# Patient Record
Sex: Female | Born: 1945 | Race: Black or African American | Hispanic: No | Marital: Single | Smoking: Current every day smoker
Health system: Southern US, Community
[De-identification: ages and names within clinical notes are randomized; demographics above are authoritative.]

## PROBLEM LIST (undated history)

## (undated) DIAGNOSIS — F17200 Nicotine dependence, unspecified, uncomplicated: Secondary | ICD-10-CM

## (undated) DIAGNOSIS — I10 Essential (primary) hypertension: Secondary | ICD-10-CM

## (undated) DIAGNOSIS — B159 Hepatitis A without hepatic coma: Secondary | ICD-10-CM

## (undated) DIAGNOSIS — R569 Unspecified convulsions: Secondary | ICD-10-CM

## (undated) DIAGNOSIS — F209 Schizophrenia, unspecified: Secondary | ICD-10-CM

## (undated) DIAGNOSIS — J45909 Unspecified asthma, uncomplicated: Secondary | ICD-10-CM

## (undated) DIAGNOSIS — E119 Type 2 diabetes mellitus without complications: Secondary | ICD-10-CM

## (undated) DIAGNOSIS — B1920 Unspecified viral hepatitis C without hepatic coma: Secondary | ICD-10-CM

## (undated) DIAGNOSIS — I509 Heart failure, unspecified: Secondary | ICD-10-CM

## (undated) DIAGNOSIS — J9690 Respiratory failure, unspecified, unspecified whether with hypoxia or hypercapnia: Secondary | ICD-10-CM

## (undated) DIAGNOSIS — B1910 Unspecified viral hepatitis B without hepatic coma: Secondary | ICD-10-CM

---

## 2001-10-05 ENCOUNTER — Emergency Department (HOSPITAL_COMMUNITY): Admission: EM | Admit: 2001-10-05 | Discharge: 2001-10-05 | Payer: Self-pay | Admitting: *Deleted

## 2003-01-24 ENCOUNTER — Encounter: Payer: Self-pay | Admitting: Emergency Medicine

## 2003-01-24 ENCOUNTER — Inpatient Hospital Stay (HOSPITAL_COMMUNITY): Admission: EM | Admit: 2003-01-24 | Discharge: 2003-02-01 | Payer: Self-pay | Admitting: Psychiatry

## 2003-03-10 ENCOUNTER — Inpatient Hospital Stay (HOSPITAL_COMMUNITY): Admission: AD | Admit: 2003-03-10 | Discharge: 2003-03-18 | Payer: Self-pay | Admitting: Psychiatry

## 2003-07-21 ENCOUNTER — Inpatient Hospital Stay (HOSPITAL_COMMUNITY): Admission: EM | Admit: 2003-07-21 | Discharge: 2003-08-01 | Payer: Self-pay | Admitting: Psychiatry

## 2003-08-08 ENCOUNTER — Emergency Department (HOSPITAL_COMMUNITY): Admission: EM | Admit: 2003-08-08 | Discharge: 2003-08-08 | Payer: Self-pay | Admitting: Emergency Medicine

## 2003-08-09 ENCOUNTER — Emergency Department (HOSPITAL_COMMUNITY): Admission: EM | Admit: 2003-08-09 | Discharge: 2003-08-09 | Payer: Self-pay | Admitting: *Deleted

## 2003-08-12 ENCOUNTER — Emergency Department (HOSPITAL_COMMUNITY): Admission: EM | Admit: 2003-08-12 | Discharge: 2003-08-13 | Payer: Self-pay | Admitting: *Deleted

## 2003-08-28 ENCOUNTER — Emergency Department (HOSPITAL_COMMUNITY): Admission: EM | Admit: 2003-08-28 | Discharge: 2003-08-28 | Payer: Self-pay | Admitting: Emergency Medicine

## 2003-08-31 ENCOUNTER — Emergency Department (HOSPITAL_COMMUNITY): Admission: EM | Admit: 2003-08-31 | Discharge: 2003-09-01 | Payer: Self-pay | Admitting: Emergency Medicine

## 2003-10-07 ENCOUNTER — Emergency Department (HOSPITAL_COMMUNITY): Admission: EM | Admit: 2003-10-07 | Discharge: 2003-10-07 | Payer: Self-pay | Admitting: Emergency Medicine

## 2003-11-10 ENCOUNTER — Emergency Department (HOSPITAL_COMMUNITY): Admission: EM | Admit: 2003-11-10 | Discharge: 2003-11-10 | Payer: Self-pay | Admitting: Emergency Medicine

## 2003-11-11 ENCOUNTER — Emergency Department (HOSPITAL_COMMUNITY): Admission: AD | Admit: 2003-11-11 | Discharge: 2003-11-11 | Payer: Self-pay | Admitting: Emergency Medicine

## 2004-01-11 ENCOUNTER — Emergency Department (HOSPITAL_COMMUNITY): Admission: EM | Admit: 2004-01-11 | Discharge: 2004-01-12 | Payer: Self-pay | Admitting: Emergency Medicine

## 2004-01-13 ENCOUNTER — Emergency Department (HOSPITAL_COMMUNITY): Admission: EM | Admit: 2004-01-13 | Discharge: 2004-01-13 | Payer: Self-pay | Admitting: Emergency Medicine

## 2004-01-14 ENCOUNTER — Emergency Department (HOSPITAL_COMMUNITY): Admission: EM | Admit: 2004-01-14 | Discharge: 2004-01-14 | Payer: Self-pay | Admitting: Emergency Medicine

## 2004-01-25 ENCOUNTER — Emergency Department (HOSPITAL_COMMUNITY): Admission: EM | Admit: 2004-01-25 | Discharge: 2004-01-25 | Payer: Self-pay | Admitting: Emergency Medicine

## 2004-03-24 ENCOUNTER — Emergency Department (HOSPITAL_COMMUNITY): Admission: EM | Admit: 2004-03-24 | Discharge: 2004-03-24 | Payer: Self-pay | Admitting: Emergency Medicine

## 2004-07-05 ENCOUNTER — Emergency Department: Payer: Self-pay | Admitting: Emergency Medicine

## 2004-07-19 ENCOUNTER — Emergency Department: Payer: Self-pay | Admitting: Emergency Medicine

## 2004-08-05 ENCOUNTER — Emergency Department: Payer: Self-pay | Admitting: Emergency Medicine

## 2006-01-16 ENCOUNTER — Emergency Department: Payer: Self-pay | Admitting: Emergency Medicine

## 2006-01-17 ENCOUNTER — Emergency Department: Payer: Self-pay | Admitting: Emergency Medicine

## 2006-01-18 ENCOUNTER — Emergency Department: Payer: Self-pay | Admitting: Emergency Medicine

## 2006-05-03 ENCOUNTER — Inpatient Hospital Stay: Payer: Self-pay | Admitting: Psychiatry

## 2006-05-19 ENCOUNTER — Emergency Department: Payer: Self-pay | Admitting: Internal Medicine

## 2006-07-29 ENCOUNTER — Emergency Department: Payer: Self-pay | Admitting: Emergency Medicine

## 2006-07-30 ENCOUNTER — Emergency Department: Payer: Self-pay | Admitting: Emergency Medicine

## 2006-08-01 ENCOUNTER — Emergency Department: Payer: Self-pay | Admitting: Emergency Medicine

## 2006-08-01 ENCOUNTER — Other Ambulatory Visit: Payer: Self-pay

## 2006-08-04 ENCOUNTER — Other Ambulatory Visit: Payer: Self-pay

## 2006-08-04 ENCOUNTER — Emergency Department: Payer: Self-pay | Admitting: Emergency Medicine

## 2013-01-28 ENCOUNTER — Ambulatory Visit: Payer: Self-pay | Admitting: Internal Medicine

## 2013-02-13 ENCOUNTER — Inpatient Hospital Stay: Payer: Self-pay | Admitting: Family Medicine

## 2013-02-13 LAB — CBC WITH DIFFERENTIAL/PLATELET
Basophil %: 1.4 %
Eosinophil #: 0.1 10*3/uL (ref 0.0–0.7)
HCT: 40.9 % (ref 35.0–47.0)
Lymphocyte #: 1.7 10*3/uL (ref 1.0–3.6)
Lymphocyte %: 41.4 %
MCH: 34.5 pg — ABNORMAL HIGH (ref 26.0–34.0)
MCHC: 33.4 g/dL (ref 32.0–36.0)
MCV: 103 fL — ABNORMAL HIGH (ref 80–100)
Monocyte %: 5.6 %
Neutrophil #: 2 10*3/uL (ref 1.4–6.5)
Neutrophil %: 49.6 %
RBC: 3.97 10*6/uL (ref 3.80–5.20)
RDW: 13.9 % (ref 11.5–14.5)
WBC: 4 10*3/uL (ref 3.6–11.0)

## 2013-02-13 LAB — DRUG SCREEN, URINE
Amphetamines, Ur Screen: NEGATIVE (ref ?–1000)
Barbiturates, Ur Screen: NEGATIVE (ref ?–200)
Cannabinoid 50 Ng, Ur ~~LOC~~: NEGATIVE (ref ?–50)
MDMA (Ecstasy)Ur Screen: NEGATIVE (ref ?–500)
Phencyclidine (PCP) Ur S: NEGATIVE (ref ?–25)
Tricyclic, Ur Screen: NEGATIVE (ref ?–1000)

## 2013-02-13 LAB — COMPREHENSIVE METABOLIC PANEL
Albumin: 2.8 g/dL — ABNORMAL LOW (ref 3.4–5.0)
Alkaline Phosphatase: 73 U/L (ref 50–136)
Anion Gap: 14 (ref 7–16)
Bilirubin,Total: 0.8 mg/dL (ref 0.2–1.0)
Calcium, Total: 8.6 mg/dL (ref 8.5–10.1)
Co2: 22 mmol/L (ref 21–32)
Creatinine: 2.14 mg/dL — ABNORMAL HIGH (ref 0.60–1.30)
EGFR (African American): 27 — ABNORMAL LOW
EGFR (Non-African Amer.): 24 — ABNORMAL LOW
Osmolality: 297 (ref 275–301)
Potassium: 5.3 mmol/L — ABNORMAL HIGH (ref 3.5–5.1)
SGOT(AST): 416 U/L — ABNORMAL HIGH (ref 15–37)
Sodium: 137 mmol/L (ref 136–145)

## 2013-02-13 LAB — BASIC METABOLIC PANEL
Anion Gap: 21 — ABNORMAL HIGH (ref 7–16)
BUN: 56 mg/dL — ABNORMAL HIGH (ref 7–18)
Chloride: 94 mmol/L — ABNORMAL LOW (ref 98–107)
Co2: 12 mmol/L — ABNORMAL LOW (ref 21–32)
Creatinine: 2.06 mg/dL — ABNORMAL HIGH (ref 0.60–1.30)
EGFR (Non-African Amer.): 25 — ABNORMAL LOW
Glucose: 698 mg/dL (ref 65–99)
Potassium: 4.2 mmol/L (ref 3.5–5.1)
Sodium: 127 mmol/L — ABNORMAL LOW (ref 136–145)

## 2013-02-13 LAB — URINALYSIS, COMPLETE
Bacteria: NONE SEEN
Bilirubin,UR: NEGATIVE
Blood: NEGATIVE
Glucose,UR: NEGATIVE mg/dL (ref 0–75)
Hyaline Cast: 1
Ph: 5 (ref 4.5–8.0)
Protein: NEGATIVE
WBC UR: <1 /HPF (ref 0–5)

## 2013-02-13 LAB — TROPONIN I: Troponin-I: <0.02 ng/mL

## 2013-02-13 LAB — TSH: Thyroid Stimulating Horm: 2.15 u[IU]/mL

## 2013-02-14 DIAGNOSIS — I369 Nonrheumatic tricuspid valve disorder, unspecified: Secondary | ICD-10-CM

## 2013-02-14 LAB — CBC WITH DIFFERENTIAL/PLATELET
Basophil #: 0 10*3/uL (ref 0.0–0.1)
Basophil %: 0.1 %
Eosinophil #: 0 10*3/uL (ref 0.0–0.7)
HGB: 13.2 g/dL (ref 12.0–16.0)
Lymphocyte #: 0.5 10*3/uL — ABNORMAL LOW (ref 1.0–3.6)
Lymphocyte %: 10.4 %
MCH: 34 pg (ref 26.0–34.0)
MCHC: 32 g/dL (ref 32.0–36.0)
Monocyte #: 0.3 x10 3/mm (ref 0.2–0.9)
Neutrophil %: 83 %
RBC: 3.9 10*6/uL (ref 3.80–5.20)
RDW: 14 % (ref 11.5–14.5)
WBC: 4.8 10*3/uL (ref 3.6–11.0)

## 2013-02-14 LAB — COMPREHENSIVE METABOLIC PANEL
Anion Gap: 18 — ABNORMAL HIGH (ref 7–16)
BUN: 60 mg/dL — ABNORMAL HIGH (ref 7–18)
Bilirubin,Total: 0.7 mg/dL (ref 0.2–1.0)
Calcium, Total: 6.3 mg/dL — CL (ref 8.5–10.1)
Chloride: 89 mmol/L — ABNORMAL LOW (ref 98–107)
Creatinine: 2 mg/dL — ABNORMAL HIGH (ref 0.60–1.30)
EGFR (African American): 30 — ABNORMAL LOW
Osmolality: 310 (ref 275–301)
Potassium: 3.5 mmol/L (ref 3.5–5.1)
SGPT (ALT): 139 U/L — ABNORMAL HIGH (ref 12–78)
Sodium: 124 mmol/L — ABNORMAL LOW (ref 136–145)

## 2013-02-14 LAB — LIPID PANEL: Cholesterol: 120 mg/dL (ref 0–200)

## 2013-02-14 LAB — BASIC METABOLIC PANEL
BUN: 45 mg/dL — ABNORMAL HIGH (ref 7–18)
Calcium, Total: 6.5 mg/dL — CL (ref 8.5–10.1)
Chloride: 87 mmol/L — ABNORMAL LOW (ref 98–107)
Co2: 27 mmol/L (ref 21–32)
Creatinine: 1.42 mg/dL — ABNORMAL HIGH (ref 0.60–1.30)
EGFR (African American): 45 — ABNORMAL LOW
Glucose: 194 mg/dL — ABNORMAL HIGH (ref 65–99)
Potassium: 3.1 mmol/L — ABNORMAL LOW (ref 3.5–5.1)

## 2013-02-14 LAB — HEMOGLOBIN A1C: Hemoglobin A1C: 6.7 % — ABNORMAL HIGH (ref 4.2–6.3)

## 2013-02-14 LAB — GLUCOSE, RANDOM
Glucose: 862 mg/dL (ref 65–99)
Glucose: 730 mg/dL (ref 65–99)

## 2013-02-14 LAB — PHOSPHORUS: Phosphorus: 2.3 mg/dL — ABNORMAL LOW (ref 2.5–4.9)

## 2013-02-14 LAB — MAGNESIUM: Magnesium: 0.7 mg/dL — ABNORMAL LOW

## 2013-02-14 LAB — VALPROIC ACID LEVEL: Valproic Acid: 9 ug/mL — ABNORMAL LOW

## 2013-02-15 LAB — COMPREHENSIVE METABOLIC PANEL
Alkaline Phosphatase: 54 U/L (ref 50–136)
Anion Gap: 10 (ref 7–16)
BUN: 37 mg/dL — ABNORMAL HIGH (ref 7–18)
Calcium, Total: 7 mg/dL — CL (ref 8.5–10.1)
Co2: 28 mmol/L (ref 21–32)
Creatinine: 0.95 mg/dL (ref 0.60–1.30)
EGFR (Non-African Amer.): >60
Potassium: 2.6 mmol/L — ABNORMAL LOW (ref 3.5–5.1)
SGOT(AST): 184 U/L — ABNORMAL HIGH (ref 15–37)
SGPT (ALT): 95 U/L — ABNORMAL HIGH (ref 12–78)
Sodium: 124 mmol/L — ABNORMAL LOW (ref 136–145)
Total Protein: 4.7 g/dL — ABNORMAL LOW (ref 6.4–8.2)

## 2013-02-15 LAB — MAGNESIUM: Magnesium: 1.2 mg/dL — ABNORMAL LOW

## 2013-02-15 LAB — CBC WITH DIFFERENTIAL/PLATELET
Basophil #: 0 10*3/uL (ref 0.0–0.1)
Basophil %: 0.2 %
Eosinophil %: 0.1 %
HGB: 10.9 g/dL — ABNORMAL LOW (ref 12.0–16.0)
Lymphocyte #: 1.3 10*3/uL (ref 1.0–3.6)
Lymphocyte %: 17.4 %
MCH: 34.4 pg — ABNORMAL HIGH (ref 26.0–34.0)
MCV: 99 fL (ref 80–100)
Monocyte #: 0.7 x10 3/mm (ref 0.2–0.9)
Neutrophil #: 5.2 10*3/uL (ref 1.4–6.5)
Neutrophil %: 72.1 %
Platelet: 121 10*3/uL — ABNORMAL LOW (ref 150–440)
RBC: 3.16 10*6/uL — ABNORMAL LOW (ref 3.80–5.20)
RDW: 13.6 % (ref 11.5–14.5)

## 2013-02-15 LAB — PHOSPHORUS: Phosphorus: 1.9 mg/dL — ABNORMAL LOW (ref 2.5–4.9)

## 2013-02-15 LAB — URINE CULTURE

## 2013-02-16 LAB — CBC WITH DIFFERENTIAL/PLATELET
Eosinophil #: 0 10*3/uL (ref 0.0–0.7)
Eosinophil %: 0.1 %
HCT: 29.7 % — ABNORMAL LOW (ref 35.0–47.0)
Lymphocyte #: 1.1 10*3/uL (ref 1.0–3.6)
Lymphocyte %: 16.3 %
MCV: 99 fL (ref 80–100)
Monocyte #: 0.5 x10 3/mm (ref 0.2–0.9)
Monocyte %: 6.8 %
Neutrophil %: 76.6 %
Platelet: 121 10*3/uL — ABNORMAL LOW (ref 150–440)
RDW: 13.4 % (ref 11.5–14.5)

## 2013-02-16 LAB — BASIC METABOLIC PANEL
Anion Gap: 9 (ref 7–16)
BUN: 30 mg/dL — ABNORMAL HIGH (ref 7–18)
Calcium, Total: 7.3 mg/dL — ABNORMAL LOW (ref 8.5–10.1)
Chloride: 88 mmol/L — ABNORMAL LOW (ref 98–107)
Co2: 27 mmol/L (ref 21–32)
Creatinine: 0.78 mg/dL (ref 0.60–1.30)
EGFR (African American): >60
EGFR (Non-African Amer.): >60
Osmolality: 262 (ref 275–301)
Sodium: 124 mmol/L — ABNORMAL LOW (ref 136–145)

## 2013-02-16 LAB — MAGNESIUM: Magnesium: 1.2 mg/dL — ABNORMAL LOW

## 2013-02-16 LAB — PHOSPHORUS: Phosphorus: 1.7 mg/dL — ABNORMAL LOW (ref 2.5–4.9)

## 2013-02-17 LAB — BASIC METABOLIC PANEL
Anion Gap: 9 (ref 7–16)
BUN: 21 mg/dL — ABNORMAL HIGH (ref 7–18)
Chloride: 105 mmol/L (ref 98–107)
Co2: 27 mmol/L (ref 21–32)
Creatinine: 0.78 mg/dL (ref 0.60–1.30)
EGFR (Non-African Amer.): >60
Glucose: 156 mg/dL — ABNORMAL HIGH (ref 65–99)
Potassium: 2.8 mmol/L — ABNORMAL LOW (ref 3.5–5.1)
Sodium: 141 mmol/L (ref 136–145)

## 2013-02-17 LAB — CBC WITH DIFFERENTIAL/PLATELET
Basophil %: 0.2 %
Eosinophil #: 0 10*3/uL (ref 0.0–0.7)
Eosinophil %: 0.1 %
HGB: 11 g/dL — ABNORMAL LOW (ref 12.0–16.0)
Lymphocyte #: 1 10*3/uL (ref 1.0–3.6)
Lymphocyte %: 11.7 %
MCHC: 35.2 g/dL (ref 32.0–36.0)
Monocyte %: 6 %
Neutrophil #: 7.3 10*3/uL — ABNORMAL HIGH (ref 1.4–6.5)
RDW: 13.7 % (ref 11.5–14.5)

## 2013-02-17 LAB — MAGNESIUM: Magnesium: 1.9 mg/dL

## 2013-02-17 LAB — EXPECTORATED SPUTUM ASSESSMENT W GRAM STAIN, RFLX TO RESP C

## 2013-02-17 LAB — PHOSPHORUS: Phosphorus: 0.9 mg/dL — CL (ref 2.5–4.9)

## 2013-02-18 LAB — CBC WITH DIFFERENTIAL/PLATELET
Comment - H1-Com3: NORMAL
HGB: 10.4 g/dL — ABNORMAL LOW (ref 12.0–16.0)
Lymphocytes: 34 %
MCHC: 35.1 g/dL (ref 32.0–36.0)
MCV: 100 fL (ref 80–100)
Platelet: 159 10*3/uL (ref 150–440)
RBC: 2.96 10*6/uL — ABNORMAL LOW (ref 3.80–5.20)
RDW: 14.3 % (ref 11.5–14.5)
WBC: 7.5 10*3/uL (ref 3.6–11.0)

## 2013-02-18 LAB — COMPREHENSIVE METABOLIC PANEL
Alkaline Phosphatase: 83 U/L (ref 50–136)
Anion Gap: 7 (ref 7–16)
BUN: 22 mg/dL — ABNORMAL HIGH (ref 7–18)
Co2: 28 mmol/L (ref 21–32)
Creatinine: 0.7 mg/dL (ref 0.60–1.30)
EGFR (Non-African Amer.): >60
Osmolality: 299 (ref 275–301)
Potassium: 3.4 mmol/L — ABNORMAL LOW (ref 3.5–5.1)
Sodium: 147 mmol/L — ABNORMAL HIGH (ref 136–145)
Total Protein: 4.9 g/dL — ABNORMAL LOW (ref 6.4–8.2)

## 2013-02-18 LAB — MAGNESIUM
Magnesium: 1.5 mg/dL — ABNORMAL LOW
Magnesium: 1.5 mg/dL — ABNORMAL LOW

## 2013-02-18 LAB — PHOSPHORUS
Phosphorus: 1.5 mg/dL — ABNORMAL LOW (ref 2.5–4.9)
Phosphorus: 1.7 mg/dL — ABNORMAL LOW (ref 2.5–4.9)
Phosphorus: 3.4 mg/dL (ref 2.5–4.9)

## 2013-02-18 LAB — POTASSIUM
Potassium: 3.4 mmol/L — ABNORMAL LOW (ref 3.5–5.1)
Potassium: 4.4 mmol/L (ref 3.5–5.1)

## 2013-02-19 LAB — BASIC METABOLIC PANEL
Anion Gap: 5 — ABNORMAL LOW (ref 7–16)
BUN: 23 mg/dL — ABNORMAL HIGH (ref 7–18)
Calcium, Total: 7.6 mg/dL — ABNORMAL LOW (ref 8.5–10.1)
Chloride: 113 mmol/L — ABNORMAL HIGH (ref 98–107)
Co2: 29 mmol/L (ref 21–32)
Creatinine: 0.66 mg/dL (ref 0.60–1.30)
Glucose: 152 mg/dL — ABNORMAL HIGH (ref 65–99)
Potassium: 4.2 mmol/L (ref 3.5–5.1)

## 2013-02-19 LAB — CULTURE, BLOOD (SINGLE)

## 2013-02-19 LAB — CBC WITH DIFFERENTIAL/PLATELET
Basophil #: 0 10*3/uL (ref 0.0–0.1)
Basophil %: 0.3 %
Eosinophil %: 1.1 %
HCT: 27 % — ABNORMAL LOW (ref 35.0–47.0)
HGB: 9.3 g/dL — ABNORMAL LOW (ref 12.0–16.0)
Lymphocyte %: 41 %
MCH: 35 pg — ABNORMAL HIGH (ref 26.0–34.0)
Monocyte #: 0.9 x10 3/mm (ref 0.2–0.9)
Monocyte %: 13.8 %
RBC: 2.65 10*6/uL — ABNORMAL LOW (ref 3.80–5.20)
WBC: 6.6 10*3/uL (ref 3.6–11.0)

## 2013-02-19 LAB — ALBUMIN: Albumin: 1.9 g/dL — ABNORMAL LOW (ref 3.4–5.0)

## 2013-02-19 LAB — PHOSPHORUS: Phosphorus: 2.7 mg/dL (ref 2.5–4.9)

## 2013-02-20 LAB — CBC WITH DIFFERENTIAL/PLATELET
HGB: 9.3 g/dL — ABNORMAL LOW (ref 12.0–16.0)
Lymphocytes: 42 %
MCHC: 34.1 g/dL (ref 32.0–36.0)
MCV: 101 fL — ABNORMAL HIGH (ref 80–100)
Monocytes: 7 %
Myelocyte: 1 %
RBC: 2.68 10*6/uL — ABNORMAL LOW (ref 3.80–5.20)
WBC: 9.1 10*3/uL (ref 3.6–11.0)

## 2013-02-20 LAB — BASIC METABOLIC PANEL
Anion Gap: 6 — ABNORMAL LOW (ref 7–16)
BUN: 20 mg/dL — ABNORMAL HIGH (ref 7–18)
Calcium, Total: 8 mg/dL — ABNORMAL LOW (ref 8.5–10.1)
Co2: 28 mmol/L (ref 21–32)
Creatinine: 0.66 mg/dL (ref 0.60–1.30)
EGFR (African American): >60

## 2013-02-20 LAB — PHENYTOIN LEVEL, TOTAL: Dilantin: 13.5 ug/mL (ref 10.0–20.0)

## 2013-02-21 DIAGNOSIS — I498 Other specified cardiac arrhythmias: Secondary | ICD-10-CM

## 2013-02-21 LAB — BASIC METABOLIC PANEL
Anion Gap: 4 — ABNORMAL LOW (ref 7–16)
Calcium, Total: 7.9 mg/dL — ABNORMAL LOW (ref 8.5–10.1)
Chloride: 109 mmol/L — ABNORMAL HIGH (ref 98–107)
Creatinine: 0.6 mg/dL (ref 0.60–1.30)
EGFR (African American): >60
EGFR (Non-African Amer.): >60
Glucose: 135 mg/dL — ABNORMAL HIGH (ref 65–99)
Sodium: 143 mmol/L (ref 136–145)

## 2013-02-21 LAB — URINALYSIS, COMPLETE
Bacteria: NONE SEEN
Glucose,UR: 50 mg/dL (ref 0–75)
Leukocyte Esterase: NEGATIVE
Protein: NEGATIVE
RBC,UR: 1 /HPF (ref 0–5)
Specific Gravity: 1.011 (ref 1.003–1.030)
Squamous Epithelial: <1
WBC UR: 1 /HPF (ref 0–5)

## 2013-02-21 LAB — PHOSPHORUS: Phosphorus: 2.7 mg/dL (ref 2.5–4.9)

## 2013-02-21 LAB — MAGNESIUM: Magnesium: 1 mg/dL — ABNORMAL LOW

## 2013-02-22 LAB — CBC WITH DIFFERENTIAL/PLATELET
Basophil #: 0.1 10*3/uL (ref 0.0–0.1)
Basophil %: 1.2 %
Eosinophil #: 0.1 10*3/uL (ref 0.0–0.7)
Eosinophil %: 1 %
HCT: 26.5 % — ABNORMAL LOW (ref 35.0–47.0)
Lymphocyte #: 3.1 10*3/uL (ref 1.0–3.6)
Lymphocyte %: 31 %
MCHC: 34.8 g/dL (ref 32.0–36.0)
MCV: 102 fL — ABNORMAL HIGH (ref 80–100)
Monocyte #: 1 x10 3/mm — ABNORMAL HIGH (ref 0.2–0.9)
Monocyte %: 10.2 %
Neutrophil #: 5.7 10*3/uL (ref 1.4–6.5)
Neutrophil %: 56.6 %
Platelet: 264 10*3/uL (ref 150–440)

## 2013-02-22 LAB — IRON AND TIBC
Iron Bind.Cap.(Total): 181 ug/dL — ABNORMAL LOW (ref 250–450)
Iron: 60 ug/dL (ref 50–170)
Unbound Iron-Bind.Cap.: 121 ug/dL

## 2013-02-22 LAB — FOLATE: Folic Acid: 14 ng/mL (ref 3.1–100.0)

## 2013-02-22 LAB — BASIC METABOLIC PANEL
Anion Gap: 5 — ABNORMAL LOW (ref 7–16)
Chloride: 109 mmol/L — ABNORMAL HIGH (ref 98–107)
EGFR (Non-African Amer.): >60
Osmolality: 281 (ref 275–301)
Potassium: 4.3 mmol/L (ref 3.5–5.1)
Sodium: 141 mmol/L (ref 136–145)

## 2013-02-22 LAB — MAGNESIUM: Magnesium: 1.2 mg/dL — ABNORMAL LOW

## 2013-02-22 LAB — URINE CULTURE

## 2013-02-23 LAB — BASIC METABOLIC PANEL
Anion Gap: 6 — ABNORMAL LOW (ref 7–16)
Calcium, Total: 8.5 mg/dL (ref 8.5–10.1)
Chloride: 108 mmol/L — ABNORMAL HIGH (ref 98–107)
Co2: 25 mmol/L (ref 21–32)
Creatinine: 0.62 mg/dL (ref 0.60–1.30)
EGFR (Non-African Amer.): >60
Glucose: 132 mg/dL — ABNORMAL HIGH (ref 65–99)
Osmolality: 278 (ref 275–301)
Potassium: 4.4 mmol/L (ref 3.5–5.1)

## 2013-02-23 LAB — CBC WITH DIFFERENTIAL/PLATELET
Basophil #: 0.1 10*3/uL (ref 0.0–0.1)
Eosinophil #: 0.1 10*3/uL (ref 0.0–0.7)
Eosinophil %: 1.1 %
HCT: 26.6 % — ABNORMAL LOW (ref 35.0–47.0)
HGB: 9.1 g/dL — ABNORMAL LOW (ref 12.0–16.0)
Lymphocyte #: 3.1 10*3/uL (ref 1.0–3.6)
Lymphocyte %: 32 %
MCH: 35.1 pg — ABNORMAL HIGH (ref 26.0–34.0)
Monocyte #: 1 x10 3/mm — ABNORMAL HIGH (ref 0.2–0.9)
Monocyte %: 10.2 %
Neutrophil #: 5.5 10*3/uL (ref 1.4–6.5)
Platelet: 294 10*3/uL (ref 150–440)
RBC: 2.6 10*6/uL — ABNORMAL LOW (ref 3.80–5.20)

## 2013-02-23 LAB — MAGNESIUM: Magnesium: 1.3 mg/dL — ABNORMAL LOW

## 2013-02-24 LAB — CBC WITH DIFFERENTIAL/PLATELET
Basophil #: 0 10*3/uL (ref 0.0–0.1)
Eosinophil #: 0.1 10*3/uL (ref 0.0–0.7)
Eosinophil %: 1.4 %
HCT: 27.1 % — ABNORMAL LOW (ref 35.0–47.0)
HGB: 9.2 g/dL — ABNORMAL LOW (ref 12.0–16.0)
Lymphocyte #: 2.5 10*3/uL (ref 1.0–3.6)
Lymphocyte %: 34.1 %
MCHC: 34 g/dL (ref 32.0–36.0)
MCV: 102 fL — ABNORMAL HIGH (ref 80–100)
Monocyte %: 9.5 %
Platelet: 331 10*3/uL (ref 150–440)

## 2013-02-24 LAB — BASIC METABOLIC PANEL
Anion Gap: 4 — ABNORMAL LOW (ref 7–16)
Chloride: 110 mmol/L — ABNORMAL HIGH (ref 98–107)
Creatinine: 0.51 mg/dL — ABNORMAL LOW (ref 0.60–1.30)
EGFR (African American): >60
Glucose: 121 mg/dL — ABNORMAL HIGH (ref 65–99)
Potassium: 4.6 mmol/L (ref 3.5–5.1)
Sodium: 140 mmol/L (ref 136–145)

## 2013-02-24 LAB — PHENYTOIN LEVEL, TOTAL: Dilantin: 8.6 ug/mL — ABNORMAL LOW (ref 10.0–20.0)

## 2013-02-24 LAB — ALBUMIN: Albumin: 2.1 g/dL — ABNORMAL LOW (ref 3.4–5.0)

## 2013-02-25 LAB — CULTURE, BLOOD (SINGLE)

## 2013-02-27 ENCOUNTER — Ambulatory Visit: Payer: Self-pay | Admitting: Internal Medicine

## 2013-03-28 ENCOUNTER — Inpatient Hospital Stay (HOSPITAL_COMMUNITY)
Admission: EM | Admit: 2013-03-28 | Discharge: 2013-04-02 | DRG: 100 | Disposition: A | Discharge status: Skilled Nursing Facility | Payer: Medicare Other | Attending: Pulmonary Disease | Admitting: Pulmonary Disease

## 2013-03-28 ENCOUNTER — Emergency Department (HOSPITAL_COMMUNITY): Payer: Medicare Other

## 2013-03-28 ENCOUNTER — Encounter (HOSPITAL_COMMUNITY): Payer: Self-pay

## 2013-03-28 ENCOUNTER — Inpatient Hospital Stay (HOSPITAL_COMMUNITY): Payer: Medicare Other

## 2013-03-28 ENCOUNTER — Other Ambulatory Visit: Payer: Self-pay

## 2013-03-28 DIAGNOSIS — G40901 Epilepsy, unspecified, not intractable, with status epilepticus: Secondary | ICD-10-CM | POA: Diagnosis present

## 2013-03-28 DIAGNOSIS — J449 Chronic obstructive pulmonary disease, unspecified: Secondary | ICD-10-CM | POA: Diagnosis present

## 2013-03-28 DIAGNOSIS — B159 Hepatitis A without hepatic coma: Secondary | ICD-10-CM | POA: Diagnosis present

## 2013-03-28 DIAGNOSIS — B1910 Unspecified viral hepatitis B without hepatic coma: Secondary | ICD-10-CM | POA: Diagnosis present

## 2013-03-28 DIAGNOSIS — E119 Type 2 diabetes mellitus without complications: Secondary | ICD-10-CM | POA: Diagnosis present

## 2013-03-28 DIAGNOSIS — Z794 Long term (current) use of insulin: Secondary | ICD-10-CM

## 2013-03-28 DIAGNOSIS — F209 Schizophrenia, unspecified: Secondary | ICD-10-CM | POA: Diagnosis present

## 2013-03-28 DIAGNOSIS — D649 Anemia, unspecified: Secondary | ICD-10-CM | POA: Diagnosis present

## 2013-03-28 DIAGNOSIS — J9600 Acute respiratory failure, unspecified whether with hypoxia or hypercapnia: Secondary | ICD-10-CM | POA: Diagnosis present

## 2013-03-28 DIAGNOSIS — G40401 Other generalized epilepsy and epileptic syndromes, not intractable, with status epilepticus: Principal | ICD-10-CM

## 2013-03-28 DIAGNOSIS — J4489 Other specified chronic obstructive pulmonary disease: Secondary | ICD-10-CM | POA: Diagnosis present

## 2013-03-28 DIAGNOSIS — G9340 Encephalopathy, unspecified: Secondary | ICD-10-CM | POA: Diagnosis present

## 2013-03-28 DIAGNOSIS — R4182 Altered mental status, unspecified: Secondary | ICD-10-CM

## 2013-03-28 DIAGNOSIS — F17200 Nicotine dependence, unspecified, uncomplicated: Secondary | ICD-10-CM | POA: Diagnosis present

## 2013-03-28 DIAGNOSIS — E876 Hypokalemia: Secondary | ICD-10-CM | POA: Diagnosis not present

## 2013-03-28 DIAGNOSIS — E871 Hypo-osmolality and hyponatremia: Secondary | ICD-10-CM | POA: Diagnosis present

## 2013-03-28 DIAGNOSIS — B1920 Unspecified viral hepatitis C without hepatic coma: Secondary | ICD-10-CM | POA: Diagnosis present

## 2013-03-28 DIAGNOSIS — I10 Essential (primary) hypertension: Secondary | ICD-10-CM | POA: Diagnosis present

## 2013-03-28 HISTORY — DX: Unspecified asthma, uncomplicated: J45.909

## 2013-03-28 HISTORY — DX: Respiratory failure, unspecified, unspecified whether with hypoxia or hypercapnia: J96.90

## 2013-03-28 HISTORY — DX: Unspecified viral hepatitis C without hepatic coma: B19.20

## 2013-03-28 HISTORY — DX: Hepatitis a without hepatic coma: B15.9

## 2013-03-28 HISTORY — DX: Unspecified viral hepatitis B without hepatic coma: B19.10

## 2013-03-28 HISTORY — DX: Unspecified convulsions: R56.9

## 2013-03-28 HISTORY — DX: Heart failure, unspecified: I50.9

## 2013-03-28 HISTORY — DX: Type 2 diabetes mellitus without complications: E11.9

## 2013-03-28 HISTORY — DX: Essential (primary) hypertension: I10

## 2013-03-28 HISTORY — DX: Nicotine dependence, unspecified, uncomplicated: F17.200

## 2013-03-28 HISTORY — DX: Schizophrenia, unspecified: F20.9

## 2013-03-28 LAB — CBC WITH DIFFERENTIAL/PLATELET
Lymphocytes Relative: 38 % (ref 12–46)
MCH: 34.5 pg — ABNORMAL HIGH (ref 26.0–34.0)
Monocytes Absolute: 0.5 10*3/uL (ref 0.1–1.0)
Monocytes Relative: 7 % (ref 3–12)
Neutro Abs: 4 10*3/uL (ref 1.7–7.7)
Neutrophils Relative %: 55 % (ref 43–77)
Platelets: 239 10*3/uL (ref 150–400)
RBC: 3.16 MIL/uL — ABNORMAL LOW (ref 3.87–5.11)
RDW: 15 % (ref 11.5–15.5)

## 2013-03-28 LAB — POCT I-STAT 3, ART BLOOD GAS (G3+)
Acid-base deficit: 1 mmol/L (ref 0.0–2.0)
Patient temperature: 97.7
pO2, Arterial: 424 mmHg — ABNORMAL HIGH (ref 80.0–100.0)

## 2013-03-28 LAB — URINALYSIS, ROUTINE W REFLEX MICROSCOPIC
Bilirubin Urine: NEGATIVE
Glucose, UA: 250 mg/dL — AB
Hgb urine dipstick: NEGATIVE
Nitrite: NEGATIVE
Specific Gravity, Urine: 1.017 (ref 1.005–1.030)
pH: 7 (ref 5.0–8.0)

## 2013-03-28 LAB — COMPREHENSIVE METABOLIC PANEL
Albumin: 3.1 g/dL — ABNORMAL LOW (ref 3.5–5.2)
BUN: 11 mg/dL (ref 6–23)
Chloride: 99 mEq/L (ref 96–112)
Creatinine, Ser: 0.48 mg/dL — ABNORMAL LOW (ref 0.50–1.10)
Total Bilirubin: 0.1 mg/dL — ABNORMAL LOW (ref 0.3–1.2)

## 2013-03-28 LAB — GLUCOSE, CAPILLARY
Glucose-Capillary: 59 mg/dL — ABNORMAL LOW (ref 70–99)
Glucose-Capillary: 118 mg/dL — ABNORMAL HIGH (ref 70–99)
Glucose-Capillary: 71 mg/dL (ref 70–99)
Glucose-Capillary: 137 mg/dL — ABNORMAL HIGH (ref 70–99)

## 2013-03-28 LAB — BASIC METABOLIC PANEL
Calcium: 9.1 mg/dL (ref 8.4–10.5)
GFR calc Af Amer: >90 mL/min (ref >90)
GFR calc non Af Amer: >90 mL/min (ref >90)
Glucose, Bld: 101 mg/dL — ABNORMAL HIGH (ref 70–99)
Sodium: 136 mEq/L (ref 135–145)

## 2013-03-28 LAB — PHENYTOIN LEVEL, TOTAL
Phenytoin Lvl: 4.9 ug/mL — ABNORMAL LOW (ref 10.0–20.0)
Phenytoin Lvl: 11.1 ug/mL (ref 10.0–20.0)

## 2013-03-28 LAB — VALPROIC ACID LEVEL: Valproic Acid Lvl: <10 ug/mL — ABNORMAL LOW (ref 50.0–100.0)

## 2013-03-28 LAB — CBC
MCH: 34 pg (ref 26.0–34.0)
Platelets: 200 10*3/uL (ref 150–400)
RBC: 3.09 MIL/uL — ABNORMAL LOW (ref 3.87–5.11)
WBC: 9.7 10*3/uL (ref 4.0–10.5)

## 2013-03-28 MED ORDER — LAMOTRIGINE 150 MG PO TABS
150.0000 mg | ORAL_TABLET | Freq: Every day | ORAL | Status: DC
  Filled 2013-03-28: qty 1

## 2013-03-28 MED ORDER — SUCCINYLCHOLINE CHLORIDE 20 MG/ML IJ SOLN
INTRAMUSCULAR | Status: AC
  Filled 2013-03-28: qty 1

## 2013-03-28 MED ORDER — DEXTROSE 50 % IV SOLN
50.0000 mL | Freq: Once | INTRAVENOUS | Status: AC | PRN
  Administered 2013-03-28: 50 mL via INTRAVENOUS

## 2013-03-28 MED ORDER — SODIUM CHLORIDE 0.9 % IV SOLN
500.0000 mg | INTRAVENOUS | Status: AC
  Administered 2013-03-28: 500 mg via INTRAVENOUS
  Filled 2013-03-28: qty 10

## 2013-03-28 MED ORDER — SODIUM CHLORIDE 0.9 % IV BOLUS (SEPSIS)
1000.0000 mL | Freq: Once | INTRAVENOUS | Status: AC
  Administered 2013-03-28: 1000 mL via INTRAVENOUS

## 2013-03-28 MED ORDER — ASPIRIN EC 81 MG PO TBEC
81.0000 mg | DELAYED_RELEASE_TABLET | Freq: Every day | ORAL | Status: DC
  Filled 2013-03-28: qty 1

## 2013-03-28 MED ORDER — DEXTROSE 50 % IV SOLN
INTRAVENOUS | Status: AC
  Filled 2013-03-28: qty 50

## 2013-03-28 MED ORDER — LORAZEPAM 2 MG/ML IJ SOLN: 1.0000 mg | INTRAMUSCULAR | Status: DC | PRN

## 2013-03-28 MED ORDER — VITAL AF 1.2 CAL PO LIQD
1000.0000 mL | ORAL | Status: DC
  Administered 2013-03-28: 1000 mL
  Filled 2013-03-28 (×2): qty 1000

## 2013-03-28 MED ORDER — LAMOTRIGINE 150 MG PO TABS
150.0000 mg | ORAL_TABLET | Freq: Every day | ORAL | Status: DC
  Administered 2013-03-28: 150 mg
  Filled 2013-03-28 (×2): qty 1

## 2013-03-28 MED ORDER — ETOMIDATE 2 MG/ML IV SOLN
INTRAVENOUS | Status: AC
  Filled 2013-03-28: qty 20

## 2013-03-28 MED ORDER — SODIUM CHLORIDE 0.9 % IV SOLN
1000.0000 mg | Freq: Once | INTRAVENOUS | Status: AC
  Administered 2013-03-28: 1000 mg via INTRAVENOUS
  Filled 2013-03-28: qty 10

## 2013-03-28 MED ORDER — PROPOFOL 10 MG/ML IV EMUL
5.0000 ug/kg/min | INTRAVENOUS | Status: DC
  Administered 2013-03-28: 45 ug/kg/min via INTRAVENOUS
  Administered 2013-03-28: 55 ug/kg/min via INTRAVENOUS
  Administered 2013-03-28 (×2): 50 ug/kg/min via INTRAVENOUS
  Administered 2013-03-29: 48 ug/kg/min via INTRAVENOUS
  Administered 2013-03-29 (×2): 55 ug/kg/min via INTRAVENOUS
  Filled 2013-03-28 (×6): qty 100

## 2013-03-28 MED ORDER — PANTOPRAZOLE SODIUM 40 MG IV SOLR
40.0000 mg | INTRAVENOUS | Status: DC
  Administered 2013-03-28 – 2013-03-29 (×2): 40 mg via INTRAVENOUS
  Filled 2013-03-28 (×3): qty 40

## 2013-03-28 MED ORDER — ACETAMINOPHEN 325 MG PO TABS: 650.0000 mg | ORAL_TABLET | Freq: Four times a day (QID) | ORAL | Status: DC | PRN

## 2013-03-28 MED ORDER — ASPIRIN 81 MG PO CHEW
81.0000 mg | CHEWABLE_TABLET | Freq: Every day | ORAL | Status: DC
  Administered 2013-03-28 – 2013-03-29 (×2): 81 mg
  Filled 2013-03-28 (×2): qty 1

## 2013-03-28 MED ORDER — PROPOFOL 10 MG/ML IV EMUL
INTRAVENOUS | Status: AC
  Filled 2013-03-28: qty 100

## 2013-03-28 MED ORDER — SODIUM CHLORIDE 0.9 % IV SOLN
INTRAVENOUS | Status: DC
  Administered 2013-03-28: 05:00:00 via INTRAVENOUS

## 2013-03-28 MED ORDER — SODIUM CHLORIDE 0.9 % IV SOLN
200.0000 mg | Freq: Three times a day (TID) | INTRAVENOUS | Status: DC
  Administered 2013-03-28 – 2013-03-29 (×4): 200 mg via INTRAVENOUS
  Filled 2013-03-28 (×10): qty 4

## 2013-03-28 MED ORDER — BIOTENE DRY MOUTH MT LIQD
1.0000 "application " | Freq: Four times a day (QID) | OROMUCOSAL | Status: DC
  Administered 2013-03-28 – 2013-03-29 (×6): 15 mL via OROMUCOSAL

## 2013-03-28 MED ORDER — INSULIN ASPART 100 UNIT/ML ~~LOC~~ SOLN
0.0000 [IU] | SUBCUTANEOUS | Status: DC
  Administered 2013-03-28: 2 [IU] via SUBCUTANEOUS
  Administered 2013-03-28: 3 [IU] via SUBCUTANEOUS
  Administered 2013-03-28 – 2013-03-29 (×4): 2 [IU] via SUBCUTANEOUS
  Filled 2013-03-28: qty 1

## 2013-03-28 MED ORDER — PROPOFOL 10 MG/ML IV EMUL
5.0000 ug/kg/min | INTRAVENOUS | Status: DC
  Administered 2013-03-28: 35 ug/kg/min via INTRAVENOUS

## 2013-03-28 MED ORDER — CHLORHEXIDINE GLUCONATE 0.12 % MT SOLN
15.0000 mL | Freq: Two times a day (BID) | OROMUCOSAL | Status: DC
  Administered 2013-03-28 – 2013-03-29 (×3): 15 mL via OROMUCOSAL
  Filled 2013-03-28 (×4): qty 15

## 2013-03-28 MED ORDER — HEPARIN SODIUM (PORCINE) 5000 UNIT/ML IJ SOLN
5000.0000 [IU] | Freq: Three times a day (TID) | INTRAMUSCULAR | Status: DC
  Administered 2013-03-28 – 2013-03-29 (×4): 5000 [IU] via SUBCUTANEOUS
  Filled 2013-03-28 (×7): qty 1

## 2013-03-28 MED ORDER — VITAL AF 1.2 CAL PO LIQD
1000.0000 mL | ORAL | Status: DC
  Administered 2013-03-28: 1000 mL
  Filled 2013-03-28 (×3): qty 1000

## 2013-03-28 MED ORDER — ROCURONIUM BROMIDE 50 MG/5ML IV SOLN
INTRAVENOUS | Status: AC
  Filled 2013-03-28: qty 2

## 2013-03-28 MED ORDER — SODIUM CHLORIDE 0.45 % IV SOLN
INTRAVENOUS | Status: DC
  Administered 2013-03-28 – 2013-03-29 (×2): via INTRAVENOUS

## 2013-03-28 MED ORDER — PROPOFOL 10 MG/ML IV EMUL: 5.0000 ug/kg/min | INTRAVENOUS | Status: DC

## 2013-03-28 MED ORDER — PROPOFOL 10 MG/ML IV EMUL
5.0000 ug/kg/min | Freq: Once | INTRAVENOUS | Status: DC
  Administered 2013-03-28: 25 ug/kg/min via INTRAVENOUS

## 2013-03-28 MED ORDER — LIDOCAINE HCL (CARDIAC) 20 MG/ML IV SOLN
INTRAVENOUS | Status: AC
  Filled 2013-03-28: qty 5

## 2013-03-28 MED ORDER — LORAZEPAM 2 MG/ML IJ SOLN
INTRAMUSCULAR | Status: AC
  Filled 2013-03-28: qty 1

## 2013-03-28 MED ORDER — LORAZEPAM 2 MG/ML IJ SOLN
1.0000 mg | Freq: Once | INTRAMUSCULAR | Status: AC
  Administered 2013-03-28: 1 mg via INTRAVENOUS

## 2013-03-28 MED ORDER — SODIUM CHLORIDE 0.9 % IV SOLN
1000.0000 mg | Freq: Two times a day (BID) | INTRAVENOUS | Status: DC
  Administered 2013-03-28 – 2013-03-29 (×2): 1000 mg via INTRAVENOUS
  Filled 2013-03-28 (×4): qty 10

## 2013-03-28 MED ORDER — QUETIAPINE FUMARATE 200 MG PO TABS
200.0000 mg | ORAL_TABLET | Freq: Every day | ORAL | Status: DC
  Filled 2013-03-28: qty 1

## 2013-03-28 NOTE — ED Notes (Signed)
Patient back from CT.

## 2013-03-28 NOTE — ED Notes (Signed)
0320: Etomidate 25 mg IVP and Succinylcholine 100 mg administered by Chrisandra Netters., RN

## 2013-03-28 NOTE — ED Notes (Signed)
Patient transported to CT 3 by on cardiac monitor with RN and resp therapy

## 2013-03-28 NOTE — ED Notes (Signed)
Family at bedside. 

## 2013-03-28 NOTE — ED Notes (Signed)
EDP, intensivist and patient's son at bedside

## 2013-03-28 NOTE — Progress Notes (Signed)
PULMONARY  / CRITICAL CARE MEDICINE  Name: [name] MRN: [id number] DOB: [birth year]    ADMISSION DATE:  03/28/2013 CONSULTATION DATE:  03/28/2013  REFERRING MD :  EDP PRIMARY SERVICE:  PCCM  CHIEF COMPLAINT:  Status epilepticus  BRIEF PATIENT DESCRIPTION: 67 yo with past medical history of schizophrenia and seizure disorder (on Dilantin and Keppra) brought to ED with status epilepticus. Per EMS, patient had been seizing for 25-30 mins prior to arrival.  EMS gave 5 mg of Versed with resolution of seizure.  Patient was then brought to the ED.  In the ED patient was intubated for airway protection.  Patient was also given IV Keppra 1000 mg x 1.  PCCM was then consulted.  One month ago treated in outside hospital for similar presentation.  SIGNIFICANT EVENTS / STUDIES:  7/30  Head CT >>> NAD  LINES / TUBES: OETT 7/30 >>> OGT 7/30 >>> Foley 7/30 >>>  CULTURES: 7/30 Blood >>> 7/30 Urine >>>  ANTIBIOTICS:  SUBJECTIVE/INTERVAL HISTORY: Seized again this am   VITAL SIGNS: Temp:  [97.7 F (36.5 C)-99.3 F (37.4 C)] 99.3 F (37.4 C) (07/30 0700) Pulse Rate:  [119-145] 127 (07/30 0645) Resp:  [12-31] 18 (07/30 0700) BP: (89-139)/(67-80) 117/77 mmHg (07/30 0700) SpO2:  [100 %] 100 % (07/30 0645) FiO2 (%):  [50 %-100 %] 50 % (07/30 0524) Weight:  [156 lb 15.5 oz (71.2 kg)-166 lb 8 oz (75.524 kg)] 156 lb 15.5 oz (71.2 kg) (07/30 0655)  HEMODYNAMICS:   VENTILATOR SETTINGS: Vent Mode:  [-] PRVC FiO2 (%):  [50 %-100 %] 50 % Set Rate:  [14 bmp-18 bmp] 18 bmp Vt Set:  [500 mL] 500 mL PEEP:  [5 cmH20] 5 cmH20  INTAKE / OUTPUT: Intake/Output     07/29 0701 - 07/30 0700 07/30 0701 - 07/31 0700   I.V. (mL/kg) 119.2 (1.7)    Total Intake(mL/kg) 119.2 (1.7)    Urine (mL/kg/hr) 300    Total Output 300     Net -180.8           PHYSICAL EXAMINATION: General:  Mechanically ventilated, synchronous Neuro:  Encephalopathic, nonfocal, cough / gag diminished HEENT:  PERRL, OETT   Cardiovascular:  RRR, no m/r/g Lungs:  Bilateral diminished air entry, no w/r/r Abdomen:  Soft, nontender, bowel sounds diminished Musculoskeletal:  Moves all extremities, no edema Skin:  Intact  LABS:  Recent Labs Lab 03/28/13 0327 03/28/13 0400 03/28/13 0445  HGB 10.9*  --   --   WBC 7.3  --   --   PLT 239  --   --   NA 134*  --   --   K 4.2  --   --   CL 99  --   --   CO2 22  --   --   GLUCOSE 265*  --   --   BUN 11  --   --   CREATININE 0.48*  --   --   CALCIUM 9.4  --   --   MG  --   --  1.7  AST 27  --   --   ALT 24  --   --   ALKPHOS 121*  --   --   BILITOT 0.1*  --   --   PROT 7.2  --   --   ALBUMIN 3.1*  --   --   LATICACIDVEN 2.5*  --   --   TROPONINI <0.30  --   --   PHART  --  7.270*  --   PCO2ART  --  57.1*  --   PO2ART  --  424.0*  --     Recent Labs Lab 03/28/13 0511 03/28/13 0638  GLUCAP 165* 110*    CXR:  7/30 >>> hardware in good position, no overt airspace disease  ASSESSMENT / PLAN:  PULMONARY A:  Acute respiratory failure in setting of status epilepticus. P:   Full mechanical support currently; patient had additional seizure this am. Daily CXR Will not wean today in the setting of continued seizure activity.  CARDIOVASCULAR A: hemodynamically stable.  No arrhythmia / ischemia.  H/O HTN P:  HTN currently well controlled Holding Antihypertensives currently  RENAL A:  Mild hyponatremia  P:   Daily BMP IVF - 1/2 NS @ 50  GASTROINTESTINAL A:  H/o Hep A,B,C. P:   Starting Tube feeds today (20 mL/hr) Protonix for GI Px AST/ALT WNL  HEMATOLOGIC A:  Anemia. P:  Trend CBC Heparin for DVT Px  INFECTIOUS A:  No overt source of infection. P:   Cultures as above No abx currently  ENDOCRINE  A:  DM.  P:   CBG's Q4 SSI  NEUROLOGIC A:  Status epilepticus.  H/o seizure disorder, schizophrenia. P:   Keppra increased to 1000 mg BID Dilantin level 4.9 (corrected to 6.8 for low albumin); Dilantin continued (200 Q8H).   Neurology consulted this am. Home Lamictal and Seroquel continued for Schizophrenia Sedation: Propofol gtt Goal RASS 0 to -1  TODAY'S SUMMARY: Additional seizure this am which resolved with ativan.  Neurology consulted.  Neurology giving 500 mg Dilantin IV x 1 now and also obtaining EEG. Will await further recommendations.   Everlene Other DO Family Medicine PGY-2   I have interviewed and examined the patient and reviewed the database. I have formulated the assessment and plan as reflected in the note above with amendments made by me.   Billy Fischer, MD;  PCCM service; Mobile [phone]

## 2013-03-28 NOTE — ED Notes (Signed)
Patient presents via [facility] EMS from [facility] for seizures, unresponsive and respiratory distress.   Per Meriam Sprague, RN at [facility]: Patient has hx seizures, on Keppra and Dilantin, had seizure earlier today around 2 pm. Responded to Ativan. Returned to baseline (AAOx4) without any issue. Around 2:10 am, patient was noted to be having seizure-like activity involving only the upper body. By the time of EMS arrival, patient in full body, grand mal seizure activity.   Per EMS: at their arrival, patient in full body seizure activity, agonal breathing and SPO2 60%. Versed 5 mg given. Seizure activity stopped within 1 minute of medication administration. SPO2 100% with BVM. Pt remained unresponsive. Diminished lower breath sounds and rales in upper lobes. BP 150/90.

## 2013-03-28 NOTE — Progress Notes (Signed)
Utilization review completed.  P.J. Nathan Stallworth,RN,BSN Case Manager [phone]  

## 2013-03-28 NOTE — ED Notes (Signed)
1610 - Propofol 40 mg bolus given per verbal order, Dr. Patria Mane

## 2013-03-28 NOTE — Progress Notes (Addendum)
INITIAL NUTRITION ASSESSMENT  DOCUMENTATION CODES Per approved criteria  -Not Applicable   INTERVENTION: 1.  Enteral nutrition; initiate Vital 1.2 @ 20 mL/hr continuous.  Advance by 10 mL after 4 hrs of tolerance to 30 mL/hr goal with Prostat (30 mL) 3 times daily to provide 1164 kcal, 99g protein, 552 mL free water.  Nutrition provision with current rate of propofol:  1784 kcal (101% kcal needs), 99g protein (100% estimated need) and 552 mL free water.  NUTRITION DIAGNOSIS: Inadequate oral intake related to inability to eat as evidenced by intubated, NPO.  Monitor:  1.  Enteral nutrition; initiation with tolerance.  Pt to meet >/=90% estimated needs with nutrition support.  2.  Wt/wt change; monitor trends  Reason for Assessment: consult; TF initiation and management  67 y.o. female  Admitting Dx: seizures  ASSESSMENT: Pt admitted with seizures despite Keppra load. Pt was intubated for airway protection.  Patient is currently intubated on ventilator support.  MV: 7.6 L/min Temp:Temp (24hrs), Avg:99.5 F (37.5 C), Min:97.7 F (36.5 C), Max:102.3 F (39.1 C)  Propofol: 23.5 ml/hr provides 620 kcal/day.  No family at bedside.  Pt is from NH.  Nutrition Focused Physical Exam:  Subcutaneous Fat:  Orbital Region: wnl Upper Arm Region: wnl Thoracic and Lumbar Region: wnl  Muscle:  Temple Region: wnl Clavicle Bone Region: wnl Clavicle and Acromion Bone Region: wnl Scapular Bone Region: wnl Dorsal Hand: n/a Patellar Region: wnl Anterior Thigh Region: n/a Posterior Calf Region: wnl  Edema: none present  Height: Ht Readings from Last 1 Encounters:  03/28/13 5\' 6"  (1.676 m)    Weight: Wt Readings from Last 1 Encounters:  03/28/13 156 lb 15.5 oz (71.2 kg)    Ideal Body Weight: 130 lbs  % Ideal Body Weight: 120%  Wt Readings from Last 10 Encounters:  03/28/13 156 lb 15.5 oz (71.2 kg)    Usual Body Weight: unknown  BMI:  Body mass index is 25.35  kg/(m^2).  Estimated Nutritional Needs: Kcal: 1775 Protein: 90-105g Fluid: ~2.0 L/day  Skin: intact  Diet Order: NPO  EDUCATION NEEDS: -Education not appropriate at this time   Intake/Output Summary (Last 24 hours) at 03/28/13 1343 Last data filed at 03/28/13 1130  Gross per 24 hour  Intake 874.04 ml  Output    650 ml  Net 224.04 ml    Last BM: PTA  Labs:   Recent Labs Lab 03/28/13 0327 03/28/13 0445  NA 134*  --   K 4.2  --   CL 99  --   CO2 22  --   BUN 11  --   CREATININE 0.48*  --   CALCIUM 9.4  --   MG  --  1.7  GLUCOSE 265*  --     CBG (last 3)   Recent Labs  03/28/13 0753 03/28/13 0831 03/28/13 1217  GLUCAP 59* 118* 71    Scheduled Meds: . antiseptic oral rinse  1 application Mouth Rinse QID  . aspirin  81 mg Per Tube Daily  . chlorhexidine  15 mL Mouth/Throat BID  . heparin subcutaneous  5,000 Units Subcutaneous Q8H  . insulin aspart  0-15 Units Subcutaneous Q4H  . lamoTRIgine  150 mg Per Tube QHS  . levETIRAcetam  1,000 mg Intravenous Q12H  . lidocaine (cardiac) 100 mg/22ml      . pantoprazole (PROTONIX) IV  40 mg Intravenous Q24H  . phenytoin (DILANTIN) IV  200 mg Intravenous Q8H  . rocuronium        Continuous  Infusions: . sodium chloride 50 mL/hr at 03/28/13 1130  . feeding supplement (VITAL AF 1.2 CAL) 1,000 mL (03/28/13 1336)  . propofol 55 mcg/kg/min (03/28/13 1130)    Past Medical History  Diagnosis Date  . CHF (congestive heart failure)   . Seizures   . Schizophrenia   . Diabetes mellitus without complication   . Hypertension   . Asthma   . Hepatitis A   . Hepatitis B   . Hepatitis C   . Respiratory failure     History reviewed. No pertinent past surgical history.  Loyce Dys, MS RD LDN Clinical Inpatient Dietitian Pager: [phone] Weekend/After hours pager: [phone]

## 2013-03-28 NOTE — ED Notes (Signed)
Spoke with Tammy Sours in pharmacy re: Dilantin infusion. Pharmacy will now make the infusion and send to the unit

## 2013-03-28 NOTE — ED Notes (Signed)
1610 - patient intubated by Dr. Patria Mane. Used 7.5 ETT, secured at 22 cm. Positive color change. Good breath sounds. No air over stomach. Portable CXR notified.

## 2013-03-28 NOTE — ED Provider Notes (Addendum)
CSN: [id number]     Arrival date & time 03/28/13  0319 History     First MD Initiated Contact with Patient 03/28/13 [phone]     Chief Complaint  Patient presents with  . Seizures   Level V caveat: Unresponsive  HPI His reported that the patient lives at a nursing home and has a history of schizophrenia, hepatitis, seizure disorder.  She is on Keppra and Dilantin.  As reported that she had a seizure earlier today that responded quickly to Ativan.  The patient was noted earlier this morning had seizure activity.  Initially Ativan was attempted to be given however they no longer had any in there supply and thus EMS was called.  By the time EMS arrived the patient had still been seizing and had been seizing for approximately 25-30 minutes per EMS.  The patient was given 5 mg of Versed with resolution of her seizure.  She presents the emergency department with assisted ventilations via bag mask.  The patient is a full code.  It sounds that she was otherwise in a rather normal state health today besides her seizure.  No other history is available.   Past Medical History  Diagnosis Date  . CHF (congestive heart failure)   . Seizures   . Schizophrenia   . Diabetes mellitus without complication   . Hypertension   . Asthma   . Hepatitis A   . Hepatitis B   . Hepatitis C   . Respiratory failure    History reviewed. No pertinent past surgical history. No family history on file. History  Substance Use Topics  . Smoking status: Unknown If Ever Smoked  . Smokeless tobacco: Not on file  . Alcohol Use: Not on file   OB History   Grav Para Term Preterm Abortions TAB SAB Ect Mult Living                 Review of Systems  Unable to perform ROS: Mental status change    Allergies  Depakote; Haldol; Penicillins; Shellfish allergy; and Trileptal  Home Medications   Current Outpatient Rx  Name  Route  Sig  Dispense  Refill  . acetaminophen (TYLENOL) 500 MG tablet   Oral   Take 500 mg by  mouth every 6 (six) hours as needed for pain.         [name] amantadine (SYMMETREL) 100 MG capsule   Oral   Take 100 mg by mouth daily.         [name] aspirin EC 81 MG tablet   Oral   Take 81 mg by mouth daily.         . B Complex-C (B-COMPLEX WITH VITAMIN C) tablet   Oral   Take 1 tablet by mouth daily.         . benazepril (LOTENSIN) 10 MG tablet   Oral   Take 10 mg by mouth daily.         . calcium carbonate (TUMS - DOSED IN MG ELEMENTAL CALCIUM) 500 MG chewable tablet   Oral   Chew 1 tablet by mouth 2 (two) times daily.         [name] docusate sodium (COLACE) 100 MG capsule   Oral   Take 100 mg by mouth 2 (two) times daily.         . insulin aspart (NOVOLOG FLEXPEN) 100 UNIT/ML SOPN FlexPen   Subcutaneous   Inject 0-8 Units into the skin 3 (three) times daily with meals. Per blood  sugar level sliding scale         . lamoTRIgine (LAMICTAL) 25 MG tablet   Oral   Take 150 mg by mouth at bedtime.         . levETIRAcetam (KEPPRA) 500 MG tablet   Oral   Take 500 mg by mouth every 12 (twelve) hours.         . magnesium oxide (MAG-OX) 400 (241.3 MG) MG tablet   Oral   Take 800 mg by mouth 2 (two) times daily.         . metoprolol succinate (TOPROL-XL) 25 MG 24 hr tablet   Oral   Take 25 mg by mouth 2 (two) times daily.         . Paliperidone Palmitate 234 MG/1.5ML SUSP   Intramuscular   Inject 1 Syringe into the muscle every 30 (thirty) days.         . phenytoin (DILANTIN) 100 MG ER capsule   Oral   Take 300 mg by mouth every 12 (twelve) hours.         . potassium chloride SA (K-DUR,KLOR-CON) 20 MEQ tablet   Oral   Take 20 mEq by mouth daily.         . QUEtiapine (SEROQUEL) 200 MG tablet   Oral   Take 200 mg by mouth at bedtime.         . thiamine (VITAMIN B-1) 100 MG tablet   Oral   Take 100 mg by mouth daily.         [name] zolpidem (AMBIEN) 5 MG tablet   Oral   Take 5 mg by mouth at bedtime as needed for sleep.          Temp(Src)  97.7 F (36.5 C)  SpO2 100% Physical Exam  Nursing note and vitals reviewed. Constitutional: She appears well-developed. No distress.  HENT:  Head: Normocephalic and atraumatic.  Eyes: Pupils are equal, round, and reactive to light.  Neck: Neck supple.  Cardiovascular: Regular rhythm and normal heart sounds.   tachycardia  Pulmonary/Chest:  Assisted by bag*mask ventilations  Abdominal: Soft. She exhibits no distension. There is no tenderness.  Musculoskeletal: Normal range of motion.  Neurological:  GCS 3. Gag reflex intact  Skin: Skin is warm and dry.  Psychiatric: She has a normal mood and affect. Judgment normal.    ED Course   Procedures (including critical care time)  INTUBATION Performed by: Lyanne Co  Required items: required blood products, implants, devices, and special equipment available Patient identity confirmed: provided demographic data and hospital-assigned identification number Time out: Immediately prior to procedure a "time out" was called to verify the correct patient, procedure, equipment, support staff and site/side marked as required.  Indications: respiratory failure, unresponsive Intubation method: Glidescope Laryngoscopy  Preoxygenation: BVM Sedatives: Etomidate Paralytic: Succinylcholine Tube Size: 7.5 cuffed Post-procedure assessment: chest rise and ETCO2 monitor Breath sounds: equal and absent over the epigastrium Tube secured with: ETT holder Chest x-ray interpreted by radiologist and me. Chest x-ray findings: endotracheal tube in appropriate position Patient tolerated the procedure well with no immediate complications.   CRITICAL CARE Performed by: Lyanne Co Total critical care time: 35 Critical care time was exclusive of separately billable procedures and treating other patients. Critical care was necessary to treat or prevent imminent or life-threatening deterioration. Critical care was time spent personally by me on the  following activities: development of treatment plan with patient and/or surrogate as well as nursing, discussions with consultants, evaluation of patient's response  to treatment, examination of patient, obtaining history from patient or surrogate, ordering and performing treatments and interventions, ordering and review of laboratory studies, ordering and review of radiographic studies, pulse oximetry and re-evaluation of patient's condition.   Labs Reviewed  CBC WITH DIFFERENTIAL - Abnormal; Notable for the following:    RBC 3.16 (*)    Hemoglobin 10.9 (*)    HCT 32.8 (*)    MCV 103.8 (*)    MCH 34.5 (*)    All other components within normal limits  LACTIC ACID, PLASMA - Abnormal; Notable for the following:    Lactic Acid, Venous 2.5 (*)    All other components within normal limits  URINALYSIS, ROUTINE W REFLEX MICROSCOPIC - Abnormal; Notable for the following:    Glucose, UA 250 (*)    All other components within normal limits  COMPREHENSIVE METABOLIC PANEL - Abnormal; Notable for the following:    Sodium 134 (*)    Glucose, Bld 265 (*)    Creatinine, Ser 0.48 (*)    Albumin 3.1 (*)    Alkaline Phosphatase 121 (*)    Total Bilirubin 0.1 (*)    All other components within normal limits  VALPROIC ACID LEVEL - Abnormal; Notable for the following:    Valproic Acid Lvl <10.0 (*)    All other components within normal limits  POCT I-STAT 3, BLOOD GAS (G3+) - Abnormal; Notable for the following:    pH, Arterial 7.270 (*)    pCO2 arterial 57.1 (*)    pO2, Arterial 424.0 (*)    Bicarbonate 26.4 (*)    All other components within normal limits  CULTURE, BLOOD (ROUTINE X 2)  CULTURE, BLOOD (ROUTINE X 2)  URINE CULTURE  TROPONIN I  PHENYTOIN LEVEL, TOTAL  MAGNESIUM   Ct Head Wo Contrast  03/28/2013   *RADIOLOGY REPORT*  Clinical Data:  Intubated patient.  Altered mental status. Seizure.  CT HEAD WITHOUT CONTRAST  Technique: Contiguous axial images were obtained from the base of the  skull through the vertex without intravenous contrast.  Comparison:   None.  Findings:  No mass lesion, mass effect, midline shift, hydrocephalus, hemorrhage.  No territorial ischemia or acute infarction.  Paranasal sinuses and mastoid air cells appear within normal limits.  Hyperostosis of the calvarium is incidentally noted.  Mild atrophy.  IMPRESSION: No acute intracranial abnormality.   Original Report Authenticated By: Andreas Newport, M.D.   Dg Chest Portable 1 View  03/28/2013   *RADIOLOGY REPORT*  Clinical Data: endotracheal intubation.  PORTABLE CHEST - 1 VIEW  Comparison: None.  Findings: Endotracheal tube tip is 31 mm from the carina, in good position.  Low volume chest with right greater than left basilar atelectasis.  No airspace disease.  No effusion.  Monitoring leads are projected over the chest.  IMPRESSION:  1. Endotracheal tube tip 31 mm from the carina. 2.  Low volume chest.   Original Report Authenticated By: Andreas Newport, M.D.   I personally reviewed the imaging tests through PACS system I reviewed available ER/hospitalization records through the EMR   1. Status epilepticus   2. Respiratory failure, acute     MDM  Patient was intubated on arrival to the emergency department for unresponsiveness.  Much of this is likely secondary to status epilepticus that occurred prior to arrival the emergency department.  Seems that she responded to Versed.  She was intubated without significant difficulty.  There was a significant amount of secretions around her posterior pharynx as well as her  cords that required suctioning prior to intubation.  Stat CT demonstrates no acute abnormalities.  Chest x-ray without clear infiltrate.  She does remain tachycardic and therefore she could have subclinical seizures.  The patient be loaded with Keppra at this time.  Dilantin level pending.  Initially I was told the patient was on Depakote and thus a Depakote level is pending however I found out later  that the patient is allergic to Depakote  The patient be admitted to the intensive care unit.  Lyanne Co, MD 03/28/13 0865  Lyanne Co, MD 03/28/13 [phone]

## 2013-03-28 NOTE — ED Notes (Signed)
Phoned pharmacy to send Keppra gtt

## 2013-03-28 NOTE — ED Notes (Signed)
0330 - Portable CXR at bedside

## 2013-03-28 NOTE — Progress Notes (Signed)
Hypoglycemic Event  CBG: 59 at 0815  Treatment: D50 IV 50 mL  Symptoms: Pale and Sweaty  Follow-up CBG: Time:0830 CBG Result:118  Possible Reasons for Event: Unknown  Comments/MD notified:MD notified     Manus, Austyn Seier E  Remember to initiate Hypoglycemia Order Set & complete

## 2013-03-28 NOTE — ED Notes (Signed)
0330 - Propofol 40 mg bolus given per Dr. Patria Mane

## 2013-03-28 NOTE — H&P (Signed)
PULMONARY  / CRITICAL CARE MEDICINE  Name: [name] MRN: [id number] DOB: August 29, 1946    ADMISSION DATE:  03/28/2013 CONSULTATION DATE:  03/28/2013  REFERRING MD :  EDP PRIMARY SERVICE:  PCCM  CHIEF COMPLAINT:  Status epilepticus  BRIEF PATIENT DESCRIPTION: 67 yo with past medical history of schizophrenia and seizure disorder brought to ED with status epilepticus.  In ED intubated for airway protection.  PCCM was consulted.  One month ago treated in outside hospital for similar presentation.  SIGNIFICANT EVENTS / STUDIES:  7/30  Head CT >>> nad  LINES / TUBES: OETT 7/30 >>> OGT 7/30 >>> Foley 7/30 >>>  CULTURES: 7/30 Blood >>> 7/30 Urine >>>  ANTIBIOTICS:  The patient is encephalopathic and unable to provide history, which was obtained for available medical records.  HISTORY OF PRESENT ILLNESS:  67 yo with past medical history of schizophrenia and seizure disorder brought to ED with status epilepticus.  In ED intubated for airway protection.  PCCM was consulted.  One month ago treated in outside hospital for similar presentation.  PAST MEDICAL HISTORY :  Past Medical History  Diagnosis Date  . CHF (congestive heart failure)   . Seizures   . Schizophrenia   . Diabetes mellitus without complication   . Hypertension   . Asthma   . Hepatitis A   . Hepatitis B   . Hepatitis C   . Respiratory failure    History reviewed. No pertinent past surgical history. Prior to Admission medications   Medication Sig Start Date End Date Taking? Authorizing Provider  acetaminophen (TYLENOL) 500 MG tablet Take 500 mg by mouth every 6 (six) hours as needed for pain.   Yes Historical Provider, MD  amantadine (SYMMETREL) 100 MG capsule Take 100 mg by mouth daily.   Yes Historical Provider, MD  aspirin EC 81 MG tablet Take 81 mg by mouth daily.   Yes Historical Provider, MD  B Complex-C (B-COMPLEX WITH VITAMIN C) tablet Take 1 tablet by mouth daily.   Yes Historical Provider, MD   benazepril (LOTENSIN) 10 MG tablet Take 10 mg by mouth daily.   Yes Historical Provider, MD  calcium carbonate (TUMS - DOSED IN MG ELEMENTAL CALCIUM) 500 MG chewable tablet Chew 1 tablet by mouth 2 (two) times daily.   Yes Historical Provider, MD  docusate sodium (COLACE) 100 MG capsule Take 100 mg by mouth 2 (two) times daily.   Yes Historical Provider, MD  insulin aspart (NOVOLOG FLEXPEN) 100 UNIT/ML SOPN FlexPen Inject 0-8 Units into the skin 3 (three) times daily with meals. Per blood sugar level sliding scale   Yes Historical Provider, MD  lamoTRIgine (LAMICTAL) 25 MG tablet Take 150 mg by mouth at bedtime.   Yes Historical Provider, MD  levETIRAcetam (KEPPRA) 500 MG tablet Take 500 mg by mouth every 12 (twelve) hours.   Yes Historical Provider, MD  magnesium oxide (MAG-OX) 400 (241.3 MG) MG tablet Take 800 mg by mouth 2 (two) times daily.   Yes Historical Provider, MD  metoprolol succinate (TOPROL-XL) 25 MG 24 hr tablet Take 25 mg by mouth 2 (two) times daily.   Yes Historical Provider, MD  Paliperidone Palmitate 234 MG/1.5ML SUSP Inject 1 Syringe into the muscle every 30 (thirty) days.   Yes Historical Provider, MD  phenytoin (DILANTIN) 100 MG ER capsule Take 300 mg by mouth every 12 (twelve) hours.   Yes Historical Provider, MD  potassium chloride SA (K-DUR,KLOR-CON) 20 MEQ tablet Take 20 mEq by mouth daily.  Yes Historical Provider, MD  QUEtiapine (SEROQUEL) 200 MG tablet Take 200 mg by mouth at bedtime.   Yes Historical Provider, MD  thiamine (VITAMIN B-1) 100 MG tablet Take 100 mg by mouth daily.   Yes Historical Provider, MD  zolpidem (AMBIEN) 5 MG tablet Take 5 mg by mouth at bedtime as needed for sleep.   Yes Historical Provider, MD   Allergies  Allergen Reactions  . Depakote (Valproic Acid)   . Haldol (Haloperidol Lactate)   . Penicillins   . Shellfish Allergy   . Trileptal (Oxcarbazepine)    FAMILY HISTORY:  No family history on file.  SOCIAL HISTORY:  has no tobacco,  alcohol, and drug history on file.  REVIEW OF SYSTEMS:  Unable to provide.  INTERVAL HISTORY:  VITAL SIGNS: Temp:  [97.7 F (36.5 C)-99.1 F (37.3 C)] 99.1 F (37.3 C) (07/30 0430) Pulse Rate:  [132-145] 132 (07/30 0430) Resp:  [12-31] 23 (07/30 0430) BP: (89-114)/(67-74) 103/70 mmHg (07/30 0430) SpO2:  [100 %] 100 % (07/30 0430) FiO2 (%):  [50 %-100 %] 50 % (07/30 0405)  HEMODYNAMICS:   VENTILATOR SETTINGS: Vent Mode:  [-] PRVC FiO2 (%):  [50 %-100 %] 50 % Set Rate:  [14 bmp-18 bmp] 18 bmp Vt Set:  [500 mL] 500 mL PEEP:  [5 cmH20] 5 cmH20  INTAKE / OUTPUT: Intake/Output   None    PHYSICAL EXAMINATION: General:  Mechanically ventilated, synchronous Neuro:  Encephalopathic, nonfocal, cough / gag diminished HEENT:  PERRL, OETT  Cardiovascular:  RRR, no m/r/g Lungs:  Bilateral diminished air entry, no w/r/r Abdomen:  Soft, nontender, bowel sounds diminished Musculoskeletal:  Moves all extremities, no edema Skin:  Intact  LABS:  Recent Labs Lab 03/28/13 0327 03/28/13 0400  HGB 10.9*  --   WBC 7.3  --   PLT 239  --   NA 134*  --   K 4.2  --   CL 99  --   CO2 22  --   GLUCOSE 265*  --   BUN 11  --   CREATININE 0.48*  --   CALCIUM 9.4  --   AST 27  --   ALT 24  --   ALKPHOS 121*  --   BILITOT 0.1*  --   PROT 7.2  --   ALBUMIN 3.1*  --   LATICACIDVEN 2.5*  --   TROPONINI <0.30  --   PHART  --  7.270*  PCO2ART  --  57.1*  PO2ART  --  424.0*   No results found for this basename: GLUCAP,  in the last 168 hours  CXR:  7/30 >>> hardware in good position, no overt airspace disease  ASSESSMENT / PLAN:  PULMONARY A:  Acute respiratory failure in setting of status epilepticus. P:   Gaol SpO2>92, pH>7.30 Full mechanical support Daily SBT Trend ABG / CXR  CARDIOVASCULAR A: hemodynamically stable.  No arrhythmia / ischemia.  H/o NTH. P:  Goal MAP>60 Continue ASA Hold Metoprolol / Lotensin as risk for hypotension with positive pressure ventilation /  sedation  RENAL A:  No active issues. P:   Trend BMP Mg level NS@100   GASTROINTESTINAL A:  H/o Hep A,B,C. P:   NPO as intubated TF if remains intubated > 24 hours Protonix for GI Px  HEMATOLOGIC A:  Anemia. P:  Trend CBC Heparin for DVT Px  INFECTIOUS A:  No overt source of infection. P:   Cultures as above UA Defer abx  ENDOCRINE  A:  DM.  P:   SSI  NEUROLOGIC A:  Status epilepticus.  H/o seizure disorder, schizophrenia. P:   Keppra, increased to 1000 q12h Dilantin, schedule changed to 200 q8h Lamictal continued Seroquel continued Amantadine held as potential for seizures  Goal RASS 0 to -1 Propofol gtt  TODAY'S SUMMARY: Acute respiratory failure in setting of status epilepticus. Anticonvulsives.  Mechanical support.  SBT/WUA in AM.  I have personally obtained a history, examined the patient, evaluated laboratory and imaging results, formulated the assessment and plan and placed orders.  CRITICAL CARE:  The patient is critically ill with multiple organ systems failure and requires high complexity decision making for assessment and support, frequent evaluation and titration of therapies, application of advanced monitoring technologies and extensive interpretation of multiple databases. Critical Care Time devoted to patient care services described in this note is 45 minutes.   Lonia Farber, MD Pulmonary and Critical Care Medicine [facility] Pager: [phone]  03/28/2013, 4:40 AM

## 2013-03-28 NOTE — ED Notes (Signed)
1610 - Propofol 20 mg bolus given

## 2013-03-28 NOTE — Progress Notes (Signed)
eeg completed at bedside

## 2013-03-28 NOTE — ED Notes (Signed)
1610 - Propofol 40 mg bolus given

## 2013-03-28 NOTE — Progress Notes (Signed)
Additional seizure activity this am at ~830.  1 mg of Ativan given with resolution of seizure activity.

## 2013-03-28 NOTE — Consult Note (Addendum)
Reason for Consult:Status Epilepticus Referring Physician: Zubelevitskiy  CC: Seizures despite Keppra load   HPI: [name] is an 67 y.o. female with a history of seizures.  Was noted at [facility] facility to have seizures.  Ativan was not available and patient had clinical seizure activity for approximately 30 minutes prior to EMS arrival when Versed was given with resolution of seizure activity.  Patient required intubation.  Was loaded with Keppra but despite the Keppra had continued seizure activity.  Consult called for further recommendations.    Past Medical History  Diagnosis Date  . CHF (congestive heart failure)   . Seizures   . Schizophrenia   . Diabetes mellitus without complication   . Hypertension   . Asthma   . Hepatitis A   . Hepatitis B   . Hepatitis C   . Respiratory failure     History reviewed. No pertinent past surgical history.  Family history: Unable to obtain. Patient intubated.    Social History:  has no tobacco, alcohol, and drug history on file.  Allergies  Allergen Reactions  . Depakote (Valproic Acid)   . Haldol (Haloperidol Lactate)   . Penicillins   . Shellfish Allergy   . Trileptal (Oxcarbazepine)     Medications:  I have reviewed the patient's current medications. Scheduled: . antiseptic oral rinse  1 application Mouth Rinse QID  . aspirin  81 mg Per Tube Daily  . chlorhexidine  15 mL Mouth/Throat BID  . heparin subcutaneous  5,000 Units Subcutaneous Q8H  . insulin aspart  0-15 Units Subcutaneous Q4H  . lamoTRIgine  150 mg Per Tube QHS  . levETIRAcetam  1,000 mg Intravenous Q12H  . lidocaine (cardiac) 100 mg/43ml      . pantoprazole (PROTONIX) IV  40 mg Intravenous Q24H  . phenytoin (DILANTIN) IV  200 mg Intravenous Q8H  . rocuronium        ROS: Unable to obtain  Physical Examination: Blood pressure 111/66, pulse 118, temperature 101.7 F (38.7 C), temperature source Core (Comment), resp. rate 18, height 5\' 6"  (1.676 m), weight  71.2 kg (156 lb 15.5 oz), SpO2 100.00%.  Neurologic Examination Mental Status: Patient does not respond to verbal stimuli.  Localizes to pain with deep sternal rub.  Does not follow commands.  No verbalizations noted.  Cranial Nerves: II: patient does not respond confrontation bilaterally, pupils right 3 mm, left 3 mm,and reactive bilaterally III,IV,VI: doll's response absent bilaterally.  V,VII: corneal reflex reduced bilaterally  VIII: patient does not respond to verbal stimuli IX,X: gag reflex reduced, XI: trapezius strength unable to test bilaterally XII: tongue strength unable to test Motor: Moves all extremities weakly in response to painful stimuli Sensory: Responds to noxious stimuli in a extremities. Deep Tendon Reflexes:  2+ throughout with absent AJ's bilaterally. Plantars: mute bilaterally Cerebellar: Unable to perform    Laboratory Studies:   Basic Metabolic Panel:  Recent Labs Lab 03/28/13 0327 03/28/13 0445  NA 134*  --   K 4.2  --   CL 99  --   CO2 22  --   GLUCOSE 265*  --   BUN 11  --   CREATININE 0.48*  --   CALCIUM 9.4  --   MG  --  1.7    Liver Function Tests:  Recent Labs Lab 03/28/13 0327  AST 27  ALT 24  ALKPHOS 121*  BILITOT 0.1*  PROT 7.2  ALBUMIN 3.1*   No results found for this basename: LIPASE, AMYLASE,  in  the last 168 hours No results found for this basename: AMMONIA,  in the last 168 hours  CBC:  Recent Labs Lab 03/28/13 0327  WBC 7.3  NEUTROABS 4.0  HGB 10.9*  HCT 32.8*  MCV 103.8*  PLT 239    Cardiac Enzymes:  Recent Labs Lab 03/28/13 0327  TROPONINI <0.30    BNP: No components found with this basename: POCBNP,   CBG:  Recent Labs Lab 03/28/13 0511 03/28/13 0638 03/28/13 0753 03/28/13 0831  GLUCAP 165* 110* 59* 118*    Microbiology: Results for orders placed during the hospital encounter of 03/28/13  MRSA PCR SCREENING     Status: None   Collection Time    03/28/13  6:48 AM      Result  Value Range Status   MRSA by PCR NEGATIVE  NEGATIVE Final   Comment:            The GeneXpert MRSA Assay (FDA     approved for NASAL specimens     only), is one component of a     comprehensive MRSA colonization     surveillance program. It is not     intended to diagnose MRSA     infection nor to guide or     monitor treatment for     MRSA infections.    Coagulation Studies: No results found for this basename: LABPROT, INR,  in the last 72 hours  Urinalysis:  Recent Labs Lab 03/28/13 0430  COLORURINE YELLOW  LABSPEC 1.017  PHURINE 7.0  GLUCOSEU 250*  HGBUR NEGATIVE  BILIRUBINUR NEGATIVE  KETONESUR NEGATIVE  PROTEINUR NEGATIVE  UROBILINOGEN 0.2  NITRITE NEGATIVE  LEUKOCYTESUR NEGATIVE    Lipid Panel:  No results found for this basename: chol, trig, hdl, cholhdl, vldl, ldlcalc    HgbA1C:  No results found for this basename: HGBA1C    Urine Drug Screen:   No results found for this basename: labopia, cocainscrnur, labbenz, amphetmu, thcu, labbarb    Alcohol Level: No results found for this basename: ETH,  in the last 168 hours  Imaging: Ct Head Wo Contrast  03/28/2013   *RADIOLOGY REPORT*  Clinical Data:  Intubated patient.  Altered mental status. Seizure.  CT HEAD WITHOUT CONTRAST  Technique: Contiguous axial images were obtained from the base of the skull through the vertex without intravenous contrast.  Comparison:   None.  Findings:  No mass lesion, mass effect, midline shift, hydrocephalus, hemorrhage.  No territorial ischemia or acute infarction.  Paranasal sinuses and mastoid air cells appear within normal limits.  Hyperostosis of the calvarium is incidentally noted.  Mild atrophy.  IMPRESSION: No acute intracranial abnormality.   Original Report Authenticated By: Andreas Newport, M.D.   Dg Chest Portable 1 View  03/28/2013   *RADIOLOGY REPORT*  Clinical Data: endotracheal intubation.  PORTABLE CHEST - 1 VIEW  Comparison: None.  Findings: Endotracheal tube tip  is 31 mm from the carina, in good position.  Low volume chest with right greater than left basilar atelectasis.  No airspace disease.  No effusion.  Monitoring leads are projected over the chest.  IMPRESSION:  1. Endotracheal tube tip 31 mm from the carina. 2.  Low volume chest.   Original Report Authenticated By: Andreas Newport, M.D.     Assessment/Plan: 67 year old female with a history of seizures, presenting in status epilepticus.  She is now intubated.  Has had recurrent seizure activity despite Keppra being loaded and maintenance dose increased.  Dilantin level checked and subtherapeutic at  4.9.  Despite correction remains subtherapeutic.  Patient restarted on her home maintenance of 600mg  a day. CT reviewed and shows no acute changes.  Recommendations: 1.  Continue Keppra at current dose of 1000mg  q12 hours 2.  Give a load of Dilanitn-500mg  IV now 3.  Recheck Dilantin level 2-3 hours after load 4.  EEG to rule out nonconvulsive status epilepticus 5.  Lamictal to be maintained at outpatient dose of 150mg  daily.   6.  May use Ativan prn    Thana Farr, MD [facility] [phone] 03/28/2013, 11:33 AM   Addendum: No further clinical seizure activity noted after load of Dilantin.  EEG pending.  Thana Farr, MD [facility] [phone]

## 2013-03-29 LAB — CBC
Hemoglobin: 9.4 g/dL — ABNORMAL LOW (ref 12.0–15.0)
MCH: 33.9 pg (ref 26.0–34.0)
RBC: 2.77 MIL/uL — ABNORMAL LOW (ref 3.87–5.11)

## 2013-03-29 LAB — URINE CULTURE: Culture: NO GROWTH

## 2013-03-29 LAB — BASIC METABOLIC PANEL
CO2: 23 mEq/L (ref 19–32)
Chloride: 109 mEq/L (ref 96–112)
Glucose, Bld: 130 mg/dL — ABNORMAL HIGH (ref 70–99)
Potassium: 3.4 mEq/L — ABNORMAL LOW (ref 3.5–5.1)
Sodium: 141 mEq/L (ref 135–145)

## 2013-03-29 LAB — GLUCOSE, CAPILLARY
Glucose-Capillary: 131 mg/dL — ABNORMAL HIGH (ref 70–99)
Glucose-Capillary: 145 mg/dL — ABNORMAL HIGH (ref 70–99)

## 2013-03-29 MED ORDER — PHENYTOIN SODIUM EXTENDED 100 MG PO CAPS
400.0000 mg | ORAL_CAPSULE | Freq: Two times a day (BID) | ORAL | Status: DC
  Administered 2013-03-29 – 2013-03-30 (×3): 400 mg via ORAL
  Filled 2013-03-29 (×4): qty 4

## 2013-03-29 MED ORDER — FENTANYL CITRATE 0.05 MG/ML IJ SOLN: 12.5000 ug | INTRAMUSCULAR | Status: DC | PRN

## 2013-03-29 MED ORDER — ENOXAPARIN SODIUM 40 MG/0.4ML ~~LOC~~ SOLN
40.0000 mg | SUBCUTANEOUS | Status: DC
  Administered 2013-03-29 – 2013-03-31 (×3): 40 mg via SUBCUTANEOUS
  Filled 2013-03-29 (×3): qty 0.4

## 2013-03-29 MED ORDER — ASPIRIN 81 MG PO CHEW
81.0000 mg | CHEWABLE_TABLET | Freq: Every day | ORAL | Status: DC
  Administered 2013-03-30 – 2013-04-02 (×4): 81 mg via ORAL
  Filled 2013-03-29 (×4): qty 1

## 2013-03-29 MED ORDER — FENTANYL CITRATE 0.05 MG/ML IJ SOLN: 50.0000 ug | INTRAMUSCULAR | Status: DC | PRN

## 2013-03-29 MED ORDER — LORAZEPAM 2 MG/ML IJ SOLN: 1.0000 mg | INTRAMUSCULAR | Status: DC | PRN

## 2013-03-29 MED ORDER — INSULIN ASPART 100 UNIT/ML ~~LOC~~ SOLN
3.0000 [IU] | Freq: Three times a day (TID) | SUBCUTANEOUS | Status: DC
  Administered 2013-03-29 – 2013-04-02 (×11): 3 [IU] via SUBCUTANEOUS

## 2013-03-29 MED ORDER — QUETIAPINE FUMARATE 200 MG PO TABS
200.0000 mg | ORAL_TABLET | Freq: Every day | ORAL | Status: DC
  Administered 2013-03-29 – 2013-04-01 (×4): 200 mg via ORAL
  Filled 2013-03-29 (×5): qty 1

## 2013-03-29 MED ORDER — LEVETIRACETAM 500 MG PO TABS
1000.0000 mg | ORAL_TABLET | Freq: Two times a day (BID) | ORAL | Status: DC
  Administered 2013-03-29 – 2013-04-02 (×8): 1000 mg via ORAL
  Filled 2013-03-29 (×9): qty 2

## 2013-03-29 MED ORDER — POTASSIUM CHLORIDE 20 MEQ/15ML (10%) PO LIQD
40.0000 meq | Freq: Once | ORAL | Status: AC
  Administered 2013-03-29: 40 meq
  Filled 2013-03-29: qty 30

## 2013-03-29 MED ORDER — BIOTENE DRY MOUTH MT LIQD
15.0000 mL | Freq: Two times a day (BID) | OROMUCOSAL | Status: DC
  Administered 2013-03-29 – 2013-04-02 (×8): 15 mL via OROMUCOSAL

## 2013-03-29 MED ORDER — LAMOTRIGINE 150 MG PO TABS
150.0000 mg | ORAL_TABLET | Freq: Every day | ORAL | Status: DC
  Administered 2013-03-29 – 2013-04-01 (×4): 150 mg via ORAL
  Filled 2013-03-29 (×5): qty 1

## 2013-03-29 MED ORDER — INSULIN ASPART 100 UNIT/ML ~~LOC~~ SOLN
0.0000 [IU] | Freq: Three times a day (TID) | SUBCUTANEOUS | Status: DC
  Administered 2013-03-30 – 2013-04-02 (×5): 1 [IU] via SUBCUTANEOUS

## 2013-03-29 MED ORDER — POTASSIUM CHLORIDE 20 MEQ/15ML (10%) PO LIQD
ORAL | Status: AC
  Filled 2013-03-29: qty 30

## 2013-03-29 MED ORDER — ACETAMINOPHEN 325 MG PO TABS: 650.0000 mg | ORAL_TABLET | Freq: Four times a day (QID) | ORAL | Status: DC | PRN

## 2013-03-29 NOTE — Progress Notes (Signed)
Subjective: Patient off Propofol.  Awake and alert.  Following commands.  Remains intubated.    Objective: Current vital signs: BP 103/77  Pulse 102  Temp(Src) 98.2 F (36.8 C) (Core (Comment))  Resp 13  Ht 5\' 6"  (1.676 m)  Wt 70.7 kg (155 lb 13.8 oz)  BMI 25.17 kg/m2  SpO2 100% Vital signs in last 24 hours: Temp:  [98.1 F (36.7 C)-102.3 F (39.1 C)] 98.2 F (36.8 C) (07/31 0700) Pulse Rate:  [100-130] 102 (07/31 0700) Resp:  [10-19] 13 (07/31 0700) BP: (75-129)/(41-81) 103/77 mmHg (07/31 0700) SpO2:  [99 %-100 %] 100 % (07/31 0700) FiO2 (%):  [40 %] 40 % (07/31 0400) Weight:  [70.7 kg (155 lb 13.8 oz)] 70.7 kg (155 lb 13.8 oz) (07/31 0500)  Intake/Output from previous day: 07/30 0701 - 07/31 0700 In: 3168 [I.V.:1958.7; NG/GT:567.3; IV Piggyback:642] Out: 3020 [Urine:3020] Intake/Output this shift:   Nutritional status: NPO  Neurologic Exam: Mental Status: Alert.  Able to follow simple commands without difficulty. Cranial Nerves: II: Pupils equal, round, reactive to light and accommodation.  Blinks to bilateral confrontation III,IV, VI: ptosis not present, extra-ocular motions intact bilaterally V,VII: smile symmetric VIII: hearing normal bilaterally IX,X: gag reflex present XI: bilateral shoulder shrug Motor: Moves all extremities against gravity.   Sensory: Responds to light noxious stimuli throughout Deep Tendon Reflexes: 2+ and symmetric with absent AJ's bilaterally Plantars: Mute bilaterally  Lab Results: Basic Metabolic Panel:  Recent Labs Lab 03/28/13 0327 03/28/13 0445 03/28/13 1425 03/29/13 0555  NA 134*  --  136 141  K 4.2  --  4.4 3.4*  CL 99  --  103 109  CO2 22  --  20 23  GLUCOSE 265*  --  101* 130*  BUN 11  --  7 7  CREATININE 0.48*  --  0.42* 0.43*  CALCIUM 9.4  --  9.1 8.9  MG  --  1.7  --   --     Liver Function Tests:  Recent Labs Lab 03/28/13 0327  AST 27  ALT 24  ALKPHOS 121*  BILITOT 0.1*  PROT 7.2  ALBUMIN 3.1*    No results found for this basename: LIPASE, AMYLASE,  in the last 168 hours No results found for this basename: AMMONIA,  in the last 168 hours  CBC:  Recent Labs Lab 03/28/13 0327 03/28/13 1425 03/29/13 0555  WBC 7.3 9.7 7.6  NEUTROABS 4.0  --   --   HGB 10.9* 10.5* 9.4*  HCT 32.8* 31.7* 28.6*  MCV 103.8* 102.6* 103.2*  PLT 239 200 184    Cardiac Enzymes:  Recent Labs Lab 03/28/13 0327  TROPONINI <0.30    Lipid Panel: No results found for this basename: CHOL, TRIG, HDL, CHOLHDL, VLDL, LDLCALC,  in the last 168 hours  CBG:  Recent Labs Lab 03/28/13 1217 03/28/13 1614 03/28/13 1949 03/29/13 03/29/13 0410  GLUCAP 71 137* 123* 131* 145*    Microbiology: Results for orders placed during the hospital encounter of 03/28/13  MRSA PCR SCREENING     Status: None   Collection Time    03/28/13  6:48 AM      Result Value Range Status   MRSA by PCR NEGATIVE  NEGATIVE Final   Comment:            The GeneXpert MRSA Assay (FDA     approved for NASAL specimens     only), is one component of a     comprehensive MRSA colonization  surveillance program. It is not     intended to diagnose MRSA     infection nor to guide or     monitor treatment for     MRSA infections.    Coagulation Studies: No results found for this basename: LABPROT, INR,  in the last 72 hours  Imaging: Ct Head Wo Contrast  03/28/2013   *RADIOLOGY REPORT*  Clinical Data:  Intubated patient.  Altered mental status. Seizure.  CT HEAD WITHOUT CONTRAST  Technique: Contiguous axial images were obtained from the base of the skull through the vertex without intravenous contrast.  Comparison:   None.  Findings:  No mass lesion, mass effect, midline shift, hydrocephalus, hemorrhage.  No territorial ischemia or acute infarction.  Paranasal sinuses and mastoid air cells appear within normal limits.  Hyperostosis of the calvarium is incidentally noted.  Mild atrophy.  IMPRESSION: No acute intracranial  abnormality.   Original Report Authenticated By: Andreas Newport, M.D.   Dg Chest Portable 1 View  03/28/2013   *RADIOLOGY REPORT*  Clinical Data: endotracheal intubation.  PORTABLE CHEST - 1 VIEW  Comparison: None.  Findings: Endotracheal tube tip is 31 mm from the carina, in good position.  Low volume chest with right greater than left basilar atelectasis.  No airspace disease.  No effusion.  Monitoring leads are projected over the chest.  IMPRESSION:  1. Endotracheal tube tip 31 mm from the carina. 2.  Low volume chest.   Original Report Authenticated By: Andreas Newport, M.D.    Medications:  I have reviewed the patient's current medications. Scheduled: . antiseptic oral rinse  1 application Mouth Rinse QID  . aspirin  81 mg Per Tube Daily  . chlorhexidine  15 mL Mouth/Throat BID  . heparin subcutaneous  5,000 Units Subcutaneous Q8H  . insulin aspart  0-15 Units Subcutaneous Q4H  . lamoTRIgine  150 mg Per Tube QHS  . levETIRAcetam  1,000 mg Intravenous Q12H  . pantoprazole (PROTONIX) IV  40 mg Intravenous Q24H  . phenytoin (DILANTIN) IV  200 mg Intravenous Q8H  . potassium chloride  40 mEq Per Tube Once    Assessment/Plan: 67 year old female admitted with status epilepticus.  No further seizures noted since the load of Dilantin.  Level this morning of 11.1.  Remains on Keppra and Lamictal.  EEG performed on yesterday showed no continued seizure activity.    Recommendations: 1.  Patient likely to be extubated today.  Would restart Dilantin po once medically reasonable at 400mg  BID.  Until that time would continue current dose of IV Dilantin.   2.  Keppra may be changed to po once medically reasonable as well.   3.  Continue Lamictal 4.  Continue seizure precautions.     LOS: 1 day   Thana Farr, MD [facility] [phone] 03/29/2013  8:14 AM

## 2013-03-29 NOTE — Procedures (Signed)
HISTORY:  A 67 year old female with history of seizures and schizophrenia presenting with status epilepticus.  MEDICATIONS:  Aspirin, Keppra, Dilantin, and Diprivan.  CONDITIONS OF RECORDING:  This is a 16-channel EEG carried out with the patient in the poorly responsive state.  DESCRIPTION:  The background activity is asymmetric.  There is predominance of beta activity that is poorly organized, noted over the right hemisphere.  Over the left hemisphere, there is slowing of the background rhythm with an underlying polymorphic delta rhythm seen. This underlying polymorphic delta rhythm is persistent throughout the tracing.  Also, noted over the right hemisphere are some occasional right sharp transients with phase reversal at T4.   Hyperventilation was not performed.  Intermittent photic stimulation failed to elicit any change in the tracing.  IMPRESSION:  This is an abnormal EEG secondary to hemispheric asymmetry and right temporal sharp transients.  This is consistent with the patient's history of seizures.  No evidence of nonconvulsive seizure activity is noted.          ______________________________ Thana Farr, MD    [id number] D:  03/28/2013 18:25:27  T:  03/29/2013 05:49:17  Job #:  [id number]

## 2013-03-29 NOTE — Care Management Note (Signed)
    Page 1 of 1   03/29/2013     12:07:21 PM   CARE MANAGEMENT NOTE 03/29/2013  Patient:  [name]   Account Number:  [id number]  Date Initiated:  03/28/2013  Documentation initiated by:  [facility]  Subjective/Objective Assessment:   seizures - requirng intubation.     Action/Plan:   Anticipated DC Date:  04/04/2013   Anticipated DC Plan:  LONG TERM ACUTE CARE (LTAC)  In-house referral  Clinical Social Worker      DC Planning Services  CM consult      Choice offered to / List presented to:             Status of service:  In process, will continue to follow Medicare Important Message given?   (If response is "NO", the following Medicare IM given date fields will be blank) Date Medicare IM given:   Date Additional Medicare IM given:    Discharge Disposition:    Per UR Regulation:  Reviewed for med. necessity/level of care/duration of stay  If discussed at Long Length of Stay Meetings, dates discussed:    Comments:  ContactMadaline Savage   [phone]   Chong Sicilian Son [phone]  03-29-13 12noon - Avie Arenas, RNBSN - [phone] Extubated - talked with son [name] - states from [facility] on [address] and would like her to return there when able.  SW consult placed.

## 2013-03-29 NOTE — Progress Notes (Signed)
PULMONARY  / CRITICAL CARE MEDICINE  Name: [name] SITZMANN MRN: [id number] DOB: 02-07-1946    ADMISSION DATE:  03/28/2013 CONSULTATION DATE:  03/28/2013  REFERRING MD :  EDP PRIMARY SERVICE:  PCCM  CHIEF COMPLAINT:  Status epilepticus  BRIEF PATIENT DESCRIPTION: 67 yo with past medical history of schizophrenia and seizure disorder (on Dilantin and Keppra) brought to ED with status epilepticus. Per EMS, patient had been seizing for 25-30 mins prior to arrival.  EMS gave 5 mg of Versed with resolution of seizure.  Patient was then brought to the ED.  In the ED patient was intubated for airway protection.  Patient was also given IV Keppra 1000 mg x 1.  PCCM was then consulted.  One month ago treated in outside hospital for similar presentation.  SIGNIFICANT EVENTS / STUDIES:  7/30  Head CT >>> NAD 7/30  Additional seizure activity; Given Ativan and Dilantin load 7/30  EEG - This is an abnormal EEG secondary to hemispheric asymmetry and right temporal sharp transients. This is consistent with the patient's history of seizures. No evidence of nonconvulsive seizure activity is noted.  LINES / TUBES: OETT 7/30 >>> OGT 7/30 >>> Foley 7/30 >>>  CULTURES: 7/30 Blood >>> 7/30 Urine >>>  ANTIBIOTICS:  SUBJECTIVE/INTERVAL HISTORY: Patient has been seizure free since yesterday following Dilantin Patient awake, alert this am.  VITAL SIGNS: Temp:  [98.1 F (36.7 C)-102.3 F (39.1 C)] 98.2 F (36.8 C) (07/31 0700) Pulse Rate:  [100-130] 102 (07/31 0700) Resp:  [10-19] 13 (07/31 0700) BP: (75-129)/(41-81) 103/77 mmHg (07/31 0700) SpO2:  [99 %-100 %] 100 % (07/31 0700) FiO2 (%):  [40 %] 40 % (07/31 0400) Weight:  [155 lb 13.8 oz (70.7 kg)] 155 lb 13.8 oz (70.7 kg) (07/31 0500)  HEMODYNAMICS:   VENTILATOR SETTINGS: Vent Mode:  [-] PRVC FiO2 (%):  [40 %] 40 % Set Rate:  [14 bmp-18 bmp] 14 bmp Vt Set:  [500 mL] 500 mL PEEP:  [5 cmH20] 5 cmH20 Plateau Pressure:  [14 cmH20-16 cmH20] 15  cmH20  INTAKE / OUTPUT: Intake/Output     07/30 0701 - 07/31 0700 07/31 0701 - 08/01 0700   I.V. (mL/kg) 1958.7 (27.7)    NG/GT 567.3    IV Piggyback 642    Total Intake(mL/kg) 3168 (44.8)    Urine (mL/kg/hr) 3020 (1.8)    Total Output 3020     Net +148          Stool Occurrence 1 x     PHYSICAL EXAMINATION: General:  Awake, alert on vent Neuro: Awake, alert and following commands this am. HEENT:  PERRL, OETT  Cardiovascular:  RRR, no m/r/g Lungs:  CTAB.  Abdomen:  Soft, nontender, nondistended. Musculoskeletal:  Moves all extremities, no edema Skin:  Intact  LABS:  Recent Labs Lab 03/28/13 0327 03/28/13 0400 03/28/13 0445 03/28/13 1425 03/29/13 0555  HGB 10.9*  --   --  10.5* 9.4*  WBC 7.3  --   --  9.7 7.6  PLT 239  --   --  200 184  NA 134*  --   --  136 141  K 4.2  --   --  4.4 3.4*  CL 99  --   --  103 109  CO2 22  --   --  20 23  GLUCOSE 265*  --   --  101* 130*  BUN 11  --   --  7 7  CREATININE 0.48*  --   --  0.42*  0.43*  CALCIUM 9.4  --   --  9.1 8.9  MG  --   --  1.7  --   --   AST 27  --   --   --   --   ALT 24  --   --   --   --   ALKPHOS 121*  --   --   --   --   BILITOT 0.1*  --   --   --   --   PROT 7.2  --   --   --   --   ALBUMIN 3.1*  --   --   --   --   LATICACIDVEN 2.5*  --   --   --   --   TROPONINI <0.30  --   --   --   --   PHART  --  7.270*  --   --   --   PCO2ART  --  57.1*  --   --   --   PO2ART  --  424.0*  --   --   --     Recent Labs Lab 03/28/13 1217 03/28/13 1614 03/28/13 1949 03/29/13 03/29/13 0410  GLUCAP 71 137* 123* 131* 145*   CXR: Ct Head Wo Contrast  03/28/2013   *RADIOLOGY REPORT*  Clinical Data:  Intubated patient.  Altered mental status. Seizure.  CT HEAD WITHOUT CONTRAST  Technique: Contiguous axial images were obtained from the base of the skull through the vertex without intravenous contrast.  Comparison:   None.  Findings:  No mass lesion, mass effect, midline shift, hydrocephalus, hemorrhage.  No  territorial ischemia or acute infarction.  Paranasal sinuses and mastoid air cells appear within normal limits.  Hyperostosis of the calvarium is incidentally noted.  Mild atrophy.  IMPRESSION: No acute intracranial abnormality.   Original Report Authenticated By: Andreas Newport, M.D.   Dg Chest Portable 1 View  03/28/2013   *RADIOLOGY REPORT*  Clinical Data: endotracheal intubation.  PORTABLE CHEST - 1 VIEW  Comparison: None.  Findings: Endotracheal tube tip is 31 mm from the carina, in good position.  Low volume chest with right greater than left basilar atelectasis.  No airspace disease.  No effusion.  Monitoring leads are projected over the chest.  IMPRESSION:  1. Endotracheal tube tip 31 mm from the carina. 2.  Low volume chest.   Original Report Authenticated By: Andreas Newport, M.D.    ASSESSMENT / PLAN:  PULMONARY A:  Acute respiratory failure in setting of status epilepticus. P:   Planning for extubation today  CARDIOVASCULAR A: hemodynamically stable.  No arrhythmia / ischemia.  H/O HTN P:  HTN currently well controlled Holding Antihypertensives currently  RENAL A:  Mild hyponatremia - resolved Hypokalemia  P:   Daily BMP IVF - 1/2 NS @ 50 Repleting K  GASTROINTESTINAL A:  H/o Hep A,B,C. P:   Cont tube feeds while intubated Protonix for GI Px  HEMATOLOGIC A:  Anemia. P:  Trend CBC Heparin for DVT Px  INFECTIOUS A:  No overt source of infection. P:   Cultures as above No abx currently  ENDOCRINE  A:  DM.  P:   CBG's Q4 SSI  NEUROLOGIC A:  Status epilepticus.  H/o seizure disorder, schizophrenia. P:   Neurology following Continuing Keppra 1000 mg BID Continuing Dilantin - patient seizure free following dilantin load; Once patient extubated will increase Dilantin to 400 mg BID Continuing home lamictal and Seroquel  TODAY'S SUMMARY: Patient successfully  extubated.  SLP, PT/OT evaluations ordered.  Will start diet and PO meds once patient clear by  Speech.  Can be transferred out of ICU today.  Everlene Other DO Family Medicine PGY-2  I have interviewed and examined the patient and reviewed the database. I have formulated the assessment and plan as reflected in the note above with amendments made by me.   Billy Fischer, MD;  PCCM service; Mobile [phone]

## 2013-03-29 NOTE — Progress Notes (Signed)
Clinical Social Work Department BRIEF PSYCHOSOCIAL ASSESSMENT 03/29/2013  Patient:  [name],[name]     Account Number:  [id number]     Admit date:  03/28/2013  Clinical Social Worker:  Lourdes Sledge  Date/Time:  03/29/2013 03:03 PM  Referred by:  Care Management  Date Referred:  03/29/2013 Referred for  SNF Placement   Other Referral:   Interview type:  Family Other interview type:   CSW completed assessment with pt son [name] [phone]    PSYCHOSOCIAL DATA Living Status:  FACILITY Admitted from facility:  [facility] Level of care:  Skilled Nursing Facility Primary support name:  [name] [phone] Primary support relationship to patient:  CHILD, ADULT Degree of support available:   Pt son presents as main contact for pt.    CURRENT CONCERNS Current Concerns  Post-Acute Placement   Other Concerns:    SOCIAL WORK ASSESSMENT / PLAN Covering CSW informed that pt was admitted from [facility].    CSW unable to complete assessment with who is unable to follow commands. No family present in room, CSW completed assessment with pt son [name] [phone] over the phone. Pt son confirmed that pt was admitted from [facility] where pt has been for 1 week. Son confirmed the plan will be for pt to return to the facility to finish her rehab and eventually transfer to an ALF.    CSW to contact [facility] and confirm that pt is able to return when medically ready.   Assessment/plan status:  Psychosocial Support/Ongoing Assessment of Needs Other assessment/ plan:   Information/referral to community resources:   No resources needed at this time.    PATIENT'S/FAMILY'S RESPONSE TO PLAN OF CARE: Pt laying in bed however assessment completed with pt son [name] [phone] who is agreeable for pt to return to [facility].       Theresia Bough, MSW, Theresia Majors [phone]

## 2013-03-29 NOTE — Procedures (Signed)
Extubation Procedure Note  Patient Details:   Name: [name] DOB: 02/02/1946 MRN: [id number]   Airway Documentation:     Evaluation  O2 sats: stable throughout Complications: No apparent complications Patient did tolerate procedure well. Bilateral Breath Sounds: Clear;Diminished Suctioning: Oral;Airway Yes  Extubated to 3l/min [name] [name]   Newt Lukes 03/29/2013, 10:13 AM

## 2013-03-30 ENCOUNTER — Encounter (HOSPITAL_COMMUNITY): Payer: Self-pay | Admitting: *Deleted

## 2013-03-30 DIAGNOSIS — G40401 Other generalized epilepsy and epileptic syndromes, not intractable, with status epilepticus: Secondary | ICD-10-CM

## 2013-03-30 DIAGNOSIS — R4182 Altered mental status, unspecified: Secondary | ICD-10-CM

## 2013-03-30 LAB — PHENYTOIN LEVEL, TOTAL: Phenytoin Lvl: 21.6 ug/mL — ABNORMAL HIGH (ref 10.0–20.0)

## 2013-03-30 LAB — GLUCOSE, CAPILLARY
Glucose-Capillary: 132 mg/dL — ABNORMAL HIGH (ref 70–99)
Glucose-Capillary: 114 mg/dL — ABNORMAL HIGH (ref 70–99)
Glucose-Capillary: 99 mg/dL (ref 70–99)

## 2013-03-30 LAB — BASIC METABOLIC PANEL
BUN: 8 mg/dL (ref 6–23)
Chloride: 107 mEq/L (ref 96–112)
GFR calc Af Amer: >90 mL/min (ref >90)
GFR calc non Af Amer: >90 mL/min (ref >90)
Glucose, Bld: 109 mg/dL — ABNORMAL HIGH (ref 70–99)
Potassium: 4.1 mEq/L (ref 3.5–5.1)
Sodium: 140 mEq/L (ref 135–145)

## 2013-03-30 MED ORDER — PNEUMOCOCCAL VAC POLYVALENT 25 MCG/0.5ML IJ INJ
0.5000 mL | INJECTION | INTRAMUSCULAR | Status: AC
  Administered 2013-03-31: 0.5 mL via INTRAMUSCULAR
  Filled 2013-03-30: qty 0.5

## 2013-03-30 MED ORDER — POTASSIUM CHLORIDE CRYS ER 20 MEQ PO TBCR
40.0000 meq | EXTENDED_RELEASE_TABLET | Freq: Once | ORAL | Status: AC
  Administered 2013-03-30: 40 meq via ORAL
  Filled 2013-03-30: qty 2

## 2013-03-30 MED ORDER — PHENYTOIN SODIUM EXTENDED 100 MG PO CAPS
400.0000 mg | ORAL_CAPSULE | Freq: Two times a day (BID) | ORAL | Status: DC
  Administered 2013-03-31: 400 mg via ORAL
  Filled 2013-03-30 (×2): qty 4

## 2013-03-30 NOTE — Progress Notes (Signed)
Subjective: Patient extubated.  Awake and alert.  No further seizures noted.    Objective: Current vital signs: BP 144/92  Pulse 118  Temp(Src) 98.7 F (37.1 C) (Oral)  Resp 14  Ht 5\' 6"  (1.676 m)  Wt 73.891 kg (162 lb 14.4 oz)  BMI 26.31 kg/m2  SpO2 100% Vital signs in last 24 hours: Temp:  [98.3 F (36.8 C)-99.5 F (37.5 C)] 98.7 F (37.1 C) (08/01 0829) Pulse Rate:  [109-125] 118 (08/01 0900) Resp:  [14-23] 14 (08/01 0900) BP: (120-145)/(62-92) 144/92 mmHg (08/01 0829) SpO2:  [99 %-100 %] 100 % (08/01 0900) Weight:  [73.891 kg (162 lb 14.4 oz)] 73.891 kg (162 lb 14.4 oz) (07/31 1728)  Intake/Output from previous day: 07/31 0701 - 08/01 0700 In: 586.2 [I.V.:380.8; NG/GT:101.3; IV Piggyback:104] Out: 2001 [Urine:2000; Stool:1] Intake/Output this shift: Total I/O In: 280 [P.O.:240; I.V.:40] Out: -  Nutritional status: Carb Control  Neurologic Exam: Mental Status: Alert and awake.  Speech fluent without evidence of aphasia.  Able to follow 3 step commands without difficulty. Cranial Nerves: II: Discs flat bilaterally; Visual fields grossly normal, pupils equal, round, reactive to light and accommodation III,IV, VI: ptosis not present, extra-ocular motions intact bilaterally V,VII: smile symmetric, facial light touch sensation normal bilaterally VIII: hearing normal bilaterally IX,X: gag reflex present XI: bilateral shoulder shrug XII: midline tongue extension Motor: Able to lift both arms above her head Sensory: Pinprick and light touch intact throughout, bilaterally Deep Tendon Reflexes: 2+ with absent AJ's bilaterally  Lab Results: Basic Metabolic Panel:  Recent Labs Lab 03/28/13 0327 03/28/13 0445 03/28/13 1425 03/29/13 0555 03/30/13 0415  NA 134*  --  136 141 140  K 4.2  --  4.4 3.4* 4.1  CL 99  --  103 109 107  CO2 22  --  20 23 22   GLUCOSE 265*  --  101* 130* 109*  BUN 11  --  7 7 8   CREATININE 0.48*  --  0.42* 0.43* 0.42*  CALCIUM 9.4  --  9.1  8.9 9.0  MG  --  1.7  --   --   --     Liver Function Tests:  Recent Labs Lab 03/28/13 0327  AST 27  ALT 24  ALKPHOS 121*  BILITOT 0.1*  PROT 7.2  ALBUMIN 3.1*   No results found for this basename: LIPASE, AMYLASE,  in the last 168 hours No results found for this basename: AMMONIA,  in the last 168 hours  CBC:  Recent Labs Lab 03/28/13 0327 03/28/13 1425 03/29/13 0555  WBC 7.3 9.7 7.6  NEUTROABS 4.0  --   --   HGB 10.9* 10.5* 9.4*  HCT 32.8* 31.7* 28.6*  MCV 103.8* 102.6* 103.2*  PLT 239 200 184    Cardiac Enzymes:  Recent Labs Lab 03/28/13 0327  TROPONINI <0.30    Lipid Panel: No results found for this basename: CHOL, TRIG, HDL, CHOLHDL, VLDL, LDLCALC,  in the last 168 hours  CBG:  Recent Labs Lab 03/29/13 0410 03/29/13 1209 03/29/13 1542 03/29/13 2123 03/30/13 0750  GLUCAP 145* 90 176* 175* 132*    Microbiology: Results for orders placed during the hospital encounter of 03/28/13  CULTURE, BLOOD (ROUTINE X 2)     Status: None   Collection Time    03/28/13  4:30 AM      Result Value Range Status   Specimen Description BLOOD LEFT ARM   Final   Special Requests BOTTLES DRAWN AEROBIC ONLY 5CC   Final  Culture  Setup Time 03/28/2013 09:47   Final   Culture     Final   Value:        BLOOD CULTURE RECEIVED NO GROWTH TO DATE CULTURE WILL BE HELD FOR 5 DAYS BEFORE ISSUING A FINAL NEGATIVE REPORT   Report Status PENDING   Incomplete  URINE CULTURE     Status: None   Collection Time    03/28/13  4:30 AM      Result Value Range Status   Specimen Description URINE, CATHETERIZED   Final   Special Requests NONE   Final   Culture  Setup Time 03/28/2013 09:57   Final   Colony Count NO GROWTH   Final   Culture NO GROWTH   Final   Report Status 03/29/2013 FINAL   Final  CULTURE, BLOOD (ROUTINE X 2)     Status: None   Collection Time    03/28/13  4:40 AM      Result Value Range Status   Specimen Description BLOOD LEFT HAND   Final   Special Requests  BOTTLES DRAWN AEROBIC ONLY 4CC   Final   Culture  Setup Time 03/28/2013 09:47   Final   Culture     Final   Value:        BLOOD CULTURE RECEIVED NO GROWTH TO DATE CULTURE WILL BE HELD FOR 5 DAYS BEFORE ISSUING A FINAL NEGATIVE REPORT   Report Status PENDING   Incomplete  MRSA PCR SCREENING     Status: None   Collection Time    03/28/13  6:48 AM      Result Value Range Status   MRSA by PCR NEGATIVE  NEGATIVE Final   Comment:            The GeneXpert MRSA Assay (FDA     approved for NASAL specimens     only), is one component of a     comprehensive MRSA colonization     surveillance program. It is not     intended to diagnose MRSA     infection nor to guide or     monitor treatment for     MRSA infections.    Coagulation Studies: No results found for this basename: LABPROT, INR,  in the last 72 hours  Imaging: No results found.  Medications:  I have reviewed the patient's current medications. Scheduled: . antiseptic oral rinse  15 mL Mouth Rinse BID  . aspirin  81 mg Oral Daily  . enoxaparin (LOVENOX) injection  40 mg Subcutaneous Q24H  . insulin aspart  0-9 Units Subcutaneous TID WC  . insulin aspart  3 Units Subcutaneous TID WC  . lamoTRIgine  150 mg Oral QHS  . levETIRAcetam  1,000 mg Oral BID  . phenytoin  400 mg Oral BID  . QUEtiapine  200 mg Oral QHS    Assessment/Plan: Patient seems at baseline.  No further seizure activity noted.  On Keppra, Lamictal and Dilantin.  Recommendations: 1.  Dilantin level 2.  Continue current medications 3.  Continue seizure precautions   LOS: 2 days   Thana Farr, MD [facility] [phone] 03/30/2013  10:04 AM

## 2013-03-30 NOTE — Progress Notes (Signed)
No seizure activity. Pt OOB in chair throughout shift. Continent of urine.

## 2013-03-30 NOTE — Progress Notes (Signed)
Pt transferred to 5W08. Report called to nurse. Pt stable.

## 2013-03-30 NOTE — Progress Notes (Signed)
PULMONARY  / CRITICAL CARE MEDICINE  Name: [name] MRN: [id number] DOB: [birth year]    ADMISSION DATE:  03/28/2013 CONSULTATION DATE:  03/28/2013  REFERRING MD :  EDP PRIMARY SERVICE:  PCCM  CHIEF COMPLAINT:  Status epilepticus  BRIEF PATIENT DESCRIPTION: 67 yo with past medical history of schizophrenia and seizure disorder (on Dilantin and Keppra) brought to ED with status epilepticus. Per EMS, patient had been seizing for 25-30 mins prior to arrival.  EMS gave 5 mg of Versed with resolution of seizure.  Patient was then brought to the ED.  In the ED patient was intubated for airway protection.  Patient was also given IV Keppra 1000 mg x 1.  PCCM was then consulted.  One month ago treated in outside hospital for similar presentation.  SIGNIFICANT EVENTS / STUDIES:  7/30  Head CT >>> NAD 7/30  Additional seizure activity; Given Ativan and Dilantin load 7/30  EEG - This is an abnormal EEG secondary to hemispheric asymmetry and right temporal sharp transients. This is consistent with the patient's history of seizures. No evidence of nonconvulsive seizure activity is noted. 7/31 patient extubated   LINES / TUBES: OETT 7/30 >>>7/31 OGT 7/30 >>>7/31 Foley 7/30 >>>8/1  CULTURES: 7/30 Blood >>> 7/30 Urine >>>ng  ANTIBIOTICS:  SUBJECTIVE: no distress   VITAL SIGNS: Temp:  [98.3 F (36.8 C)-99.5 F (37.5 C)] 98.7 F (37.1 C) (08/01 0829) Pulse Rate:  [109-125] 118 (08/01 0900) Resp:  [14-23] 14 (08/01 0900) BP: (120-145)/(62-92) 144/92 mmHg (08/01 0829) SpO2:  [99 %-100 %] 100 % (08/01 0900) Weight:  [162 lb 14.4 oz (73.891 kg)] 162 lb 14.4 oz (73.891 kg) (07/31 1728)  Intake/Output     07/31 0701 - 08/01 0700 08/01 0701 - 08/02 0700   P.O.  240   I.V. (mL/kg) 380.8 (5.2) 40 (0.5)   NG/GT 101.3    IV Piggyback 104    Total Intake(mL/kg) 586.2 (7.9) 280 (3.8)   Urine (mL/kg/hr) 2000 (1.1)    Stool 1 (0)    Total Output 2001     Net -1414.8 +280           HEMODYNAMICS:   VENTILATOR SETTINGS:    INTAKE / OUTPUT: Intake/Output     07/31 0701 - 08/01 0700 08/01 0701 - 08/02 0700   P.O.  240   I.V. (mL/kg) 380.8 (5.2) 40 (0.5)   NG/GT 101.3    IV Piggyback 104    Total Intake(mL/kg) 586.2 (7.9) 280 (3.8)   Urine (mL/kg/hr) 2000 (1.1)    Stool 1 (0)    Total Output 2001     Net -1414.8 +280         PHYSICAL EXAMINATION: General:  Awake, no distress Neuro: Awake, alert and following commands this am. HEENT:  PERRL Cardiovascular:  RRR, no m/r/g Lungs:  CTAB Abdomen:  Soft, nontender, nondistended. Musculoskeletal:  Moves all extremities, no edema Skin:  Intact  LABS:  Recent Labs Lab 03/28/13 0327 03/28/13 0400 03/28/13 0445 03/28/13 1425 03/29/13 0555 03/30/13 0415  HGB 10.9*  --   --  10.5* 9.4*  --   WBC 7.3  --   --  9.7 7.6  --   PLT 239  --   --  200 184  --   NA 134*  --   --  136 141 140  K 4.2  --   --  4.4 3.4* 4.1  CL 99  --   --  103 109 107  CO2 22  --   --  20 23 22   GLUCOSE 265*  --   --  101* 130* 109*  BUN 11  --   --  7 7 8   CREATININE 0.48*  --   --  0.42* 0.43* 0.42*  CALCIUM 9.4  --   --  9.1 8.9 9.0  MG  --   --  1.7  --   --   --   AST 27  --   --   --   --   --   ALT 24  --   --   --   --   --   ALKPHOS 121*  --   --   --   --   --   BILITOT 0.1*  --   --   --   --   --   PROT 7.2  --   --   --   --   --   ALBUMIN 3.1*  --   --   --   --   --   LATICACIDVEN 2.5*  --   --   --   --   --   TROPONINI <0.30  --   --   --   --   --   PHART  --  7.270*  --   --   --   --   PCO2ART  --  57.1*  --   --   --   --   PO2ART  --  424.0*  --   --   --   --     Recent Labs Lab 03/29/13 0410 03/29/13 1209 03/29/13 1542 03/29/13 2123 03/30/13 0750  GLUCAP 145* 90 176* 175* 132*   CXR: 7/30 - low volume chest, ETT tip 31 mm from carina  ASSESSMENT / PLAN:  PULMONARY A:  Acute respiratory failure in setting of status epilepticus - resolved P:  IS  CARDIOVASCULAR A: hemodynamically  stable.  No arrhythmia / ischemia.   H/O HTN P:  HTN currently well controlled Holding Antihypertensives currently as BP wnl Consider dc tele  RENAL  A:  Mild hyponatremia - resolved Hypokalemia  P:   Daily BMP IVF - 1/2 NS @ 50--> kvo replace K   GASTROINTESTINAL A:  H/o Hep A,B,C. P:   Protonix for GI Px Carb modified diet tolerated  HEMATOLOGIC A:  Anemia. P:  Trend CBC Lovenox for DVT Px- dc if ambulation successful today  INFECTIOUS A:  No overt source of infection. P:   Cultures as above No abx currently  ENDOCRINE  A:  DM.  P:   CBG's Q4 SSI  NEUROLOGIC A:  Status epilepticus.  H/o seizure disorder, schizophrenia. P:   Neurology following Continuing Keppra 1000 mg BID Continuing Dilantin - patient seizure free following dilantin load; Once patient extubated will increase Dilantin to 400 mg BID Continuing home lamictal and Seroquel  TODAY'S SUMMARY: to floor, may be dc in am after d/w neuro, replace k , dc tele  Leo Rod, PA-S  I have fully examined this patient and agree with above findings.    And edited infull  Mcarthur Rossetti. Tyson Alias, MD, FACP Pgr: [phone] [facility] \

## 2013-03-30 NOTE — Evaluation (Signed)
Physical Therapy Evaluation Patient Details Name: [name] MRN: [id number] DOB: 12-Jan-1946 Today's Date: 03/30/2013 Time: 7829-5621 PT Time Calculation (min): 21 min  PT Assessment / Plan / Recommendation History of Present Illness  Patient ia a 67 yo female admitted from [facility] with seizures.  Was intubated in ER. Patient extubated 03/29/13.  Clinical Impression  Patient was alert but confused at times.  Was able to ambulate with mod assist with RW.  Will benefit from acute PT to maximize independence prior to discharge.  Recommend return to SNF for continued therapy at discharge.    PT Assessment  Patient needs continued PT services    Follow Up Recommendations  SNF    Does the patient have the potential to tolerate intense rehabilitation      Barriers to Discharge        Equipment Recommendations  Rolling walker with 5" wheels    Recommendations for Other Services     Frequency Min 2X/week    Precautions / Restrictions Precautions Precautions: Fall Restrictions Weight Bearing Restrictions: No   Pertinent Vitals/Pain       Mobility  Bed Mobility Bed Mobility: Not assessed Transfers Transfers: Sit to Stand;Stand to Sit Sit to Stand: 4: Min assist;With upper extremity assist;With armrests;From chair/3-in-1 Stand to Sit: 4: Min assist;With upper extremity assist;With armrests;To chair/3-in-1 Details for Transfer Assistance: Verbal cues for hand placement.  Cues and assist for balance and safety. Ambulation/Gait Ambulation/Gait Assistance: 3: Mod assist Ambulation Distance (Feet): 86 Feet Assistive device: Rolling walker Ambulation/Gait Assistance Details: Verbal cues to stand upright and stay close to RW.  Patient with flexed posture, with RW too far ahead of her.  Physical assist to maneuver RW safely.  Toward end of ambulation, patient became fatigued and lost balance, requiring max assist to regain balance and initiate ambulation to  chair.  Verbal cues to reach back for chair before sitting. Gait Pattern: Step-through pattern;Decreased stride length;Shuffle;Trunk flexed Gait velocity: Slow gait speed    Exercises     PT Diagnosis: Difficulty walking;Generalized weakness;Altered mental status  PT Problem List: Decreased strength;Decreased activity tolerance;Decreased balance;Decreased mobility;Decreased cognition;Decreased knowledge of use of DME;Decreased safety awareness;Cardiopulmonary status limiting activity PT Treatment Interventions: DME instruction;Gait training;Functional mobility training;Cognitive remediation;Patient/family education     PT Goals(Current goals can be found in the care plan section) Acute Rehab PT Goals Patient Stated Goal: To get stronger PT Goal Formulation: With patient Time For Goal Achievement: 04/13/13 Potential to Achieve Goals: Good  Visit Information  Last PT Received On: 03/30/13 Assistance Needed: +1 History of Present Illness: Patient ia a 67 yo female admitted from [facility] with seizures.  Was intubated in ER. Patient extubated 03/29/13.       Prior Functioning  Home Living Family/patient expects to be discharged to:: Skilled nursing facility Prior Function Level of Independence: Needs assistance Comments: Unsure of amount of assist needed at SNF pta.  Patient unreliable at this time. Communication Communication: No difficulties    Cognition  Cognition Arousal/Alertness: Awake/alert Behavior During Therapy: WFL for tasks assessed/performed Overall Cognitive Status: No family/caregiver present to determine baseline cognitive functioning Area of Impairment: Memory;Safety/judgement;Problem solving Memory: Decreased short-term memory Safety/Judgement: Decreased awareness of safety;Decreased awareness of deficits Problem Solving: Decreased initiation;Difficulty sequencing;Requires verbal cues General Comments: Patient unable to recall where she was  pta.  Unable to give PLOF information.    Extremity/Trunk Assessment Upper Extremity Assessment Upper Extremity Assessment: Overall WFL for tasks assessed Lower Extremity Assessment Lower Extremity Assessment:  Generalized weakness   Balance    End of Session PT - End of Session Equipment Utilized During Treatment: Gait belt Activity Tolerance: Patient limited by fatigue Patient left: in chair;with call bell/phone within reach Nurse Communication: Mobility status  GP     Vena Austria 03/30/2013, 4:04 PM Durenda Hurt. Renaldo Fiddler, [facility] Acute Rehab Services Pager [phone]

## 2013-03-30 NOTE — Progress Notes (Signed)
Received alert for phenytoin level > 20, corrects to 30 (given albumin of 3.1).  Suspect high level due to bolus dose given 7/30.  Received total of 1100 mg of phenytoin on 7/30, and 1000 mg on 7/31.  Not yet at steady state.  Paged neuro to consider holding tonight's dose given supratherapeutic level. Dr. Leroy Kennedy on call, says OK to hold tonight's dose, f/u with rounding neurologist tomorrow for further dosage adjustment.  Tad Moore, BCPS  Clinical Pharmacist Pager [phone]  03/30/2013 8:19 PM

## 2013-03-30 NOTE — Clinical Social Work Note (Signed)
CSW was notified by [facility] that pt would be ready for dc on Saturday.  CSW called and left message at [facility] to advise.  FL2 requiring signature still.  Rokkell will be Admission's Coordinator for the weekend and can be reached at [phone].  Vickii Penna, LCSWA [phone]  Clinical Social Work

## 2013-03-31 DIAGNOSIS — D649 Anemia, unspecified: Secondary | ICD-10-CM

## 2013-03-31 LAB — BASIC METABOLIC PANEL
CO2: 21 mEq/L (ref 19–32)
Calcium: 9.5 mg/dL (ref 8.4–10.5)
Creatinine, Ser: 0.38 mg/dL — ABNORMAL LOW (ref 0.50–1.10)
GFR calc non Af Amer: >90 mL/min (ref >90)
Glucose, Bld: 131 mg/dL — ABNORMAL HIGH (ref 70–99)
Sodium: 139 mEq/L (ref 135–145)

## 2013-03-31 LAB — GLUCOSE, CAPILLARY
Glucose-Capillary: 136 mg/dL — ABNORMAL HIGH (ref 70–99)
Glucose-Capillary: 116 mg/dL — ABNORMAL HIGH (ref 70–99)

## 2013-03-31 MED ORDER — NICOTINE 21 MG/24HR TD PT24
21.0000 mg | MEDICATED_PATCH | Freq: Every day | TRANSDERMAL | Status: DC
  Administered 2013-03-31 – 2013-04-02 (×3): 21 mg via TRANSDERMAL
  Filled 2013-03-31 (×3): qty 1

## 2013-03-31 MED ORDER — ENOXAPARIN SODIUM 40 MG/0.4ML ~~LOC~~ SOLN
40.0000 mg | SUBCUTANEOUS | Status: DC
  Administered 2013-04-01 – 2013-04-02 (×2): 40 mg via SUBCUTANEOUS
  Filled 2013-03-31 (×2): qty 0.4

## 2013-03-31 MED ORDER — METOPROLOL TARTRATE 12.5 MG HALF TABLET
12.5000 mg | ORAL_TABLET | Freq: Two times a day (BID) | ORAL | Status: DC
  Administered 2013-03-31 – 2013-04-02 (×4): 12.5 mg via ORAL
  Filled 2013-03-31 (×5): qty 1

## 2013-03-31 NOTE — Progress Notes (Signed)
PULMONARY  / CRITICAL CARE MEDICINE  Name: [name] MRN: [id number] DOB: 13-May-1946    ADMISSION DATE:  03/28/2013 CONSULTATION DATE:  03/28/2013  REFERRING MD :  EDP PRIMARY SERVICE:  PCCM  CHIEF COMPLAINT:  Status epilepticus  BRIEF PATIENT DESCRIPTION: 67 yo with past medical history of schizophrenia and seizure disorder (on Dilantin and Keppra) brought to ED with status epilepticus. Per EMS, patient had been seizing for 25-30 mins prior to arrival.  EMS gave 5 mg of Versed with resolution of seizure.  Patient was then brought to the ED.  In the ED patient was intubated for airway protection.  Patient was also given IV Keppra 1000 mg x 1.  PCCM was then consulted.  One month ago treated in outside hospital for similar presentation.  SIGNIFICANT EVENTS / STUDIES:  7/30  Head CT >>> NAD 7/30  Additional seizure activity; Given Ativan and Dilantin load 7/30  EEG - This is an abnormal EEG secondary to hemispheric asymmetry and right temporal sharp transients. This is consistent with the patient's history of seizures. No evidence of nonconvulsive seizure activity is noted. 7/31 patient extubated   LINES / TUBES: OETT 7/30 >>>7/31 OGT 7/30 >>>7/31 Foley 7/30 >>>8/1  CULTURES: 7/30 Blood >>> 7/30 Urine >>>ng     SUBJECTIVE: nad but can't tell me where she lives   VITAL SIGNS: Temp:  [98.5 F (36.9 C)-98.7 F (37.1 C)] 98.5 F (36.9 C) (08/02 1349) Pulse Rate:  [120-128] 128 (08/02 1349) Resp:  [17-18] 18 (08/02 1349) BP: (117-157)/(74-96) 117/78 mmHg (08/02 1349) SpO2:  [97 %-100 %] 97 % (08/02 1349) Weight:  [151 lb 3.8 oz (68.6 kg)] 151 lb 3.8 oz (68.6 kg) (08/02 0800)  Intake/Output     08/01 0701 - 08/02 0700 08/02 0701 - 08/03 0700   P.O. 680 120   I.V. (mL/kg)  820 (12)   NG/GT     IV Piggyback     Total Intake(mL/kg) 680 (9.2) 940 (13.7)   Urine (mL/kg/hr) 1125 (0.6)    Stool 1 (0)    Total Output 1126     Net -446 +940        Urine Occurrence 1 x 4 x    Stool Occurrence  1 x     HEMODYNAMICS:   VENTILATOR SETTINGS:    INTAKE / OUTPUT: Intake/Output     08/01 0701 - 08/02 0700 08/02 0701 - 08/03 0700   P.O. 680 120   I.V. (mL/kg)  820 (12)   NG/GT     IV Piggyback     Total Intake(mL/kg) 680 (9.2) 940 (13.7)   Urine (mL/kg/hr) 1125 (0.6)    Stool 1 (0)    Total Output 1126     Net -446 +940        Urine Occurrence 1 x 4 x   Stool Occurrence  1 x    PHYSICAL EXAMINATION: General:  Awake, no distress Neuro: Awake, alert "don't turn my TV off, I'm watching it". HEENT:  PERRL,  edentulous Cardiovascular:  RRR, no m/r/g Lungs:  CTAB Abdomen:  Soft, nontender, nondistended. Musculoskeletal:  Moves all extremities, no edema Skin:  Intact  LABS:  Recent Labs Lab 03/28/13 0327 03/28/13 0400 03/28/13 0445 03/28/13 1425 03/29/13 0555 03/30/13 0415 03/31/13 0645  HGB 10.9*  --   --  10.5* 9.4*  --   --   WBC 7.3  --   --  9.7 7.6  --   --   PLT 239  --   --  200 184  --   --   NA 134*  --   --  136 141 140 139  K 4.2  --   --  4.4 3.4* 4.1 4.2  CL 99  --   --  103 109 107 103  CO2 22  --   --  20 23 22 21   GLUCOSE 265*  --   --  101* 130* 109* 131*  BUN 11  --   --  7 7 8 7   CREATININE 0.48*  --   --  0.42* 0.43* 0.42* 0.38*  CALCIUM 9.4  --   --  9.1 8.9 9.0 9.5  MG  --   --  1.7  --   --   --  1.6  AST 27  --   --   --   --   --   --   ALT 24  --   --   --   --   --   --   ALKPHOS 121*  --   --   --   --   --   --   BILITOT 0.1*  --   --   --   --   --   --   PROT 7.2  --   --   --   --   --   --   ALBUMIN 3.1*  --   --   --   --   --   --   LATICACIDVEN 2.5*  --   --   --   --   --   --   TROPONINI <0.30  --   --   --   --   --   --   PHART  --  7.270*  --   --   --   --   --   PCO2ART  --  57.1*  --   --   --   --   --   PO2ART  --  424.0*  --   --   --   --   --     Recent Labs Lab 03/30/13 1158 03/30/13 1713 03/30/13 2049 03/31/13 0752 03/31/13 1217  GLUCAP 114* 99 179* 136* 116*   CXR: 7/30  - low volume chest, ETT tip 31 mm from carina  ASSESSMENT / PLAN:  PULMONARY A:  Acute respiratory failure in setting of status epilepticus - resolved P:  IS  CARDIOVASCULAR A: hemodynamically stable.  No arrhythmia / ischemia.   H/O HTN P:  HTN currently well controlled Holding Antihypertensives currently as BP wnl    RENAL  A:  Mild hyponatremia - resolved Hypokalemia   Recent Labs Lab 03/29/13 0555 03/30/13 0415 03/31/13 0645  NA 141 140 139  K 3.4* 4.1 4.2  CL 109 107 103  CO2 23 22 21   BUN 7 8 7   CREATININE 0.43* 0.42* 0.38*  GLUCOSE 130* 109* 131*     P:  No further issues   GASTROINTESTINAL A:  H/o Hep A,B,C. P:   Protonix for GI Px Carb modified diet tolerated  HEMATOLOGIC  Recent Labs Lab 03/28/13 0327 03/28/13 1425 03/29/13 0555  HGB 10.9* 10.5* 9.4*    A:  Anemia. P:  Trend CBC Lovenox for DVT Px- dc if ambulation successful   INFECTIOUS A:  No overt source of infection. P:   Cultures as above No abx currently  ENDOCRINE  A:  DM.  P:   CBG's Q4 SSI  NEUROLOGIC A:  Status epilepticus.  H/o seizure disorder, schizophrenia. P:   Neurology following Continuing Keppra 1000 mg BID Continuing Dilantin -   load;  Continuing home lamictal and Seroquel   Not confident she's able to manage her own affairs at this point much less keep up with complex med rx;   Sandrea Hughs, MD Pulmonary and Critical Care Medicine [facility] Cell [phone] After 5:30 PM or weekends, call [phone]

## 2013-03-31 NOTE — Progress Notes (Signed)
[name] is a 67 y.o. female patient who transferred  from 42 @ 2030, awake, alert  & orientated to self, place and time, Full Code, VS -Blood pressure 155/96, pulse 127, resp. rate 18, height 5\' 6"  (1.676 m), weight 68.6 kg (151 lb 3.8 oz), SpO2 98.00% RA, Patient refused to allow staff to obtain her temp, no c/o shortness of breath, no c/o chest pain, no distress noted. Continuous pulse oximetry setup on telemetry # [id number] sat 98% and pt is currently running:sinus tachycardia.   IV site WDL: hand right, condition patent and no redness with a transparent dsg that's clean dry and intact.  Allergies:   Allergies  Allergen Reactions  . Depakote (Valproic Acid)   . Haldol (Haloperidol Lactate)   . Penicillins   . Shellfish Allergy   . Trileptal (Oxcarbazepine)      Past Medical History  Diagnosis Date  . CHF (congestive heart failure)   . Seizures   . Schizophrenia   . Diabetes mellitus without complication   . Hypertension   . Asthma   . Hepatitis A   . Hepatitis B   . Hepatitis C   . Respiratory failure   . Smoker     1 pack daily    Pt orientation to unit, room and routine. SR up x 2, fall risk assessment complete with Patient and family verbalizing understanding of risks associated with falls. Pt verbalizes an understanding of how to use the call bell and to call for help before getting out of bed. Patient has a skin tear to the RPFA with a foam dressing in place, sacrum is clean-dry- intact. Seizure precautions initiated with suction set up at bedside.    Will cont to monitor and assist as needed.  Julien Nordmann [facility], RN 03/30/2013 9:00 PM

## 2013-03-31 NOTE — Progress Notes (Signed)
Subjective: Patient has had no further seizures.  On Dilantin.  Corrected level of 30.  Last night's dose was held.    Objective: Current vital signs: BP 117/78  Pulse 128  Temp(Src) 98.5 F (36.9 C) (Oral)  Resp 18  Ht 5\' 6"  (1.676 m)  Wt 68.6 kg (151 lb 3.8 oz)  BMI 24.42 kg/m2  SpO2 97% Vital signs in last 24 hours: Temp:  [98.5 F (36.9 C)] 98.5 F (36.9 C) (08/02 1349) Pulse Rate:  [120-128] 128 (08/02 1349) Resp:  [17-18] 18 (08/02 1349) BP: (117-155)/(74-96) 117/78 mmHg (08/02 1349) SpO2:  [97 %] 97 % (08/02 1349) Weight:  [68.6 kg (151 lb 3.8 oz)] 68.6 kg (151 lb 3.8 oz) (08/02 0800)  Intake/Output from previous day: 08/01 0701 - 08/02 0700 In: 680 [P.O.:680] Out: 1126 [Urine:1125; Stool:1] Intake/Output this shift: Total I/O In: 940 [P.O.:120; I.V.:820] Out: -  Nutritional status: Carb Control  Neurologic Exam: Mental Status:  Alert and awake. Speech fluent without evidence of aphasia. Able to follow 3 step commands without difficulty. TV volume on handheld and she thinks she is talking on the phone.  Will not let anyone touch her monitor. Cranial Nerves:  II: Discs flat bilaterally; Visual fields grossly normal, pupils equal, round, reactive to light and accommodation  III,IV, VI: ptosis not present, extra-ocular motions intact bilaterally  V,VII: smile symmetric, facial light touch sensation normal bilaterally  VIII: hearing normal bilaterally  IX,X: gag reflex present  XI: bilateral shoulder shrug  XII: midline tongue extension  Motor:  Able to lift both arms above her head  Sensory: Pinprick and light touch intact throughout, bilaterally  Deep Tendon Reflexes: 2+ with absent AJ's bilaterally   Lab Results: Basic Metabolic Panel:  Recent Labs Lab 03/28/13 0327 03/28/13 0445 03/28/13 1425 03/29/13 0555 03/30/13 0415 03/31/13 0645  NA 134*  --  136 141 140 139  K 4.2  --  4.4 3.4* 4.1 4.2  CL 99  --  103 109 107 103  CO2 22  --  20 23 22 21    GLUCOSE 265*  --  101* 130* 109* 131*  BUN 11  --  7 7 8 7   CREATININE 0.48*  --  0.42* 0.43* 0.42* 0.38*  CALCIUM 9.4  --  9.1 8.9 9.0 9.5  MG  --  1.7  --   --   --  1.6    Liver Function Tests:  Recent Labs Lab 03/28/13 0327  AST 27  ALT 24  ALKPHOS 121*  BILITOT 0.1*  PROT 7.2  ALBUMIN 3.1*   No results found for this basename: LIPASE, AMYLASE,  in the last 168 hours No results found for this basename: AMMONIA,  in the last 168 hours  CBC:  Recent Labs Lab 03/28/13 0327 03/28/13 1425 03/29/13 0555  WBC 7.3 9.7 7.6  NEUTROABS 4.0  --   --   HGB 10.9* 10.5* 9.4*  HCT 32.8* 31.7* 28.6*  MCV 103.8* 102.6* 103.2*  PLT 239 200 184    Cardiac Enzymes:  Recent Labs Lab 03/28/13 0327  TROPONINI <0.30    Lipid Panel: No results found for this basename: CHOL, TRIG, HDL, CHOLHDL, VLDL, LDLCALC,  in the last 168 hours  CBG:  Recent Labs Lab 03/30/13 1158 03/30/13 1713 03/30/13 2049 03/31/13 0752 03/31/13 1217  GLUCAP 114* 99 179* 136* 116*    Microbiology: Results for orders placed during the hospital encounter of 03/28/13  CULTURE, BLOOD (ROUTINE X 2)     Status:  None   Collection Time    03/28/13  4:30 AM      Result Value Range Status   Specimen Description BLOOD LEFT ARM   Final   Special Requests BOTTLES DRAWN AEROBIC ONLY 5CC   Final   Culture  Setup Time 03/28/2013 09:47   Final   Culture     Final   Value:        BLOOD CULTURE RECEIVED NO GROWTH TO DATE CULTURE WILL BE HELD FOR 5 DAYS BEFORE ISSUING A FINAL NEGATIVE REPORT   Report Status PENDING   Incomplete  URINE CULTURE     Status: None   Collection Time    03/28/13  4:30 AM      Result Value Range Status   Specimen Description URINE, CATHETERIZED   Final   Special Requests NONE   Final   Culture  Setup Time 03/28/2013 09:57   Final   Colony Count NO GROWTH   Final   Culture NO GROWTH   Final   Report Status 03/29/2013 FINAL   Final  CULTURE, BLOOD (ROUTINE X 2)     Status: None    Collection Time    03/28/13  4:40 AM      Result Value Range Status   Specimen Description BLOOD LEFT HAND   Final   Special Requests BOTTLES DRAWN AEROBIC ONLY 4CC   Final   Culture  Setup Time 03/28/2013 09:47   Final   Culture     Final   Value:        BLOOD CULTURE RECEIVED NO GROWTH TO DATE CULTURE WILL BE HELD FOR 5 DAYS BEFORE ISSUING A FINAL NEGATIVE REPORT   Report Status PENDING   Incomplete  MRSA PCR SCREENING     Status: None   Collection Time    03/28/13  6:48 AM      Result Value Range Status   MRSA by PCR NEGATIVE  NEGATIVE Final   Comment:            The GeneXpert MRSA Assay (FDA     approved for NASAL specimens     only), is one component of a     comprehensive MRSA colonization     surveillance program. It is not     intended to diagnose MRSA     infection nor to guide or     monitor treatment for     MRSA infections.    Coagulation Studies: No results found for this basename: LABPROT, INR,  in the last 72 hours  Imaging: No results found.  Medications:  I have reviewed the patient's current medications. Scheduled: . antiseptic oral rinse  15 mL Mouth Rinse BID  . aspirin  81 mg Oral Daily  . insulin aspart  0-9 Units Subcutaneous TID WC  . insulin aspart  3 Units Subcutaneous TID WC  . lamoTRIgine  150 mg Oral QHS  . levETIRAcetam  1,000 mg Oral BID  . QUEtiapine  200 mg Oral QHS    Assessment/Plan: No further seizure activity.  Dilantin level supratherapeutic.  On Lamictal and Keppra.  Recommendations: 1.  D/C Dilantin 2.  Dilantin level in AM.  Will resume once therapeutic at 400mg  BID   LOS: 3 days   Thana Farr, MD [facility] [phone] 03/31/2013  4:10 PM

## 2013-03-31 NOTE — Progress Notes (Signed)
eLink Physician-Brief Progress Note Patient Name: [name] DOB: [birth year] MRN: [id number]  Date of Service  03/31/2013   HPI/Events of Note   Transfer med rec  eICU Interventions   Metoprolol 12.5 BID restarted Nicotine patch ordered Lovenox for DVT Px restarted    Intervention Category Intermediate Interventions: Communication with other healthcare providers and/or family  Lonia Farber 03/31/2013, 4:27 PM

## 2013-04-01 DIAGNOSIS — R4182 Altered mental status, unspecified: Secondary | ICD-10-CM | POA: Diagnosis present

## 2013-04-01 LAB — CBC
MCH: 34.4 pg — ABNORMAL HIGH (ref 26.0–34.0)
MCHC: 33.8 g/dL (ref 30.0–36.0)
MCV: 101.5 fL — ABNORMAL HIGH (ref 78.0–100.0)
Platelets: 300 10*3/uL (ref 150–400)
RBC: 3.23 MIL/uL — ABNORMAL LOW (ref 3.87–5.11)
RDW: 14.5 % (ref 11.5–15.5)

## 2013-04-01 LAB — GLUCOSE, CAPILLARY
Glucose-Capillary: 108 mg/dL — ABNORMAL HIGH (ref 70–99)
Glucose-Capillary: 113 mg/dL — ABNORMAL HIGH (ref 70–99)

## 2013-04-01 LAB — PHENYTOIN LEVEL, TOTAL: Phenytoin Lvl: 17.7 ug/mL (ref 10.0–20.0)

## 2013-04-01 NOTE — Progress Notes (Signed)
Subjective: Patient pleasant.  More cooperative today.    Objective: Current vital signs: BP 119/79  Pulse 118  Temp(Src) 99.6 F (37.6 C) (Oral)  Resp 20  Ht 5\' 6"  (1.676 m)  Wt 68.3 kg (150 lb 9.2 oz)  BMI 24.31 kg/m2  SpO2 100% Vital signs in last 24 hours: Temp:  [98.2 F (36.8 C)-99.6 F (37.6 C)] 99.6 F (37.6 C) (08/03 1420) Pulse Rate:  [118-124] 118 (08/03 1420) Resp:  [16-20] 20 (08/03 1420) BP: (119-137)/(79-86) 119/79 mmHg (08/03 1420) SpO2:  [98 %-100 %] 100 % (08/03 1420) Weight:  [68.3 kg (150 lb 9.2 oz)] 68.3 kg (150 lb 9.2 oz) (08/03 0642)  Intake/Output from previous day: 08/02 0701 - 08/03 0700 In: 1120 [P.O.:120; I.V.:1000] Out: -  Intake/Output this shift: Total I/O In: 655 [P.O.:360; I.V.:295] Out: -  Nutritional status: Carb Control  Neurologic Exam: Mental Status:  Alert and awake. Speech fluent without evidence of aphasia. Able to follow 3 step commands without difficulty. Eating dinner. Cranial Nerves:  II: Discs flat bilaterally; Visual fields grossly normal, pupils equal, round, reactive to light and accommodation  III,IV, VI: ptosis not present, extra-ocular motions intact bilaterally  V,VII: smile symmetric, facial light touch sensation normal bilaterally  VIII: hearing normal bilaterally  IX,X: gag reflex present  XI: bilateral shoulder shrug  XII: midline tongue extension  Motor:  Able to lift both arms above her head  Sensory: Pinprick and light touch intact throughout, bilaterally  Deep Tendon Reflexes: 2+ with absent AJ's bilaterally   Lab Results: Basic Metabolic Panel:  Recent Labs Lab 03/28/13 0327 03/28/13 0445 03/28/13 1425 03/29/13 0555 03/30/13 0415 03/31/13 0645  NA 134*  --  136 141 140 139  K 4.2  --  4.4 3.4* 4.1 4.2  CL 99  --  103 109 107 103  CO2 22  --  20 23 22 21   GLUCOSE 265*  --  101* 130* 109* 131*  BUN 11  --  7 7 8 7   CREATININE 0.48*  --  0.42* 0.43* 0.42* 0.38*  CALCIUM 9.4  --  9.1 8.9  9.0 9.5  MG  --  1.7  --   --   --  1.6    Liver Function Tests:  Recent Labs Lab 03/28/13 0327  AST 27  ALT 24  ALKPHOS 121*  BILITOT 0.1*  PROT 7.2  ALBUMIN 3.1*   No results found for this basename: LIPASE, AMYLASE,  in the last 168 hours No results found for this basename: AMMONIA,  in the last 168 hours  CBC:  Recent Labs Lab 03/28/13 0327 03/28/13 1425 03/29/13 0555 04/01/13 0859  WBC 7.3 9.7 7.6 4.8  NEUTROABS 4.0  --   --   --   HGB 10.9* 10.5* 9.4* 11.1*  HCT 32.8* 31.7* 28.6* 32.8*  MCV 103.8* 102.6* 103.2* 101.5*  PLT 239 200 184 300    Cardiac Enzymes:  Recent Labs Lab 03/28/13 0327  TROPONINI <0.30    Lipid Panel: No results found for this basename: CHOL, TRIG, HDL, CHOLHDL, VLDL, LDLCALC,  in the last 168 hours  CBG:  Recent Labs Lab 03/31/13 1641 03/31/13 2051 04/01/13 0756 04/01/13 1157 04/01/13 1645  GLUCAP 95 108* 113* 126* 107*    Microbiology: Results for orders placed during the hospital encounter of 03/28/13  CULTURE, BLOOD (ROUTINE X 2)     Status: None   Collection Time    03/28/13  4:30 AM      Result Value  Range Status   Specimen Description BLOOD LEFT ARM   Final   Special Requests BOTTLES DRAWN AEROBIC ONLY 5CC   Final   Culture  Setup Time 03/28/2013 09:47   Final   Culture     Final   Value:        BLOOD CULTURE RECEIVED NO GROWTH TO DATE CULTURE WILL BE HELD FOR 5 DAYS BEFORE ISSUING A FINAL NEGATIVE REPORT   Report Status PENDING   Incomplete  URINE CULTURE     Status: None   Collection Time    03/28/13  4:30 AM      Result Value Range Status   Specimen Description URINE, CATHETERIZED   Final   Special Requests NONE   Final   Culture  Setup Time 03/28/2013 09:57   Final   Colony Count NO GROWTH   Final   Culture NO GROWTH   Final   Report Status 03/29/2013 FINAL   Final  CULTURE, BLOOD (ROUTINE X 2)     Status: None   Collection Time    03/28/13  4:40 AM      Result Value Range Status   Specimen  Description BLOOD LEFT HAND   Final   Special Requests BOTTLES DRAWN AEROBIC ONLY 4CC   Final   Culture  Setup Time 03/28/2013 09:47   Final   Culture     Final   Value:        BLOOD CULTURE RECEIVED NO GROWTH TO DATE CULTURE WILL BE HELD FOR 5 DAYS BEFORE ISSUING A FINAL NEGATIVE REPORT   Report Status PENDING   Incomplete  MRSA PCR SCREENING     Status: None   Collection Time    03/28/13  6:48 AM      Result Value Range Status   MRSA by PCR NEGATIVE  NEGATIVE Final   Comment:            The GeneXpert MRSA Assay (FDA     approved for NASAL specimens     only), is one component of a     comprehensive MRSA colonization     surveillance program. It is not     intended to diagnose MRSA     infection nor to guide or     monitor treatment for     MRSA infections.    Coagulation Studies: No results found for this basename: LABPROT, INR,  in the last 72 hours  Imaging: No results found.  Medications:  I have reviewed the patient's current medications. Scheduled: . antiseptic oral rinse  15 mL Mouth Rinse BID  . aspirin  81 mg Oral Daily  . enoxaparin (LOVENOX) injection  40 mg Subcutaneous Q24H  . insulin aspart  0-9 Units Subcutaneous TID WC  . insulin aspart  3 Units Subcutaneous TID WC  . lamoTRIgine  150 mg Oral QHS  . levETIRAcetam  1,000 mg Oral BID  . metoprolol tartrate  12.5 mg Oral BID  . nicotine  21 mg Transdermal Daily  . QUEtiapine  200 mg Oral QHS    Assessment/Plan: Patient without further seizure activity.  Dilantin level improved but remains high.  Level 17.7, corrects to 24.5.  Recommendations: 1.  Continue to hold Dilantin 2.  Dilantin level and albumin in AM.   LOS: 4 days   Thana Farr, MD [facility] [phone] 04/01/2013  5:50 PM

## 2013-04-01 NOTE — Progress Notes (Signed)
PULMONARY  / CRITICAL CARE MEDICINE  Name: [name] MRN: [id number] DOB: 1945/10/11    ADMISSION DATE:  03/28/2013 CONSULTATION DATE:  03/28/2013  REFERRING MD :  EDP PRIMARY SERVICE:  PCCM  CHIEF COMPLAINT:  Status epilepticus  BRIEF PATIENT DESCRIPTION: 67 yo with past medical history of schizophrenia and seizure disorder (on Dilantin and Keppra) brought to ED with status epilepticus. Per EMS, patient had been seizing for 25-30 mins prior to arrival.  EMS gave 5 mg of Versed with resolution of seizure.  Patient was then brought to the ED.  In the ED patient was intubated for airway protection.  Patient was also given IV Keppra 1000 mg x 1.  PCCM was then consulted.  One month ago treated in outside hospital for similar presentation.  SIGNIFICANT EVENTS / STUDIES:  7/30  Head CT >>> NAD 7/30  Additional seizure activity; Given Ativan and Dilantin load 7/30  EEG - This is an abnormal EEG secondary to hemispheric asymmetry and right temporal sharp transients. This is consistent with the patient's history of seizures. No evidence of nonconvulsive seizure activity is noted. 7/31 patient extubated   LINES / TUBES: OETT 7/30 >>>7/31 OGT 7/30 >>>7/31 Foley 7/30 >>>8/1  CULTURES: 7/30 Blood >>> 7/30 Urine >>>ng     SUBJECTIVE: nad but can't tell me where she lives, gets angry very easily with questions re discharge planning   VITAL SIGNS: Temp:  [98.2 F (36.8 C)-98.5 F (36.9 C)] 98.2 F (36.8 C) (08/03 0530) Pulse Rate:  [120-128] 120 (08/03 0530) Resp:  [16-18] 16 (08/03 0530) BP: (117-137)/(78-86) 131/83 mmHg (08/03 0530) SpO2:  [97 %-99 %] 99 % (08/03 0530) Weight:  [150 lb 9.2 oz (68.3 kg)] 150 lb 9.2 oz (68.3 kg) (08/03 0642)  Intake/Output     08/02 0701 - 08/03 0700 08/03 0701 - 08/04 0700   P.O. 120 240   I.V. (mL/kg) 1000 (14.6) 295 (4.3)   Total Intake(mL/kg) 1120 (16.4) 535 (7.8)   Urine (mL/kg/hr)     Stool     Total Output       Net +1120 +535         Urine Occurrence 7 x 1 x   Stool Occurrence 1 x       INTAKE / OUTPUT: Intake/Output     08/02 0701 - 08/03 0700 08/03 0701 - 08/04 0700   P.O. 120 240   I.V. (mL/kg) 1000 (14.6) 295 (4.3)   Total Intake(mL/kg) 1120 (16.4) 535 (7.8)   Urine (mL/kg/hr)     Stool     Total Output       Net +1120 +535        Urine Occurrence 7 x 1 x   Stool Occurrence 1 x     PHYSICAL EXAMINATION: General:  Awake, no distress Neuro: Awake, alert "don't turn my TV off, I'm watching it". HEENT:     edentulous Cardiovascular:  RRR, no m/r/g Lungs:  CTAB Abdomen:  Soft, nontender, nondistended. Musculoskeletal:  Moves all extremities, no edema Skin:  Intact  LABS:  Recent Labs Lab 03/28/13 0327 03/28/13 0400 03/28/13 0445 03/28/13 1425 03/29/13 0555 03/30/13 0415 03/31/13 0645 04/01/13 0859  HGB 10.9*  --   --  10.5* 9.4*  --   --  11.1*  WBC 7.3  --   --  9.7 7.6  --   --  4.8  PLT 239  --   --  200 184  --   --  300  NA  134*  --   --  136 141 140 139  --   K 4.2  --   --  4.4 3.4* 4.1 4.2  --   CL 99  --   --  103 109 107 103  --   CO2 22  --   --  20 23 22 21   --   GLUCOSE 265*  --   --  101* 130* 109* 131*  --   BUN 11  --   --  7 7 8 7   --   CREATININE 0.48*  --   --  0.42* 0.43* 0.42* 0.38*  --   CALCIUM 9.4  --   --  9.1 8.9 9.0 9.5  --   MG  --   --  1.7  --   --   --  1.6  --   AST 27  --   --   --   --   --   --   --   ALT 24  --   --   --   --   --   --   --   ALKPHOS 121*  --   --   --   --   --   --   --   BILITOT 0.1*  --   --   --   --   --   --   --   PROT 7.2  --   --   --   --   --   --   --   ALBUMIN 3.1*  --   --   --   --   --   --   --   LATICACIDVEN 2.5*  --   --   --   --   --   --   --   TROPONINI <0.30  --   --   --   --   --   --   --   PHART  --  7.270*  --   --   --   --   --   --   PCO2ART  --  57.1*  --   --   --   --   --   --   PO2ART  --  424.0*  --   --   --   --   --   --     Recent Labs Lab 03/31/13 1217 03/31/13 1641 03/31/13 2051  04/01/13 0756 04/01/13 1157  GLUCAP 116* 95 108* 113* 126*   CXR: 7/30 - low volume chest, ETT tip 31 mm from carina  ASSESSMENT / PLAN:  PULMONARY A:  Acute respiratory failure in setting of status epilepticus - resolved P:  IS  CARDIOVASCULAR A: hemodynamically stable.  No arrhythmia / ischemia.   H/O HTN P:  HTN currently well controlled Holding Antihypertensives currently as BP wnl    RENAL  A:  Mild hyponatremia - resolved Hypokalemia - resolved  Recent Labs Lab 03/29/13 0555 03/30/13 0415 03/31/13 0645  NA 141 140 139  K 3.4* 4.1 4.2  CL 109 107 103  CO2 23 22 21   BUN 7 8 7   CREATININE 0.43* 0.42* 0.38*  GLUCOSE 130* 109* 131*     P:  No further issues   GASTROINTESTINAL A:  H/o Hep A,B,C. P:   Protonix for GI Px Carb modified diet tolerated  HEMATOLOGIC  Recent Labs Lab 03/28/13 1425 03/29/13 0555 04/01/13 0859  HGB 10.5* 9.4*  11.1*    A:  Anemia. P:  Trend CBC Lovenox for DVT Px- dc/d 8/2 as pt ambulatory  INFECTIOUS A:  No overt source of infection. P:   Cultures as above No abx currently  ENDOCRINE  A:  DM.  P:   CBG's Q4 SSI  NEUROLOGIC A:  Status epilepticus.  H/o seizure disorder, schizophrenia. P:   Neurology following Continuing Keppra 1000 mg BID  Dilantin  rx per neuro Continuing home lamictal and Seroquel   Not confident she's able to manage her own affairs at this point much less keep up with complex med rx and she's convinced she's going home, not to NH- will need CM to sort out but clearly not ready for discharge at this point    Sandrea Hughs, MD Pulmonary and Critical Care Medicine [facility] Cell [phone] After 5:30 PM or weekends, call [phone]

## 2013-04-02 LAB — GLUCOSE, CAPILLARY
Glucose-Capillary: 109 mg/dL — ABNORMAL HIGH (ref 70–99)
Glucose-Capillary: 118 mg/dL — ABNORMAL HIGH (ref 70–99)
Glucose-Capillary: 141 mg/dL — ABNORMAL HIGH (ref 70–99)

## 2013-04-02 LAB — PHENYTOIN LEVEL, TOTAL: Phenytoin Lvl: 12.2 ug/mL (ref 10.0–20.0)

## 2013-04-02 MED ORDER — METOPROLOL TARTRATE 12.5 MG HALF TABLET: 12.5000 mg | ORAL_TABLET | Freq: Two times a day (BID) | ORAL | Status: DC

## 2013-04-02 MED ORDER — PHENYTOIN SODIUM EXTENDED 200 MG PO CAPS: 400.0000 mg | ORAL_CAPSULE | Freq: Every day | ORAL | Status: DC

## 2013-04-02 MED ORDER — LEVETIRACETAM 1000 MG PO TABS
1000.0000 mg | ORAL_TABLET | Freq: Two times a day (BID) | ORAL | Status: DC
Start: 2013-04-02 — End: 2013-07-05

## 2013-04-02 MED ORDER — PHENYTOIN SODIUM EXTENDED 100 MG PO CAPS
300.0000 mg | ORAL_CAPSULE | Freq: Every morning | ORAL | Status: DC
  Administered 2013-04-02: 300 mg via ORAL
  Filled 2013-04-02: qty 3

## 2013-04-02 MED ORDER — PHENYTOIN SODIUM EXTENDED 300 MG PO CAPS: 300.0000 mg | ORAL_CAPSULE | Freq: Every morning | ORAL | Status: DC

## 2013-04-02 MED ORDER — PHENYTOIN SODIUM EXTENDED 100 MG PO CAPS
400.0000 mg | ORAL_CAPSULE | Freq: Every day | ORAL | Status: DC
  Filled 2013-04-02: qty 4

## 2013-04-02 MED ORDER — INSULIN ASPART 100 UNIT/ML ~~LOC~~ SOLN: 3.0000 [IU] | Freq: Three times a day (TID) | SUBCUTANEOUS | Status: DC

## 2013-04-02 NOTE — Progress Notes (Signed)
NURSING PROGRESS NOTE  [name] [id number] Discharge Data: 04/02/2013 3:30 PM Attending Provider: Lonia Farber* PCP:No PCP Per Patient     [name] to be D/C'd Skilled nursing facility per MD order.  Discussed with the patient the After Visit Summary and all questions fully answered. All IV's discontinued with no bleeding noted. All belongings returned to patient for patient to take home.   Last Vital Signs:  Blood pressure 138/76, pulse 122, temperature 98 F (36.7 C), temperature source Oral, resp. rate 20, height 5\' 6"  (1.676 m), weight 68 kg (149 lb 14.6 oz), SpO2 100.00%.  Discharge Medication List   Medication List    STOP taking these medications       amantadine 100 MG capsule  Commonly known as:  SYMMETREL     benazepril 10 MG tablet  Commonly known as:  LOTENSIN     magnesium oxide 400 (241.3 MG) MG tablet  Commonly known as:  MAG-OX     metoprolol succinate 25 MG 24 hr tablet  Commonly known as:  TOPROL-XL     Paliperidone Palmitate 234 MG/1.5ML Susp     potassium chloride SA 20 MEQ tablet  Commonly known as:  K-DUR,KLOR-CON     zolpidem 5 MG tablet  Commonly known as:  AMBIEN      TAKE these medications       acetaminophen 500 MG tablet  Commonly known as:  TYLENOL  Take 500 mg by mouth every 6 (six) hours as needed for pain.     aspirin EC 81 MG tablet  Take 81 mg by mouth daily.     B-complex with vitamin C tablet  Take 1 tablet by mouth daily.     calcium carbonate 500 MG chewable tablet  Commonly known as:  TUMS - dosed in mg elemental calcium  Chew 1 tablet by mouth 2 (two) times daily.     docusate sodium 100 MG capsule  Commonly known as:  COLACE  Take 100 mg by mouth 2 (two) times daily.     lamoTRIgine 25 MG tablet  Commonly known as:  LAMICTAL  Take 150 mg by mouth at bedtime.     levETIRAcetam 1000 MG tablet  Commonly known as:  KEPPRA  Take 1 tablet (1,000 mg total) by mouth 2 (two) times daily.     metoprolol tartrate 12.5 mg Tabs tablet  Commonly known as:  LOPRESSOR  Take 0.5 tablets (12.5 mg total) by mouth 2 (two) times daily.     insulin aspart 100 UNIT/ML injection  Commonly known as:  novoLOG  Inject 3 Units into the skin 3 (three) times daily with meals.     NOVOLOG FLEXPEN 100 UNIT/ML Sopn FlexPen  Generic drug:  insulin aspart  Inject 0-8 Units into the skin 3 (three) times daily with meals. Per blood sugar level sliding scale     phenytoin 300 MG ER capsule  Commonly known as:  DILANTIN  Take 1 capsule (300 mg total) by mouth every morning.     phenytoin 200 MG ER capsule  Commonly known as:  DILANTIN  Take 2 capsules (400 mg total) by mouth at bedtime.     QUEtiapine 200 MG tablet  Commonly known as:  SEROQUEL  Take 200 mg by mouth at bedtime.     thiamine 100 MG tablet  Commonly known as:  VITAMIN B-1  Take 100 mg by mouth daily.

## 2013-04-02 NOTE — Progress Notes (Signed)
NEURO HOSPITALIST PROGRESS NOTE   SUBJECTIVE:                                                                                                                        Patient is awake, would not let me get close to her .   OBJECTIVE:                                                                                                                           Vital signs in last 24 hours: Temp:  [98.3 F (36.8 C)-99.6 F (37.6 C)] 98.6 F (37 C) (08/04 0458) Pulse Rate:  [118-120] 119 (08/04 0458) Resp:  [18-20] 20 (08/04 0458) BP: (119-138)/(79-85) 138/82 mmHg (08/04 0458) SpO2:  [98 %-100 %] 98 % (08/04 0458) Weight:  [68 kg (149 lb 14.6 oz)] 68 kg (149 lb 14.6 oz) (08/04 0458)  Intake/Output from previous day: 08/03 0701 - 08/04 0700 In: 895 [P.O.:600; I.V.:295] Out: -  Intake/Output this shift: Total I/O In: 240 [P.O.:240] Out: -  Nutritional status: Carb Control  Past Medical History  Diagnosis Date  . CHF (congestive heart failure)   . Seizures   . Schizophrenia   . Diabetes mellitus without complication   . Hypertension   . Asthma   . Hepatitis A   . Hepatitis B   . Hepatitis C   . Respiratory failure   . Smoker     1 pack daily     Neurologic Exam:  Mental Status: Alert and awake.  Son is in the room.  Patient could not tell me where she was or what facility she was in prior--per son this is baseline. Speech fluent without evidence of aphasia.  Able to follow 3 step commands without difficulty. Cranial Nerves: II: Visual fields grossly normal, pupils equal, round, reactive to light and accommodation III,IV, VI: ptosis not present, extra-ocular motions intact bilaterally V,VII: smile symmetric, facial light touch sensation normal bilaterally VIII: hearing normal bilaterally IX,X: gag reflex present XI: bilateral shoulder shrug XII: midline tongue extension Motor: Lifting all extremities antigravity Tone and bulk:normal  tone throughout; no atrophy noted Sensory: unable to assess as patient would not let me touch her Deep Tendon Reflexes:  Right: Upper Extremity  Left: Upper extremity   Unable to assess as patient would not let me touch her  Plantars: Right: downgoing   Left: downgoing Cerebellar: normal finger-to-nose,     Lab Results: No results found for this basename: cbc, bmp, coags, chol, tri, ldl, hga1c   Lipid Panel No results found for this basename: CHOL, TRIG, HDL, CHOLHDL, VLDL, LDLCALC,  in the last 72 hours  Studies/Results: No results found.  MEDICATIONS                                                                                                                        Scheduled: . antiseptic oral rinse  15 mL Mouth Rinse BID  . aspirin  81 mg Oral Daily  . enoxaparin (LOVENOX) injection  40 mg Subcutaneous Q24H  . insulin aspart  0-9 Units Subcutaneous TID WC  . insulin aspart  3 Units Subcutaneous TID WC  . lamoTRIgine  150 mg Oral QHS  . levETIRAcetam  1,000 mg Oral BID  . metoprolol tartrate  12.5 mg Oral BID  . nicotine  21 mg Transdermal Daily  . QUEtiapine  200 mg Oral QHS    ASSESSMENT/PLAN:                                                                                                             No further seizures.  Corrected Dilantin today is 18.5 (dilantin 12.2 with albumin 2.8).   Recommend: 1) Restart Dilantin at 300 mg in the AM and 400 mg in the PM PO 2) Patient will need dilantin levels as a out patient.  3) will order Dilantin level for AM.     Assessment and plan discussed with with attending physician and they are in agreement.    Felicie Morn PA-C [facility] [phone]  04/02/2013, 10:44 AM

## 2013-04-02 NOTE — Clinical Social Work Placement (Signed)
Clinical Social Work Department CLINICAL SOCIAL WORK PLACEMENT NOTE 04/02/2013  Patient:  [name],[name]  Account Number:  [id number] Admit date:  03/28/2013  Clinical Social Worker:  Lavell Luster  Date/time:  04/02/2013 07:13 PM  Clinical Social Work is seeking post-discharge placement for this patient at the following level of care:   SKILLED NURSING   (*CSW will update this form in Epic as items are completed)   03/31/2013  Patient/family provided with [facility] Department of Clinical Social Work's list of facilities offering this level of care within the geographic area requested by the patient (or if unable, by the patient's family).  03/31/2013  Patient/family informed of their freedom to choose among providers that offer the needed level of care, that participate in Medicare, Medicaid or managed care program needed by the patient, have an available bed and are willing to accept the patient.  03/31/2013  Patient/family informed of [facility]' ownership interest in [facility], as well as of the fact that they are under no obligation to receive care at this facility.  PASARR submitted to EDS on 03/31/2013 PASARR number received from EDS on 03/31/2013  FL2 transmitted to all facilities in geographic area requested by pt/family on  03/31/2013 FL2 transmitted to all facilities within larger geographic area on   Patient informed that his/her managed care company has contracts with or will negotiate with  certain facilities, including the following:     Patient/family informed of bed offers received:  03/31/2013 Patient chooses bed at [facility] Physician recommends and patient chooses bed at    Patient to be transferred to [facility] on  04/02/2013 Patient to be transferred to facility by Ambulance  The following physician request were entered in Epic:   Additional Comments: CSW received handoff for patient to be  DC to [facility]. Per MD patient is being DC to [facility] 04/02/13.   Roddie Mc, [place], [place], [phone]

## 2013-04-02 NOTE — Evaluation (Signed)
Occupational Therapy Evaluation Patient Details Name: [name] MRN: [id number] DOB: July 31, 1946 Today's Date: 04/02/2013 Time: 1130-1205 OT Time Calculation (min): 35 min  OT Assessment / Plan / Recommendation History of present illness Patient ia a 67 yo female admitted from [facility] with seizures.  Was intubated in ER. Patient extubated 03/29/13.   Clinical Impression   Pt seen for diagnosis listed above with deficits listed below.  Pt most limited by decreased balance and fall risk during adls in standing.  Pt would benefit from cont OT to increase I and safety with basic adls on her feet so she can eventually d/c back to an ALF being fairly independent with basic self care.    OT Assessment  Patient needs continued OT Services    Follow Up Recommendations  SNF    Barriers to Discharge Decreased caregiver support came from SNF and son looking for new ALF to go to after SNF.  Equipment Recommendations  None recommended by OT    Recommendations for Other Services    Frequency  Min 2X/week    Precautions / Restrictions Precautions Precautions: Fall Restrictions Weight Bearing Restrictions: No   Pertinent Vitals/Pain Pt with no c/o pain.  Vitals stable.    ADL  Eating/Feeding: Performed;Set up Where Assessed - Eating/Feeding: Chair Grooming: Performed;Teeth care;Wash/dry face;Wash/dry hands;Min guard Where Assessed - Grooming: Supported standing Upper Body Bathing: Simulated;Set up Where Assessed - Upper Body Bathing: Unsupported sitting Lower Body Bathing: Simulated;Minimal assistance Where Assessed - Lower Body Bathing: Supported sit to stand Upper Body Dressing: Simulated;Set up Where Assessed - Upper Body Dressing: Supported sitting Lower Body Dressing: Simulated;Minimal assistance Where Assessed - Lower Body Dressing: Supported sit to stand Toilet Transfer: Performed;Min Pension scheme manager Method: Other (comment) (walked to br) Product/process development scientist: Comfort height toilet;Grab bars Toileting - Clothing Manipulation and Hygiene: Performed;Minimal assistance Where Assessed - Engineer, mining and Hygiene: Standing Equipment Used: Rolling walker Transfers/Ambulation Related to ADLs: Pt walked in room with RW to bathroom and sink and into hallway with min guard and cues for safety. ADL Comments: Pt overall does well with adls. pt slightly unsteady when on her feet during adls but overall does well.    OT Diagnosis: Generalized weakness;Cognitive deficits  OT Problem List: Decreased cognition;Decreased safety awareness;Impaired balance (sitting and/or standing);Decreased knowledge of use of DME or AE OT Treatment Interventions: Self-care/ADL training;Therapeutic activities   OT Goals(Current goals can be found in the care plan section) Acute Rehab OT Goals Patient Stated Goal: none stated. OT Goal Formulation: With patient/family Time For Goal Achievement: 04/16/13 Potential to Achieve Goals: Fair ADL Goals Pt Will Perform Grooming: with supervision;standing Pt Will Perform Lower Body Bathing: with supervision;sit to/from stand Pt Will Perform Lower Body Dressing: with supervision;sit to/from stand Pt Will Perform Tub/Shower Transfer: with supervision;ambulating;rolling walker;shower seat Additional ADL Goal #1: Pt will complete all aspects of toileting with S.  Visit Information  Last OT Received On: 04/02/13 Assistance Needed: +1 History of Present Illness: Patient ia a 67 yo female admitted from [facility] with seizures.  Was intubated in ER. Patient extubated 03/29/13.       Prior Functioning     Home Living Family/patient expects to be discharged to:: Skilled nursing facility Additional Comments: Pt to go back to rehab (where she went after first sz event two weeks ago) then son to assist her in finding a new assisted living place for long term. Prior Function Level of  Independence: Needs assistance  ADL's / Homemaking Assistance Needed: Pt/son stated pt could bathe and dress herself prior to first sz event.  needed assist with homemaking tasks and cooking. Comments: Unsure of amount of assist needed at SNF pta.  Patient unreliable at this time. Communication Communication: No difficulties Dominant Hand: Right         Vision/Perception Vision - History Baseline Vision: No visual deficits Patient Visual Report: No change from baseline Vision - Assessment Vision Assessment: Vision not tested   Cognition  Cognition Arousal/Alertness: Awake/alert Behavior During Therapy: WFL for tasks assessed/performed Overall Cognitive Status: History of cognitive impairments - at baseline Area of Impairment: Memory;Safety/judgement;Problem solving Memory: Decreased short-term memory Safety/Judgement: Decreased awareness of safety;Decreased awareness of deficits Problem Solving: Decreased initiation;Difficulty sequencing;Requires verbal cues General Comments: Patient unable to recall where she was pta.  Unable to give PLOF information.    Extremity/Trunk Assessment Upper Extremity Assessment Upper Extremity Assessment: Overall WFL for tasks assessed Lower Extremity Assessment Lower Extremity Assessment: Defer to PT evaluation Cervical / Trunk Assessment Cervical / Trunk Assessment: Normal     Mobility Bed Mobility Bed Mobility: Not assessed Transfers Transfers: Sit to Stand;Stand to Sit Sit to Stand: 4: Min guard;With armrests;From chair/3-in-1 Stand to Sit: 4: Min guard;With armrests;To chair/3-in-1 Details for Transfer Assistance: VCs given for hand placement when sitting and when standing.     Exercise     Balance Balance Balance Assessed: No   End of Session OT - End of Session Equipment Utilized During Treatment: Rolling walker Activity Tolerance: Patient tolerated treatment well Patient left: in chair;with call bell/phone within reach;with  family/visitor present Nurse Communication: Mobility status  GO     Hope Budds 04/02/2013, 12:19 PM [phone]

## 2013-04-02 NOTE — Progress Notes (Signed)
Physical Therapy Treatment Patient Details Name: [name] MRN: [id number] DOB: [birth year] Today's Date: 04/02/2013 Time: 4098-1191 PT Time Calculation (min): 24 min  PT Assessment / Plan / Recommendation  History of Present Illness Patient ia a 67 yo female admitted from [facility] with seizures.  Was intubated in ER. Patient extubated 03/29/13.   PT Comments   Pt making progress with mobility & PT goals.  Increased ambulation distance this session.  Cont to recommend SNF at d/c to maximize independence & safety.     Follow Up Recommendations  SNF     Does the patient have the potential to tolerate intense rehabilitation     Barriers to Discharge        Equipment Recommendations  Rolling walker with 5" wheels    Recommendations for Other Services    Frequency Min 2X/week   Progress towards PT Goals Progress towards PT goals: Progressing toward goals  Plan Current plan remains appropriate    Precautions / Restrictions Precautions Precautions: Fall Restrictions Weight Bearing Restrictions: No   Pertinent Vitals/Pain No pain reported.     Mobility  Bed Mobility Bed Mobility: Not assessed Transfers Transfers: Sit to Stand;Stand to Sit Sit to Stand: 4: Min guard;With upper extremity assist;With armrests;From chair/3-in-1 Stand to Sit: 4: Min guard;With upper extremity assist;With armrests;To chair/3-in-1 Details for Transfer Assistance: Performed 5x's.  Cues for safe hand placement.   Ambulation/Gait Ambulation/Gait Assistance: 4: Min guard Ambulation Distance (Feet): 120 Feet Assistive device: Rolling walker Ambulation/Gait Assistance Details: cues for tall posture, stay closer to RW, & safe use of RW.   Gait Pattern: Step-through pattern;Decreased stride length;Decreased hip/knee flexion - left;Trunk flexed;Shuffle Gait velocity: Slow gait speed Stairs: No Wheelchair Mobility Wheelchair Mobility: No      PT Goals (current goals can now be  found in the care plan section) Acute Rehab PT Goals Patient Stated Goal: none stated. PT Goal Formulation: With patient Time For Goal Achievement: 04/13/13 Potential to Achieve Goals: Good  Visit Information  Last PT Received On: 04/02/13 Assistance Needed: +1 History of Present Illness: Patient ia a 66 yo female admitted from [facility] with seizures.  Was intubated in ER. Patient extubated 03/29/13.    Subjective Data  Subjective: "Good, Im ready to walk" Patient Stated Goal: none stated.   Cognition  Cognition Arousal/Alertness: Awake/alert Behavior During Therapy: WFL for tasks assessed/performed Overall Cognitive Status: History of cognitive impairments - at baseline Area of Impairment: Memory;Safety/judgement;Problem solving Memory: Decreased short-term memory Safety/Judgement: Decreased awareness of safety;Decreased awareness of deficits Problem Solving: Decreased initiation;Difficulty sequencing;Requires verbal cues General Comments: Patient unable to recall where she was pta.  Unable to give PLOF information.    Balance  Balance Balance Assessed: No  End of Session PT - End of Session Equipment Utilized During Treatment: Gait belt Activity Tolerance: Patient tolerated treatment well Patient left: in chair;with call bell/phone within reach Nurse Communication: Mobility status   GP     Lara Mulch 04/02/2013, 2:28 PM   Verdell Face, PTA [phone] 04/02/2013

## 2013-04-02 NOTE — Discharge Summary (Signed)
Physician Discharge Summary  Patient ID: [name] MRN: [id number] DOB/AGE: August 07, 1946 67 y.o.  Admit date: 03/28/2013 Discharge date: 04/02/2013    Discharge Diagnoses:  Active Problems:   Status epilepticus   Acute respiratory failure   DM (diabetes mellitus)   Anemia   Altered mental status    Brief Summary: [name] is a 67 yo with past medical history of schizophrenia and seizure disorder (on Dilantin and Keppra) brought to ED with status epilepticus. Per EMS, patient had been seizing for 25-30 mins prior to arrival. EMS gave 5 mg of Versed with resolution of seizure. Patient was then brought to the ED. In the ED patient was intubated for airway protection. Patient was also given IV Keppra 1000 mg x 1. PCCM was then consulted. One month ago treated in outside hospital for similar presentation.  SIGNIFICANT EVENTS / STUDIES:  7/30 Head CT >>> NAD  7/30 Additional seizure activity; Given Ativan and Dilantin load  7/30 EEG - This is an abnormal EEG secondary to hemispheric asymmetry and right temporal sharp transients. This is consistent with the patient's history of seizures. No evidence of nonconvulsive seizure activity is noted.  7/31 patient extubated   LINES / TUBES:  OETT 7/30 >>>7/31  OGT 7/30 >>>7/31  Foley 7/30 >>>8/1   CULTURES:  7/30 Blood >>>  7/30 Urine >>>ng                                                                    Hospital Summary by Discharge Diagnosis  Status epilepticus. H/o seizure disorder, schizophrenia.  - followed closely by neurology.  No further seizure activity.  EEG as above. Cont keppra and dilantin as below.  Dilantin level elevated and dilantin held during admit.  Now back to normal and dilantin resumed per neuro recs.  Will need dilantin level 8/5. Cont previous lamictal as well.   Acute respiratory failure in setting of status epilepticus - resolved.   Hx HTN - cont hold anti-HTN at d/c as BP wnl.  Cont monitor at SNF.    DM - well controlled on SSI .   Filed Vitals:   04/01/13 0642 04/01/13 1420 04/01/13 2144 04/02/13 0458  BP:  119/79 137/85 138/82  Pulse:  118 120 119  Temp:  99.6 F (37.6 C) 98.3 F (36.8 C) 98.6 F (37 C)  TempSrc:  Oral Oral Oral  Resp:  20 18 20   Height:      Weight: 150 lb 9.2 oz (68.3 kg)   149 lb 14.6 oz (68 kg)  SpO2:  100% 100% 98%     Discharge Labs  BMET  Recent Labs Lab 03/28/13 0327 03/28/13 0445 03/28/13 1425 03/29/13 0555 03/30/13 0415 03/31/13 0645  NA 134*  --  136 141 140 139  K 4.2  --  4.4 3.4* 4.1 4.2  CL 99  --  103 109 107 103  CO2 22  --  20 23 22 21   GLUCOSE 265*  --  101* 130* 109* 131*  BUN 11  --  7 7 8 7   CREATININE 0.48*  --  0.42* 0.43* 0.42* 0.38*  CALCIUM 9.4  --  9.1 8.9 9.0 9.5  MG  --  1.7  --   --   --  1.6     CBC   Recent Labs Lab 03/28/13 1425 03/29/13 0555 04/01/13 0859  HGB 10.5* 9.4* 11.1*  HCT 31.7* 28.6* 32.8*  WBC 9.7 7.6 4.8  PLT 200 184 300   Anti-Coagulation No results found for this basename: INR,  in the last 168 hours        Medication List    STOP taking these medications       amantadine 100 MG capsule  Commonly known as:  SYMMETREL     benazepril 10 MG tablet  Commonly known as:  LOTENSIN     magnesium oxide 400 (241.3 MG) MG tablet  Commonly known as:  MAG-OX     metoprolol succinate 25 MG 24 hr tablet  Commonly known as:  TOPROL-XL     Paliperidone Palmitate 234 MG/1.5ML Susp     potassium chloride SA 20 MEQ tablet  Commonly known as:  K-DUR,KLOR-CON     zolpidem 5 MG tablet  Commonly known as:  AMBIEN      TAKE these medications       acetaminophen 500 MG tablet  Commonly known as:  TYLENOL  Take 500 mg by mouth every 6 (six) hours as needed for pain.     aspirin EC 81 MG tablet  Take 81 mg by mouth daily.     B-complex with vitamin C tablet  Take 1 tablet by mouth daily.     calcium carbonate 500 MG chewable tablet  Commonly known as:  TUMS - dosed in  mg elemental calcium  Chew 1 tablet by mouth 2 (two) times daily.     docusate sodium 100 MG capsule  Commonly known as:  COLACE  Take 100 mg by mouth 2 (two) times daily.     lamoTRIgine 25 MG tablet  Commonly known as:  LAMICTAL  Take 150 mg by mouth at bedtime.     levETIRAcetam 1000 MG tablet  Commonly known as:  KEPPRA  Take 1 tablet (1,000 mg total) by mouth 2 (two) times daily.     metoprolol tartrate 12.5 mg Tabs tablet  Commonly known as:  LOPRESSOR  Take 0.5 tablets (12.5 mg total) by mouth 2 (two) times daily.     insulin aspart 100 UNIT/ML injection  Commonly known as:  novoLOG  Inject 3 Units into the skin 3 (three) times daily with meals.     NOVOLOG FLEXPEN 100 UNIT/ML Sopn FlexPen  Generic drug:  insulin aspart  Inject 0-8 Units into the skin 3 (three) times daily with meals. Per blood sugar level sliding scale     phenytoin 300 MG ER capsule  Commonly known as:  DILANTIN  Take 1 capsule (300 mg total) by mouth every morning.     phenytoin 200 MG ER capsule  Commonly known as:  DILANTIN  Take 2 capsules (400 mg total) by mouth at bedtime.     QUEtiapine 200 MG tablet  Commonly known as:  SEROQUEL  Take 200 mg by mouth at bedtime.     thiamine 100 MG tablet  Commonly known as:  VITAMIN B-1  Take 100 mg by mouth daily.          Disposition:  SNF   Discharged Condition: [name] has met maximum benefit of inpatient care and is medically stable and cleared for discharge.  Patient is pending follow up as above.      Time spent on disposition:  Greater than 35 minutes.   SignedDanford Bad, NP 04/02/2013  11:52 AM Pager: [phone] or [phone]  *Care during the described time interval was provided by me and/or other providers on the critical care team. I have reviewed this patient's available data, including medical history, events of note, physical examination and test results as part of my evaluation.   Adalia Pettis  V.

## 2013-04-02 NOTE — Progress Notes (Signed)
Spoke to son & pt. OK to dc today -back to [facility], on dilantin &  keppra per neuro Resumed seroquel & lamictal.  [name] Demeo V.

## 2013-04-03 LAB — CULTURE, BLOOD (ROUTINE X 2): Culture: NO GROWTH

## 2013-05-10 ENCOUNTER — Emergency Department: Payer: Self-pay | Admitting: Emergency Medicine

## 2013-05-10 LAB — URINALYSIS, COMPLETE
Bilirubin,UR: NEGATIVE
Blood: NEGATIVE
Glucose,UR: NEGATIVE mg/dL (ref 0–75)
Nitrite: NEGATIVE
RBC,UR: 1 /HPF (ref 0–5)
Specific Gravity: 1.012 (ref 1.003–1.030)
WBC UR: 2 /HPF (ref 0–5)

## 2013-05-10 LAB — CBC WITH DIFFERENTIAL/PLATELET
Basophil #: 0 10*3/uL (ref 0.0–0.1)
Basophil %: 0.6 %
Monocyte %: 15.1 %
Neutrophil #: 2.8 10*3/uL (ref 1.4–6.5)
Neutrophil %: 45.3 %
Platelet: 289 10*3/uL (ref 150–440)
RBC: 3.22 10*6/uL — ABNORMAL LOW (ref 3.80–5.20)
RDW: 14.9 % — ABNORMAL HIGH (ref 11.5–14.5)
WBC: 6.2 10*3/uL (ref 3.6–11.0)

## 2013-05-10 LAB — BASIC METABOLIC PANEL
Anion Gap: 7 (ref 7–16)
BUN: 12 mg/dL (ref 7–18)
Chloride: 106 mmol/L (ref 98–107)
Co2: 27 mmol/L (ref 21–32)
EGFR (Non-African Amer.): >60
Glucose: 113 mg/dL — ABNORMAL HIGH (ref 65–99)
Potassium: 3.7 mmol/L (ref 3.5–5.1)

## 2013-05-10 LAB — CK TOTAL AND CKMB (NOT AT ARMC)
CK, Total: 66 U/L (ref 21–215)
CK-MB: 1.6 ng/mL (ref 0.5–3.6)

## 2013-05-10 LAB — PROTIME-INR
INR: 1
Prothrombin Time: 13.3 secs (ref 11.5–14.7)

## 2013-05-10 LAB — APTT: Activated PTT: 37.1 secs — ABNORMAL HIGH (ref 23.6–35.9)

## 2013-07-05 ENCOUNTER — Emergency Department (HOSPITAL_COMMUNITY): Payer: Medicare Other

## 2013-07-05 ENCOUNTER — Encounter (HOSPITAL_COMMUNITY): Payer: Self-pay | Admitting: Emergency Medicine

## 2013-07-05 ENCOUNTER — Inpatient Hospital Stay (HOSPITAL_COMMUNITY)
Admission: EM | Admit: 2013-07-05 | Discharge: 2013-07-16 | DRG: 100 | Disposition: A | Discharge status: Skilled Nursing Facility | Payer: Medicare Other | Attending: Family Medicine | Admitting: Family Medicine

## 2013-07-05 DIAGNOSIS — R4182 Altered mental status, unspecified: Secondary | ICD-10-CM | POA: Diagnosis present

## 2013-07-05 DIAGNOSIS — I10 Essential (primary) hypertension: Secondary | ICD-10-CM | POA: Diagnosis present

## 2013-07-05 DIAGNOSIS — G40401 Other generalized epilepsy and epileptic syndromes, not intractable, with status epilepticus: Principal | ICD-10-CM | POA: Diagnosis present

## 2013-07-05 DIAGNOSIS — G9349 Other encephalopathy: Secondary | ICD-10-CM | POA: Diagnosis present

## 2013-07-05 DIAGNOSIS — Z794 Long term (current) use of insulin: Secondary | ICD-10-CM

## 2013-07-05 DIAGNOSIS — N390 Urinary tract infection, site not specified: Secondary | ICD-10-CM | POA: Diagnosis present

## 2013-07-05 DIAGNOSIS — A419 Sepsis, unspecified organism: Secondary | ICD-10-CM | POA: Diagnosis not present

## 2013-07-05 DIAGNOSIS — T82898A Other specified complication of vascular prosthetic devices, implants and grafts, initial encounter: Secondary | ICD-10-CM | POA: Diagnosis not present

## 2013-07-05 DIAGNOSIS — E876 Hypokalemia: Secondary | ICD-10-CM | POA: Diagnosis present

## 2013-07-05 DIAGNOSIS — J9600 Acute respiratory failure, unspecified whether with hypoxia or hypercapnia: Secondary | ICD-10-CM | POA: Diagnosis not present

## 2013-07-05 DIAGNOSIS — F209 Schizophrenia, unspecified: Secondary | ICD-10-CM

## 2013-07-05 DIAGNOSIS — Z23 Encounter for immunization: Secondary | ICD-10-CM

## 2013-07-05 DIAGNOSIS — I82629 Acute embolism and thrombosis of deep veins of unspecified upper extremity: Secondary | ICD-10-CM | POA: Diagnosis not present

## 2013-07-05 DIAGNOSIS — G40309 Generalized idiopathic epilepsy and epileptic syndromes, not intractable, without status epilepticus: Secondary | ICD-10-CM | POA: Diagnosis present

## 2013-07-05 DIAGNOSIS — Z880 Allergy status to penicillin: Secondary | ICD-10-CM

## 2013-07-05 DIAGNOSIS — R0681 Apnea, not elsewhere classified: Secondary | ICD-10-CM | POA: Diagnosis present

## 2013-07-05 DIAGNOSIS — B1910 Unspecified viral hepatitis B without hepatic coma: Secondary | ICD-10-CM | POA: Diagnosis present

## 2013-07-05 DIAGNOSIS — N12 Tubulo-interstitial nephritis, not specified as acute or chronic: Secondary | ICD-10-CM | POA: Diagnosis present

## 2013-07-05 DIAGNOSIS — G40901 Epilepsy, unspecified, not intractable, with status epilepticus: Secondary | ICD-10-CM

## 2013-07-05 DIAGNOSIS — E119 Type 2 diabetes mellitus without complications: Secondary | ICD-10-CM | POA: Diagnosis present

## 2013-07-05 DIAGNOSIS — D649 Anemia, unspecified: Secondary | ICD-10-CM | POA: Diagnosis present

## 2013-07-05 DIAGNOSIS — Z888 Allergy status to other drugs, medicaments and biological substances status: Secondary | ICD-10-CM

## 2013-07-05 DIAGNOSIS — B159 Hepatitis A without hepatic coma: Secondary | ICD-10-CM | POA: Diagnosis present

## 2013-07-05 DIAGNOSIS — Y849 Medical procedure, unspecified as the cause of abnormal reaction of the patient, or of later complication, without mention of misadventure at the time of the procedure: Secondary | ICD-10-CM | POA: Diagnosis not present

## 2013-07-05 DIAGNOSIS — B1920 Unspecified viral hepatitis C without hepatic coma: Secondary | ICD-10-CM | POA: Diagnosis present

## 2013-07-05 DIAGNOSIS — G40909 Epilepsy, unspecified, not intractable, without status epilepticus: Secondary | ICD-10-CM | POA: Diagnosis present

## 2013-07-05 DIAGNOSIS — F17200 Nicotine dependence, unspecified, uncomplicated: Secondary | ICD-10-CM | POA: Diagnosis present

## 2013-07-05 DIAGNOSIS — R0682 Tachypnea, not elsewhere classified: Secondary | ICD-10-CM | POA: Diagnosis present

## 2013-07-05 DIAGNOSIS — Z7982 Long term (current) use of aspirin: Secondary | ICD-10-CM

## 2013-07-05 DIAGNOSIS — Y9210 Unspecified residential institution as the place of occurrence of the external cause: Secondary | ICD-10-CM | POA: Diagnosis not present

## 2013-07-05 DIAGNOSIS — I509 Heart failure, unspecified: Secondary | ICD-10-CM | POA: Diagnosis present

## 2013-07-05 DIAGNOSIS — IMO0002 Reserved for concepts with insufficient information to code with codable children: Secondary | ICD-10-CM | POA: Diagnosis not present

## 2013-07-05 DIAGNOSIS — J45909 Unspecified asthma, uncomplicated: Secondary | ICD-10-CM | POA: Diagnosis present

## 2013-07-05 DIAGNOSIS — Z7901 Long term (current) use of anticoagulants: Secondary | ICD-10-CM

## 2013-07-05 DIAGNOSIS — R000 Tachycardia, unspecified: Secondary | ICD-10-CM | POA: Diagnosis not present

## 2013-07-05 DIAGNOSIS — R569 Unspecified convulsions: Secondary | ICD-10-CM

## 2013-07-05 DIAGNOSIS — Z79899 Other long term (current) drug therapy: Secondary | ICD-10-CM

## 2013-07-05 DIAGNOSIS — I959 Hypotension, unspecified: Secondary | ICD-10-CM | POA: Diagnosis present

## 2013-07-05 DIAGNOSIS — E871 Hypo-osmolality and hyponatremia: Secondary | ICD-10-CM | POA: Diagnosis present

## 2013-07-05 DIAGNOSIS — Z91013 Allergy to seafood: Secondary | ICD-10-CM

## 2013-07-05 LAB — RAPID URINE DRUG SCREEN, HOSP PERFORMED
Amphetamines: NOT DETECTED
Barbiturates: NOT DETECTED
Benzodiazepines: POSITIVE — AB

## 2013-07-05 LAB — URINALYSIS, ROUTINE W REFLEX MICROSCOPIC
Bilirubin Urine: NEGATIVE
Ketones, ur: 40 mg/dL — AB
Nitrite: NEGATIVE
Protein, ur: NEGATIVE mg/dL
pH: 7.5 (ref 5.0–8.0)

## 2013-07-05 LAB — CBC WITH DIFFERENTIAL/PLATELET
Basophils Relative: 0 % (ref 0–1)
HCT: 39 % (ref 36.0–46.0)
Hemoglobin: 12.9 g/dL (ref 12.0–15.0)
Lymphocytes Relative: 30 % (ref 12–46)
Lymphs Abs: 1.4 10*3/uL (ref 0.7–4.0)
MCHC: 33.1 g/dL (ref 30.0–36.0)
Monocytes Absolute: 0.4 10*3/uL (ref 0.1–1.0)
Monocytes Relative: 9 % (ref 3–12)
Neutro Abs: 2.9 10*3/uL (ref 1.7–7.7)
Neutrophils Relative %: 61 % (ref 43–77)
RBC: 4.19 MIL/uL (ref 3.87–5.11)
WBC: 4.8 10*3/uL (ref 4.0–10.5)

## 2013-07-05 LAB — COMPREHENSIVE METABOLIC PANEL
Albumin: 4 g/dL (ref 3.5–5.2)
Alkaline Phosphatase: 169 U/L — ABNORMAL HIGH (ref 39–117)
BUN: 12 mg/dL (ref 6–23)
CO2: 25 mEq/L (ref 19–32)
Chloride: 101 mEq/L (ref 96–112)
Creatinine, Ser: 0.44 mg/dL — ABNORMAL LOW (ref 0.50–1.10)
GFR calc non Af Amer: >90 mL/min (ref >90)
Glucose, Bld: 108 mg/dL — ABNORMAL HIGH (ref 70–99)
Potassium: 4.5 mEq/L (ref 3.5–5.1)
Total Bilirubin: 0.2 mg/dL — ABNORMAL LOW (ref 0.3–1.2)

## 2013-07-05 LAB — GLUCOSE, CAPILLARY

## 2013-07-05 LAB — URINE MICROSCOPIC-ADD ON

## 2013-07-05 LAB — TROPONIN I: Troponin I: <0.3 ng/mL (ref <0.30)

## 2013-07-05 LAB — CG4 I-STAT (LACTIC ACID): Lactic Acid, Venous: 1.9 mmol/L (ref 0.5–2.2)

## 2013-07-05 MED ORDER — ASPIRIN EC 81 MG PO TBEC
81.0000 mg | DELAYED_RELEASE_TABLET | Freq: Every day | ORAL | Status: DC
  Administered 2013-07-05 – 2013-07-06 (×2): 81 mg via ORAL
  Filled 2013-07-05 (×5): qty 1

## 2013-07-05 MED ORDER — METOPROLOL TARTRATE 12.5 MG HALF TABLET
12.5000 mg | ORAL_TABLET | Freq: Once | ORAL | Status: AC
  Administered 2013-07-05: 12.5 mg via ORAL
  Filled 2013-07-05 (×2): qty 1

## 2013-07-05 MED ORDER — SODIUM CHLORIDE 0.9 % IV BOLUS (SEPSIS)
1000.0000 mL | Freq: Once | INTRAVENOUS | Status: AC
  Administered 2013-07-05: 1000 mL via INTRAVENOUS

## 2013-07-05 MED ORDER — METOPROLOL TARTRATE 25 MG PO TABS
25.0000 mg | ORAL_TABLET | Freq: Two times a day (BID) | ORAL | Status: DC
  Administered 2013-07-06: 25 mg via ORAL
  Filled 2013-07-05 (×6): qty 1

## 2013-07-05 MED ORDER — METOPROLOL TARTRATE 12.5 MG HALF TABLET
12.5000 mg | ORAL_TABLET | Freq: Two times a day (BID) | ORAL | Status: DC
  Administered 2013-07-05: 12.5 mg via ORAL
  Filled 2013-07-05: qty 1

## 2013-07-05 MED ORDER — CLONAZEPAM 0.5 MG PO TABS
0.5000 mg | ORAL_TABLET | Freq: Two times a day (BID) | ORAL | Status: DC | PRN
  Administered 2013-07-05 – 2013-07-13 (×2): 0.5 mg via ORAL
  Filled 2013-07-05 (×3): qty 1

## 2013-07-05 MED ORDER — SODIUM CHLORIDE 0.9 % IV SOLN
750.0000 mg | Freq: Two times a day (BID) | INTRAVENOUS | Status: DC
  Administered 2013-07-06: 750 mg via INTRAVENOUS
  Filled 2013-07-05 (×2): qty 7.5

## 2013-07-05 MED ORDER — LAMOTRIGINE 150 MG PO TABS
150.0000 mg | ORAL_TABLET | Freq: Every day | ORAL | Status: DC
  Administered 2013-07-05 – 2013-07-15 (×8): 150 mg via ORAL
  Filled 2013-07-05 (×13): qty 1

## 2013-07-05 MED ORDER — VITAMIN B-1 100 MG PO TABS
100.0000 mg | ORAL_TABLET | Freq: Every day | ORAL | Status: DC
  Administered 2013-07-05 – 2013-07-16 (×9): 100 mg via ORAL
  Filled 2013-07-05 (×12): qty 1

## 2013-07-05 MED ORDER — ACETAMINOPHEN 650 MG RE SUPP
650.0000 mg | Freq: Four times a day (QID) | RECTAL | Status: DC | PRN
  Administered 2013-07-07 – 2013-07-08 (×5): 650 mg via RECTAL
  Filled 2013-07-05 (×5): qty 1

## 2013-07-05 MED ORDER — B COMPLEX-C PO TABS
1.0000 | ORAL_TABLET | Freq: Every day | ORAL | Status: DC
  Administered 2013-07-05 – 2013-07-16 (×9): 1 via ORAL
  Filled 2013-07-05 (×12): qty 1

## 2013-07-05 MED ORDER — CALCIUM CARBONATE ANTACID 500 MG PO CHEW
1.0000 | CHEWABLE_TABLET | Freq: Two times a day (BID) | ORAL | Status: DC
  Administered 2013-07-05 – 2013-07-16 (×17): 200 mg via ORAL
  Filled 2013-07-05 (×23): qty 1

## 2013-07-05 MED ORDER — SODIUM CHLORIDE 0.9 % IV SOLN
INTRAVENOUS | Status: DC
  Administered 2013-07-05: 22:00:00 via INTRAVENOUS

## 2013-07-05 MED ORDER — MAGNESIUM OXIDE 400 (241.3 MG) MG PO TABS
400.0000 mg | ORAL_TABLET | Freq: Every day | ORAL | Status: DC
  Administered 2013-07-05 – 2013-07-10 (×4): 400 mg via ORAL
  Filled 2013-07-05 (×7): qty 1

## 2013-07-05 MED ORDER — SODIUM CHLORIDE 0.9 % IV SOLN
1000.0000 mg | INTRAVENOUS | Status: AC
  Administered 2013-07-05: 1000 mg via INTRAVENOUS
  Filled 2013-07-05: qty 10

## 2013-07-05 MED ORDER — DOCUSATE SODIUM 100 MG PO CAPS
100.0000 mg | ORAL_CAPSULE | Freq: Two times a day (BID) | ORAL | Status: DC
  Administered 2013-07-05 – 2013-07-09 (×3): 100 mg via ORAL
  Filled 2013-07-05 (×11): qty 1

## 2013-07-05 MED ORDER — SODIUM CHLORIDE 0.9 % IJ SOLN
3.0000 mL | Freq: Two times a day (BID) | INTRAMUSCULAR | Status: DC
  Administered 2013-07-05 – 2013-07-13 (×12): 3 mL via INTRAVENOUS

## 2013-07-05 MED ORDER — ACETAMINOPHEN 325 MG PO TABS
650.0000 mg | ORAL_TABLET | Freq: Four times a day (QID) | ORAL | Status: DC | PRN
  Administered 2013-07-05: 650 mg via ORAL
  Filled 2013-07-05: qty 2

## 2013-07-05 NOTE — ED Provider Notes (Signed)
Medical screening examination/treatment/procedure(s) were conducted as a shared visit with resident-physician practitioner(s) and myself.  I personally evaluated the patient during the encounter.  4:32 PM Pt is a 67 y.o. female with pmhx as above presenting with status epilepticus.  Pt having seizure activity upon arrival w/  R sided gaze, stiffness, unresponsiveness.  Seizure resolved prior to receiving benzos.  She is now near baseline, but confused.  Neurology has seen pt, believe she needs inpt medical admit for medication mgmt as pt recently dilantin toxic and was taken off this med.  [facility] will admit.    Shanna Cisco, MD 07/05/13 [phone]

## 2013-07-05 NOTE — ED Notes (Signed)
Patient transported to CT 

## 2013-07-05 NOTE — ED Notes (Signed)
Assisted Johnston Ebbs, RN with situating pt on stretcher

## 2013-07-05 NOTE — ED Notes (Signed)
Called [facility] for safety sitter, will not have one available till 67.

## 2013-07-05 NOTE — ED Notes (Signed)
Lactic acid results shown to Dr. Docherty 

## 2013-07-05 NOTE — ED Provider Notes (Signed)
CSN: [id number]     Arrival date & time 07/05/13  1249 History   First MD Initiated Contact with Patient 07/05/13 1304     Chief Complaint  Patient presents with  . Altered Mental Status   (Consider location/radiation/quality/duration/timing/severity/associated sxs/prior Treatment) HPI History limited by patient condition of altered mental status.   Pt here with AMS, seizure like activity from facility via EMS. Last seen normal at 9 AM. At baseline patient able to walk and talk with frequent disruptive behavior. She was found at 1130 AM unresponsive, EMS called. EMS arrived and patient was unresponsive with tremors, right side gaze. They gave 2 mg IV versed which stopped seizure momentarily but it then returned so gave another dose.    Past Medical History  Diagnosis Date  . CHF (congestive heart failure)   . Seizures   . Schizophrenia   . Diabetes mellitus without complication   . Hypertension   . Asthma   . Hepatitis A   . Hepatitis B   . Hepatitis C   . Respiratory failure   . Smoker     1 pack daily   No past surgical history on file. No family history on file. History  Substance Use Topics  . Smoking status: Current Every Day Smoker -- 1.00 packs/day for 50 years    Types: Cigarettes  . Smokeless tobacco: Not on file  . Alcohol Use: Not on file   OB History   Grav Para Term Preterm Abortions TAB SAB Ect Mult Living                 Review of Systems History limited by patient condition of altered mental status.  Allergies  Depakote; Haldol; Penicillins; Shellfish allergy; and Trileptal  Home Medications   Current Outpatient Rx  Name  Route  Sig  Dispense  Refill  . acetaminophen (TYLENOL) 500 MG tablet   Oral   Take 500 mg by mouth every 6 (six) hours as needed for pain.         [name] aspirin EC 81 MG tablet   Oral   Take 81 mg by mouth daily.         . B Complex-C (B-COMPLEX WITH VITAMIN C) tablet   Oral   Take 1 tablet by mouth daily.         .  calcium carbonate (TUMS - DOSED IN MG ELEMENTAL CALCIUM) 500 MG chewable tablet   Oral   Chew 1 tablet by mouth 2 (two) times daily.         [name] docusate sodium (COLACE) 100 MG capsule   Oral   Take 100 mg by mouth 2 (two) times daily.         . insulin aspart (NOVOLOG FLEXPEN) 100 UNIT/ML SOPN FlexPen   Subcutaneous   Inject 0-8 Units into the skin 3 (three) times daily with meals. Per blood sugar level sliding scale         . insulin aspart (NOVOLOG) 100 UNIT/ML injection   Subcutaneous   Inject 3 Units into the skin 3 (three) times daily with meals.   1 vial   12   . lamoTRIgine (LAMICTAL) 25 MG tablet   Oral   Take 150 mg by mouth at bedtime.         . levETIRAcetam (KEPPRA) 1000 MG tablet   Oral   Take 1 tablet (1,000 mg total) by mouth 2 (two) times daily.         . phenytoin (DILANTIN)  200 MG ER capsule   Oral   Take 2 capsules (400 mg total) by mouth at bedtime.         . phenytoin (DILANTIN) 300 MG ER capsule   Oral   Take 1 capsule (300 mg total) by mouth every morning.         [name] QUEtiapine (SEROQUEL) 200 MG tablet   Oral   Take 200 mg by mouth at bedtime.         . thiamine (VITAMIN B-1) 100 MG tablet   Oral   Take 100 mg by mouth daily.          Temp(Src) 99.8 F (37.7 C) (Rectal)  SpO2 88% Physical Exam Nursing note and vitals reviewed.  Constitutional: Pt is unresponsive with twitching movements of upper extremities.  Eyes: PERRL, rightward gaze. No injection, no scleral icterus. HENT: Atraumatic, normal nose, ears, airway open without erythema or exudate.  Respiratory: No respiratory distress. Equal breathing bilaterally. Cardiovascular: Tachycardic rate. Extremities warm and well perfused.  Abdomen: Soft, non-tender. MSK: Extremities are atraumatic without deformity. Skin: No rash, no wounds.   Neuro: GCS 3. Seizure activity, increased tone.      ED Course  Procedures (including critical care time) Labs Review Labs  Reviewed  COMPREHENSIVE METABOLIC PANEL - Abnormal; Notable for the following:    Glucose, Bld 108 (*)    Creatinine, Ser 0.44 (*)    Alkaline Phosphatase 169 (*)    Total Bilirubin 0.2 (*)    All other components within normal limits  URINALYSIS, ROUTINE W REFLEX MICROSCOPIC - Abnormal; Notable for the following:    Hgb urine dipstick TRACE (*)    Ketones, ur 40 (*)    All other components within normal limits  URINE RAPID DRUG SCREEN (HOSP PERFORMED) - Abnormal; Notable for the following:    Benzodiazepines POSITIVE (*)    All other components within normal limits  URINE MICROSCOPIC-ADD ON - Abnormal; Notable for the following:    Bacteria, UA FEW (*)    All other components within normal limits  GLUCOSE, CAPILLARY  CBC WITH DIFFERENTIAL  TROPONIN I  PHENYTOIN LEVEL, FREE  CG4 I-STAT (LACTIC ACID)   Imaging Review Dg Chest 1 View  07/05/2013   CLINICAL DATA:  Altered mental status.  EXAM: CHEST - 1 VIEW  COMPARISON:  Chest radiograph March 28, 2013  FINDINGS: Cardiomediastinal silhouette is unremarkable and unchanged. Mild chronic interstitial changes with elevated right hemidiaphragm, similarly blunted right costophrenic angle with linear density. No pneumothorax. Pulmonary vasculature is unremarkable. Interval extubation.  Left costophrenic angle is incompletely imaged. Multiple EKG lines overlie the patient and may obscure subtle underlying pathology. Included soft tissue planes and osseous structures are nonsuspicious.  IMPRESSION: Similar right lung base pleural thickening and scarring without superimposed acute cardiopulmonary process.   Electronically Signed   By: Awilda Metro   On: 07/05/2013 14:12   Ct Head Wo Contrast  07/05/2013   CLINICAL DATA:  Altered mental status.  EXAM: CT HEAD WITHOUT CONTRAST  TECHNIQUE: Contiguous axial images were obtained from the base of the skull through the vertex without intravenous contrast.  COMPARISON:  03/28/2013  FINDINGS: The  ventricles, cisterns and other CSF spaces are within normal. There is minimal chronic ischemic microvascular disease present. There is no mass, mass effect, shift of midline structures or acute hemorrhage. There is no evidence to suggest acute infarction. Remainder of the exam is unremarkable.  IMPRESSION: No acute intracranial findings.  Chronic ischemic microvascular disease.  Electronically Signed   By: Elberta Fortis M.D.   On: 07/05/2013 13:50    EKG Interpretation     Ventricular Rate:  120 PR Interval:  153 QRS Duration: 89 QT Interval:  341 QTC Calculation: 482 R Axis:   43 Text Interpretation:  Sinus tachycardia motion artifact limits evaluation.  No gross changes from prior.             MDM   1. Seizure    67 y.o. female w/ PMHx of seizure d/o on keppra, lamictal, recently off dilatntin presents w/ seizure activity concerning status epilepticus. Arrived with seizure activity that resolved in 1-2 minutes with subsequent improvement in mental status back to baseline per report. Glucose normal. Head CT normal. EKG unremarkable. CXR without infection. Labs unremarkable including neg troponin, lactate wnl. On re-eval pt awake, alert, answering questions. Nursing staff spoke with facility [facility] at [phone] to update. Neurology consulted. They will come see patient in ED.       I independently viewed, interpreted, and used in my medical decision making all ordered lab and imaging tests. Medical Decision Making discussed with ED attending Shanna Cisco, MD      Charm Barges, MD 07/05/13 [phone]

## 2013-07-05 NOTE — ED Notes (Signed)
Myself, Herbert Seta, EMT, Amil Amen, RN and Sharyl Nimrod ([employer] Student) changed pt's diaper; cleaned pt and placed clean linens on stretcher, placed chuks underneath pt and clean dry diaper on pt; situated pt on stretcher and placed warm blankets on pt

## 2013-07-05 NOTE — H&P (Signed)
Family Medicine Teaching [facility] Admission History and Physical Service Pager: [phone]  Patient name: [name] Medical record number: [id number] Date of birth: [birth year] Age: 67 y.o. Gender: female  Primary Care Provider: No PCP Per Patient Consultants: neurology Code Status: assume full code (pt unable to answer)  Chief Complaint: seizure activity  Assessment and Plan: [name] is a 67 y.o. female presenting with seizure . PMH is significant for seizure disorder, schizophrenia, CHF, HTN, diabetes, and Hepatitis A,B, and C.  # Seizure: pt with known seizure disorder, was seen in ER by neurology who made recs regarding seizure medications -admit to stepdown unit for close monitoring with seizure precautions -keppra and lamictal per neuro -STOP ultram on discharge (home medication, lowers seizure threshold) -IV ativan prn any seizure activity -monitor on telemetry  # Diabetes: does not appear to be on any diabetes meds at home - check A1c in AM, if >7 will start SSI  # Hypertension & CHF: continue home metoprolol, aspirin -monitor fluid status carefully  # Psych: continue home klonopin prn, will need to reorder home fluphenazine decanoate if stays in hospital for any prolonged period of time  FEN/GI: continue home colace, diabetic diet once passes RN swallow eval, gently hydrate O/N with NS @ 75 cc/hr given tachycardia Prophylaxis: SCD's (heparin has apparent allergic cross-reactivity with haldol?)  Disposition: pending stabilization on new antiepileptic regimen  History of Present Illness: [name] is a 67 y.o. female presenting with seizures. All history obtained from ER physician, as no family and her friends were present with patient when I interviewed her. Patient was too altered to answer my questions intelligibly, although this is possibly her baseline.  Per ER physician, patient had toxic level of Dilantin in one week ago, so this medication  was discontinued. Patient lives at assisted living facility. Around 11:30 AM she was found unresponsive with bilateral tremors of her upper extremities. She was incontinent at this time. She was given Versed, which led to improvement, but her seizures returned so she was given more Versed. At this point her seizures resolved. She was brought to the ER and was lethargic upon arrival. She has not had any seizure activity noted since arrival. There was concern for status epilepticus, so neuro was consulted in the ER and made medication adjustments.  Review Of Systems: Unable to obtain secondary to mental status.  Patient Active Problem List   Diagnosis Date Noted  . Altered mental status 04/01/2013  . Anemia 03/31/2013  . Status epilepticus 03/28/2013  . Acute respiratory failure 03/28/2013  . DM (diabetes mellitus) 03/28/2013   Past Medical History: Past Medical History  Diagnosis Date  . CHF (congestive heart failure)   . Seizures   . Schizophrenia   . Diabetes mellitus without complication   . Hypertension   . Asthma   . Hepatitis A   . Hepatitis B   . Hepatitis C   . Respiratory failure   . Smoker     1 pack daily   Past Surgical History: No past surgical history on file. Social History: History  Substance Use Topics  . Smoking status: Current Every Day Smoker -- 1.00 packs/day for 50 years    Types: Cigarettes  . Smokeless tobacco: Not on file  . Alcohol Use: Not on file   Additional social history: Unable to obtain  Please also refer to relevant sections of EMR.  Family History: No family history on file. Allergies and Medications: Allergies  Allergen Reactions  .  Depakote [Valproic Acid]   . Haldol [Haloperidol Lactate]   . Penicillins   . Shellfish Allergy   . Trileptal [Oxcarbazepine]    No current facility-administered medications on file prior to encounter.   Current Outpatient Prescriptions on File Prior to Encounter  Medication Sig Dispense Refill  .  acetaminophen (TYLENOL) 500 MG tablet Take 500 mg by mouth every 6 (six) hours as needed for pain.      [name] aspirin EC 81 MG tablet Take 81 mg by mouth daily.      . B Complex-C (B-COMPLEX WITH VITAMIN C) tablet Take 1 tablet by mouth daily.      . calcium carbonate (TUMS - DOSED IN MG ELEMENTAL CALCIUM) 500 MG chewable tablet Chew 1 tablet by mouth 2 (two) times daily.      [name] docusate sodium (COLACE) 100 MG capsule Take 100 mg by mouth 2 (two) times daily.      . insulin aspart (NOVOLOG FLEXPEN) 100 UNIT/ML SOPN FlexPen Inject 0-8 Units into the skin 3 (three) times daily with meals. Per blood sugar level sliding scale      . thiamine (VITAMIN B-1) 100 MG tablet Take 100 mg by mouth daily.        Objective: BP 100/72  Pulse 123  Temp(Src) 99.8 F (37.7 C) (Rectal)  Resp 24  SpO2 100% Exam: General: No acute distress, lying in bed comfortably, has been incontinent of urine on herself through diaper HEENT: Normocephalic, atraumatic, pupils equal round reactive to light, moist mucous membranes, no oropharyngeal exudates Cardiovascular: Regular rate and rhythm, no murmurs auscultated Respiratory: Clear to auscultation bilaterally via anterior auscultation, normal respiratory effort Abdomen: Soft, nontender to palpation, no organomegaly appreciated Extremities: 2+ DP pulses in bilateral lower extremities Skin: No rashes noted Neuro: Pupils equal round and reactive, patient does not fully participate in neurological exam. She does spontaneously move all extremities. She follows simple commands. She is not fully oriented. She is able to tell me her name, but when asked what year it is she says "19 hundred and 14 milligrams".  Labs and Imaging: CBC  Recent Labs Lab 07/05/13 1251  WBC 4.8  HGB 12.9  HCT 39.0  PLT 210     CMET  Recent Labs Lab 07/05/13 1251  NA 138  K 4.5  CL 101  CO2 25  BUN 12  CREATININE 0.44*  GLUCOSE 108*  CALCIUM 10.1  AST 30  ALT 23  ALKPHOS 169*   PROT 8.1  ALBUMIN 4.0      Urinalysis    Component Value Date/Time   COLORURINE YELLOW 07/05/2013 1433   APPEARANCEUR CLEAR 07/05/2013 1433   LABSPEC 1.015 07/05/2013 1433   PHURINE 7.5 07/05/2013 1433   GLUCOSEU NEGATIVE 07/05/2013 1433   HGBUR TRACE* 07/05/2013 1433   BILIRUBINUR NEGATIVE 07/05/2013 1433   KETONESUR 40* 07/05/2013 1433   PROTEINUR NEGATIVE 07/05/2013 1433   UROBILINOGEN 0.2 07/05/2013 1433   NITRITE NEGATIVE 07/05/2013 1433   LEUKOCYTESUR NEGATIVE 07/05/2013 1433   Urine micro few bacteria, 0-2 wbc  UDS + for benzos Lactate 1.9 Trop neg CBG 91  CT Head: No acute intracranial findings.  Chronic ischemic microvascular disease.  CXR; Similar right lung base pleural thickening and scarring without  superimposed acute cardiopulmonary process.  EKG: sinus tachycardia   [name], MD 07/05/2013, 4:35 PM PGY-2, [facility] FPTS Intern pager: [phone], text pages welcome

## 2013-07-05 NOTE — ED Notes (Signed)
Pt undressed, in gown, on monitor, continuous pulse oximetry, blood pressure cuff and oxygen Center (2L); vitals and EKG being performed; warm blankets given to pt

## 2013-07-05 NOTE — ED Notes (Signed)
Returned from xray

## 2013-07-05 NOTE — ED Notes (Signed)
Have attempted to call report to 2C x 2, unable to take report at this time.

## 2013-07-05 NOTE — ED Notes (Signed)
Informed admitting md that pts HR and BP are elevated, changed order to stepdown.

## 2013-07-05 NOTE — ED Provider Notes (Signed)
  Physical Exam  BP 100/72  Pulse 123  Temp(Src) 99.8 F (37.7 C) (Rectal)  Resp 24  SpO2 100%  Physical Exam  ED Course  Procedures  MDM Assumed care from Dr. Gregary Cromer at 1600 please see his note for history of present illness and because of Until that point. Briefly this is a 67 year old Caucasian female who comes emergency department today with status epilepticus. She is currently back to her baseline mental status. Labs and imaging have been obtained and are unremarkable. Neurology has been consulted. We are awaiting their recommendations. Neurology recommended that the patient be admitted to the hospital for medication adjustments. Patient was admitted to family medicine service in stable condition. Care was discussed with the attending Dr. Micheline Maze.      Bethann Berkshire, MD 07/05/13 [phone]

## 2013-07-05 NOTE — ED Notes (Signed)
Neurology at bedside to eval pt.

## 2013-07-05 NOTE — ED Provider Notes (Addendum)
Medical screening examination/treatment/procedure(s) were conducted as a shared visit with resident-physician practitioner(s) and myself.  I personally evaluated the patient during the encounter. I agree with resident EKG interpretation.  Shanna Cisco, MD 07/05/13 6213  Shanna Cisco, MD 07/12/13 [phone]

## 2013-07-05 NOTE — Consult Note (Addendum)
NEURO HOSPITALIST CONSULT NOTE    Reason for Consult: possible SE  HPI:                                                                                                                                          [name] is an 67 y.o. female, right handed, with a past medical history significant for HTN, DM, chronic congestive heart failure, asthma, hepatitis A, hepatitis B, schizophrenia, heavy smoking, and seizures, brought to [facility] ED by medics due to recurrent GTC seizures. She lives at a facility and was last seen normal around 9 am today, and then found unresponsive at 1130 am. When EMS  arrived she was described as " unresponsive, with tremors and right gaze preference". Received 2 mg IV versed and stopped seizing but then the seizures returned and she got another dose IV versed. Initial work up in the ED revealed no acute metabolic derangements, or evidence of infection. Urine drug screen is unimpressive. CT brain revealed no acute abnormality. According to chart review, [name] has a history of seizures and has been on triple AED regimen with keppra, Lamictal, and dilantin until recently when the dilantin was discontinued due to dilantin toxicity. Patient is now virtually back to baseline and denies HA, vertigo, double vision, difficulty swallowing, slurred speech, language or vision impairment.    Past Medical History  Diagnosis Date  . CHF (congestive heart failure)   . Seizures   . Schizophrenia   . Diabetes mellitus without complication   . Hypertension   . Asthma   . Hepatitis A   . Hepatitis B   . Hepatitis C   . Respiratory failure   . Smoker     1 pack daily    No past surgical history on file.  No family history on file.   Social History:  reports that she has been smoking Cigarettes.  She has a 50 pack-year smoking history. She does not have any smokeless tobacco history on file. Her alcohol and drug histories are not on  file.  Allergies  Allergen Reactions  . Depakote [Valproic Acid]   . Haldol [Haloperidol Lactate]   . Penicillins   . Shellfish Allergy   . Trileptal [Oxcarbazepine]     MEDICATIONS:  I have reviewed the patient's current medications.   ROS:                                                                                                                                       History obtained from chart review  General ROS: negative for - chills, fatigue, fever, night sweats, weight gain or weight loss Psychological ROS: negative for - behavioral disorder, hallucinations, memory difficulties, mood swings or suicidal ideation Ophthalmic ROS: negative for - blurry vision, double vision, eye pain or loss of vision ENT ROS: negative for - epistaxis, nasal discharge, oral lesions, sore throat, tinnitus or vertigo Allergy and Immunology ROS: negative for - hives or itchy/watery eyes Hematological and Lymphatic ROS: negative for - bleeding problems, bruising or swollen lymph nodes Endocrine ROS: negative for - galactorrhea, hair pattern changes, polydipsia/polyuria or temperature intolerance Respiratory ROS: negative for - cough, hemoptysis, shortness of breath or wheezing Cardiovascular ROS: negative for - chest pain, dyspnea on exertion, edema or irregular heartbeat Gastrointestinal ROS: negative for - abdominal pain, diarrhea, hematemesis, nausea/vomiting or stool incontinence Genito-Urinary ROS: negative for - dysuria, hematuria, incontinence or urinary frequency/urgency Musculoskeletal ROS: negative for - joint swelling or muscular weakness Neurological ROS: as noted in HPI Dermatological ROS: negative for rash and skin lesion changes   Physical exam: restless but follows commands. Blood pressure 100/72, pulse 123, temperature 99.8 F (37.7 C), temperature source Rectal,  resp. rate 24, SpO2 100.00%. Head: normocephalic. Neck: supple, no bruits, no JVD. Cardiac: no murmurs. Lungs: clear. Abdomen: soft, no tender, no mass. Extremities: no edema.  Neurologic Examination:                                                                                                      Mental Status: Alert, oriented to place and person.  Speech fluent without evidence of aphasia.  Able to follow 3 step commands without difficulty. Cranial Nerves: II: Discs flat bilaterally; Visual fields grossly normal, pupils equal, round, reactive to light and accommodation III,IV, VI: ptosis not present, extra-ocular motions intact bilaterally V,VII: smile symmetric, facial light touch sensation normal bilaterally VIII: hearing normal bilaterally IX,X: gag reflex present XI: bilateral shoulder shrug XII: midline tongue extension without atrophy or fasciculations  Motor: Moves all limbs symmetrically Tone and bulk:normal tone throughout; no atrophy noted Sensory: Pinprick and light touch intact throughout, bilaterally Deep Tendon Reflexes:  Right: Upper Extremity   Left: Upper extremity   biceps (C-5 to C-6) 2/4   biceps (  C-5 to C-6) 2/4 tricep (C7) 2/4    triceps (C7) 2/4 Brachioradialis (C6) 2/4  Brachioradialis (C6) 2/4  Lower Extremity Lower Extremity  quadriceps (L-2 to L-4) 2/4   quadriceps (L-2 to L-4) 2/4 Achilles (S1) 2/4   Achilles (S1) 2/4  Plantars: Right: downgoing   Left: downgoing Cerebellar: No tested. Gait: No tested CV: pulses palpable throughout    No results found for this basename: cbc, bmp, coags, chol, tri, ldl, hga1c    Results for orders placed during the hospital encounter of 07/05/13 (from the past 48 hour(s))  CBC WITH DIFFERENTIAL     Status: None   Collection Time    07/05/13 12:51 PM      Result Value Range   WBC 4.8  4.0 - 10.5 K/uL   RBC 4.19  3.87 - 5.11 MIL/uL   Hemoglobin 12.9  12.0 - 15.0 g/dL   HCT 16.1  09.6 - 04.5 %   MCV  93.1  78.0 - 100.0 fL   MCH 30.8  26.0 - 34.0 pg   MCHC 33.1  30.0 - 36.0 g/dL   RDW 40.9  81.1 - 91.4 %   Platelets 210  150 - 400 K/uL   Neutrophils Relative % 61  43 - 77 %   Neutro Abs 2.9  1.7 - 7.7 K/uL   Lymphocytes Relative 30  12 - 46 %   Lymphs Abs 1.4  0.7 - 4.0 K/uL   Monocytes Relative 9  3 - 12 %   Monocytes Absolute 0.4  0.1 - 1.0 K/uL   Eosinophils Relative 0  0 - 5 %   Eosinophils Absolute 0.0  0.0 - 0.7 K/uL   Basophils Relative 0  0 - 1 %   Basophils Absolute 0.0  0.0 - 0.1 K/uL  COMPREHENSIVE METABOLIC PANEL     Status: Abnormal   Collection Time    07/05/13 12:51 PM      Result Value Range   Sodium 138  135 - 145 mEq/L   Potassium 4.5  3.5 - 5.1 mEq/L   Chloride 101  96 - 112 mEq/L   CO2 25  19 - 32 mEq/L   Glucose, Bld 108 (*) 70 - 99 mg/dL   BUN 12  6 - 23 mg/dL   Creatinine, Ser 7.82 (*) 0.50 - 1.10 mg/dL   Calcium 95.6  8.4 - 21.3 mg/dL   Total Protein 8.1  6.0 - 8.3 g/dL   Albumin 4.0  3.5 - 5.2 g/dL   AST 30  0 - 37 U/L   ALT 23  0 - 35 U/L   Alkaline Phosphatase 169 (*) 39 - 117 U/L   Total Bilirubin 0.2 (*) 0.3 - 1.2 mg/dL   GFR calc non Af Amer >90  >90 mL/min   GFR calc Af Amer >90  >90 mL/min   Comment: (NOTE)     The eGFR has been calculated using the CKD EPI equation.     This calculation has not been validated in all clinical situations.     eGFR's persistently <90 mL/min signify possible Chronic Kidney     Disease.  GLUCOSE, CAPILLARY     Status: None   Collection Time    07/05/13 12:54 PM      Result Value Range   Glucose-Capillary 91  70 - 99 mg/dL   Comment 1 Documented in Chart     Comment 2 Notify RN    TROPONIN I     Status: None  Collection Time    07/05/13 12:57 PM      Result Value Range   Troponin I <0.30  <0.30 ng/mL   Comment:            Due to the release kinetics of cTnI,     a negative result within the first hours     of the onset of symptoms does not rule out     myocardial infarction with certainty.     If  myocardial infarction is still suspected,     repeat the test at appropriate intervals.  CG4 I-STAT (LACTIC ACID)     Status: None   Collection Time    07/05/13  1:56 PM      Result Value Range   Lactic Acid, Venous 1.90  0.5 - 2.2 mmol/L  URINALYSIS, ROUTINE W REFLEX MICROSCOPIC     Status: Abnormal   Collection Time    07/05/13  2:33 PM      Result Value Range   Color, Urine YELLOW  YELLOW   APPearance CLEAR  CLEAR   Specific Gravity, Urine 1.015  1.005 - 1.030   pH 7.5  5.0 - 8.0   Glucose, UA NEGATIVE  NEGATIVE mg/dL   Hgb urine dipstick TRACE (*) NEGATIVE   Bilirubin Urine NEGATIVE  NEGATIVE   Ketones, ur 40 (*) NEGATIVE mg/dL   Protein, ur NEGATIVE  NEGATIVE mg/dL   Urobilinogen, UA 0.2  0.0 - 1.0 mg/dL   Nitrite NEGATIVE  NEGATIVE   Leukocytes, UA NEGATIVE  NEGATIVE  URINE RAPID DRUG SCREEN (HOSP PERFORMED)     Status: Abnormal   Collection Time    07/05/13  2:33 PM      Result Value Range   Opiates NONE DETECTED  NONE DETECTED   Cocaine NONE DETECTED  NONE DETECTED   Benzodiazepines POSITIVE (*) NONE DETECTED   Amphetamines NONE DETECTED  NONE DETECTED   Tetrahydrocannabinol NONE DETECTED  NONE DETECTED   Barbiturates NONE DETECTED  NONE DETECTED   Comment:            DRUG SCREEN FOR MEDICAL PURPOSES     ONLY.  IF CONFIRMATION IS NEEDED     FOR ANY PURPOSE, NOTIFY LAB     WITHIN 5 DAYS.                LOWEST DETECTABLE LIMITS     FOR URINE DRUG SCREEN     Drug Class       Cutoff (ng/mL)     Amphetamine      1000     Barbiturate      200     Benzodiazepine   200     Tricyclics       300     Opiates          300     Cocaine          300     THC              50  URINE MICROSCOPIC-ADD ON     Status: Abnormal   Collection Time    07/05/13  2:33 PM      Result Value Range   Squamous Epithelial / LPF RARE  RARE   WBC, UA 0-2  <3 WBC/hpf   RBC / HPF 0-2  <3 RBC/hpf   Bacteria, UA FEW (*) RARE   Urine-Other LESS THAN 10 mL OF URINE SUBMITTED     Comment:  MICROSCOPIC EXAM PERFORMED ON  UNCONCENTRATED URINE    Dg Chest 1 View  07/05/2013   CLINICAL DATA:  Altered mental status.  EXAM: CHEST - 1 VIEW  COMPARISON:  Chest radiograph March 28, 2013  FINDINGS: Cardiomediastinal silhouette is unremarkable and unchanged. Mild chronic interstitial changes with elevated right hemidiaphragm, similarly blunted right costophrenic angle with linear density. No pneumothorax. Pulmonary vasculature is unremarkable. Interval extubation.  Left costophrenic angle is incompletely imaged. Multiple EKG lines overlie the patient and may obscure subtle underlying pathology. Included soft tissue planes and osseous structures are nonsuspicious.  IMPRESSION: Similar right lung base pleural thickening and scarring without superimposed acute cardiopulmonary process.   Electronically Signed   By: Awilda Metro   On: 07/05/2013 14:12   Ct Head Wo Contrast  07/05/2013   CLINICAL DATA:  Altered mental status.  EXAM: CT HEAD WITHOUT CONTRAST  TECHNIQUE: Contiguous axial images were obtained from the base of the skull through the vertex without intravenous contrast.  COMPARISON:  03/28/2013  FINDINGS: The ventricles, cisterns and other CSF spaces are within normal. There is minimal chronic ischemic microvascular disease present. There is no mass, mass effect, shift of midline structures or acute hemorrhage. There is no evidence to suggest acute infarction. Remainder of the exam is unremarkable.  IMPRESSION: No acute intracranial findings.  Chronic ischemic microvascular disease.   Electronically Signed   By: Elberta Fortis M.D.   On: 07/05/2013 13:50       Assessment/Plan:  67 y/o with cluster of seizures earlier today. Mental status is much improved and her neuro-exam is non focal. CT brain, electrolytes, and urine drug screen unimpressive. She ws recently taking off Dilantin due to dilantin toxicity. She is on low dose keppra and lamictal at this moment. Recommend: 1) Loading dose  1 gram IV keppra now. 2) Increase daily maintenance dose keppra to 750 mg BID. 3) Continue Lamictal. 4) Do not resume dilantin. 5) Will follow up. 6) No need for EEG or MRI brain at this moment 7)  D/C [name] Anger, MD  [facility] [phone]  07/05/2013, 3:54 PM    Patient seen and examined together with physician assistant and I concur with the assessment and plan.  Wyatt Portela, MD

## 2013-07-05 NOTE — ED Notes (Addendum)
Pt arrived to ED via [facility], pt is a resident at [facility]. EMS reports pt was LSN by staff at 0900 this am, and at approx 1130 pt was found unresponsive, upon EMS arrival pt was still unresponsive, having tremors that resembled seizure like activity, and a Right sided gaze. The facility informed EMS pt had a Dilantin toxicity and they discontinued her Dilantin on Friday. Pt received Versed 4 mg in increments of two, which caused her seizure's to subside. EMS started a 20 G SL to Right chest, CBG 96, VS SBP 130-190, HR 110-120, O2 sats 88% RA, placed on 3 L increasing to 90-100%

## 2013-07-06 ENCOUNTER — Inpatient Hospital Stay (HOSPITAL_COMMUNITY): Payer: Medicare Other

## 2013-07-06 DIAGNOSIS — R4182 Altered mental status, unspecified: Secondary | ICD-10-CM

## 2013-07-06 DIAGNOSIS — R569 Unspecified convulsions: Secondary | ICD-10-CM

## 2013-07-06 LAB — CBC
Platelets: 215 10*3/uL (ref 150–400)
RBC: 3.98 MIL/uL (ref 3.87–5.11)
RDW: 13.9 % (ref 11.5–15.5)
WBC: 4.5 10*3/uL (ref 4.0–10.5)

## 2013-07-06 LAB — HEMOGLOBIN A1C
Hgb A1c MFr Bld: 6.7 % — ABNORMAL HIGH (ref <5.7)
Mean Plasma Glucose: 146 mg/dL — ABNORMAL HIGH (ref <117)

## 2013-07-06 LAB — BASIC METABOLIC PANEL
Calcium: 9.5 mg/dL (ref 8.4–10.5)
Chloride: 104 mEq/L (ref 96–112)
Creatinine, Ser: 0.48 mg/dL — ABNORMAL LOW (ref 0.50–1.10)
GFR calc Af Amer: >90 mL/min (ref >90)
Sodium: 139 mEq/L (ref 135–145)

## 2013-07-06 LAB — PHENYTOIN LEVEL, TOTAL: Phenytoin Lvl: 20.3 ug/mL — ABNORMAL HIGH (ref 10.0–20.0)

## 2013-07-06 MED ORDER — SODIUM CHLORIDE 0.9 % IV SOLN
1000.0000 mg | Freq: Once | INTRAVENOUS | Status: AC
  Administered 2013-07-06: 1000 mg via INTRAVENOUS
  Filled 2013-07-06: qty 20

## 2013-07-06 MED ORDER — LABETALOL HCL 5 MG/ML IV SOLN
5.0000 mg | Freq: Once | INTRAVENOUS | Status: AC
  Administered 2013-07-06: 5 mg via INTRAVENOUS
  Filled 2013-07-06: qty 4

## 2013-07-06 MED ORDER — LORAZEPAM 2 MG/ML IJ SOLN
INTRAMUSCULAR | Status: AC
  Administered 2013-07-06: 2 mg via INTRAVENOUS
  Filled 2013-07-06: qty 1

## 2013-07-06 MED ORDER — LORAZEPAM 2 MG/ML IJ SOLN
1.0000 mg | INTRAMUSCULAR | Status: DC | PRN
  Administered 2013-07-06 – 2013-07-09 (×2): 2 mg via INTRAVENOUS
  Administered 2013-07-13: 1 mg via INTRAVENOUS
  Filled 2013-07-06: qty 1

## 2013-07-06 MED ORDER — POTASSIUM CHLORIDE CRYS ER 20 MEQ PO TBCR
20.0000 meq | EXTENDED_RELEASE_TABLET | Freq: Every day | ORAL | Status: DC
  Administered 2013-07-09: 20 meq via ORAL
  Filled 2013-07-06 (×3): qty 1

## 2013-07-06 MED ORDER — PHENYTOIN SODIUM 50 MG/ML IJ SOLN
100.0000 mg | Freq: Three times a day (TID) | INTRAMUSCULAR | Status: DC
  Administered 2013-07-07 – 2013-07-13 (×19): 100 mg via INTRAVENOUS
  Filled 2013-07-06 (×23): qty 2

## 2013-07-06 NOTE — Progress Notes (Signed)
UR COMPLETED.  Patient changed to IP status.  Patient is continuing to have seizures.

## 2013-07-06 NOTE — H&P (Addendum)
[facility] Attending Admission Note: [name] Lansdowne,MD I  have seen and examined this patient, reviewed their chart. I have discussed this patient with the resident. I agree with the resident's findings, assessment and care plan.  Patient seen this morning, communicating but not coherent.She stated she is in the hospital for brown eye,she does not remember she had seizure, she mentioned her home address where she lives with her mother and father,eventhough she was brought from a group home. She denies any pain or headache this morning. Patient stated she had been compliant with all her home medications.  Filed Vitals:   07/05/13 2000 07/06/13 0017 07/06/13 0409 07/06/13 0700  BP: 154/91 130/72 126/59 147/82  Pulse: 123 105 118 110  Temp: 100.1 F (37.8 C) 97.6 F (36.4 C) 98.9 F (37.2 C) 98.6 F (37 C)  TempSrc: Oral Oral Oral Oral  Resp: 19 17 22 18   Height: 5\' 5"  (1.651 m)     Weight: 143 lb 11.8 oz (65.2 kg)     SpO2: 97% 91% 92% 98%    Exam: Gen: Awake and alert, not in distress. HEENT: PERRLA,EOMI Neuro:MMSE poor (Recommend recheck by resident). Power about 5/5 across all joint, CN grossly intact,motor intact,gait not assessed. Resp: Air entry equal B/L. CV; S1 S2 normal,no murmurs. Abd: benign. Ext: No edema.  A/P:  1. Status epilepticus: Resolved. CT head reviewed.     S/P Keppra load.     Currently on Keppra IV and Lamictal.     Neuro follow up today.     Fall and seizure precaution recommended.  2. DM: She does not seem to be on any medication.     Her capillary and serum glucose not too elevated.     I agree with A1C pending starting any medication.  Other chronic problem,to continue home regimen.  Discussed and signed out to Dr Mauricio Po.

## 2013-07-06 NOTE — Consult Note (Signed)
PULMONARY  / CRITICAL CARE MEDICINE  Name: [name] MRN: [id number] DOB: 1946-01-16    ADMISSION DATE:  07/05/2013 CONSULTATION DATE:  07/06/2013  REFERRING MD :  Lelon Mast PRIMARY SERVICE: [facility]  CHIEF COMPLAINT:  Acute encephalopathy  BRIEF PATIENT DESCRIPTION: 67 yo NH resident admitted 11/6 with seizures.  On 11/7 patient had several seizures associated with brief apnea and fall in SpO2.  SIGNIFICANT EVENTS / STUDIES:  11/6  Admitted with seizures 11/6  Head CT >>> nad 11/7  Seizures with apnea and desaturation.  PCCM consulted.  LINES / TUBES:  CULTURES:  ANTIBIOTICS:  HISTORY OF PRESENT ILLNESS:  [name] is an 67 y.o. female, with a PMH significant for HTN, DM, chronic congestive heart failure, asthma, hepatitis A, hepatitis B, schizophrenia, heavy smoking, and seizures, brought to [facility] ED on 11/6 by medics due to recurrent GTC seizures.  She lives at a facility and was last seen normal around 9 am on 11/6, and then found unresponsive at 1130 am. When EMS arrived she was described as " unresponsive, with tremors and right gaze preference". Received 2 mg IV versed and stopped seizing but then the seizures returned and she got another dose IV versed. Initial work up in the ED revealed no acute metabolic derangements, or evidence of infection. Urine drug screen was unimpressive. CT brain revealed no acute abnormality. According to chart review, [name] has a history of seizures and has been on triple AED regimen with keppra, Lamictal, and dilantin until recently when the dilantin was discontinued due to dilantin toxicity. On 11/7, shortly after lunch time, [facility] resident asked [place] to evaluate the pt for concerns about airway protection.  Pt apparently had a 2 second seizure earlier, followed by another longer 1 -2 minute seizure with facial twitching and pooling of saliva in the mouth.  Pt was given 2mg  of Ativan which helped stop seizure event.  A loading dose of 1g Dilantin was  also ordered.  After seizure, pt reportedly had an apneic episode lasting approximately 60 sec and SpO2 dropped into the 60s.  O2 was increased, then replaced by Non-rebreather. SpO2 returned to 100%.  Due to potential decompensation or failure to maintain airway, PCCM was asked to evaluate and is doing so now.  During our exam, pt is awake but not responding or following commands.  She is maintaining SpO2 around 98% on [place].   PAST MEDICAL HISTORY :  Past Medical History  Diagnosis Date  . CHF (congestive heart failure)   . Seizures   . Schizophrenia   . Diabetes mellitus without complication   . Hypertension   . Asthma   . Hepatitis A   . Hepatitis B   . Hepatitis C   . Respiratory failure   . Smoker     1 pack daily   No past surgical history on file. Prior to Admission medications   Medication Sig Start Date End Date Taking? Authorizing Provider  acetaminophen (TYLENOL) 500 MG tablet Take 500 mg by mouth every 6 (six) hours as needed for pain.   Yes Historical Provider, MD  aspirin EC 81 MG tablet Take 81 mg by mouth daily.   Yes Historical Provider, MD  B Complex-C (B-COMPLEX WITH VITAMIN C) tablet Take 1 tablet by mouth daily.   Yes Historical Provider, MD  calcium carbonate (TUMS - DOSED IN MG ELEMENTAL CALCIUM) 500 MG chewable tablet Chew 1 tablet by mouth 2 (two) times daily.   Yes Historical Provider, MD  clonazePAM (KLONOPIN) 0.5 MG tablet Take 0.5 mg by mouth 2 (two) times daily as needed for anxiety.   Yes Historical Provider, MD  docusate sodium (COLACE) 100 MG capsule Take 100 mg by mouth 2 (two) times daily.   Yes Historical Provider, MD  fluPHENAZine (PROLIXIN) 5 MG tablet Take 5 mg by mouth every 6 (six) hours as needed (for breakthrough).   Yes Historical Provider, MD  fluPHENAZine decanoate (PROLIXIN) 25 MG/ML injection Inject into the muscle every 14 (fourteen) days.   Yes Historical Provider, MD  insulin aspart (NOVOLOG FLEXPEN) 100 UNIT/ML SOPN FlexPen Inject 0-8  Units into the skin 3 (three) times daily with meals. Per blood sugar level sliding scale   Yes Historical Provider, MD  lamoTRIgine (LAMICTAL) 150 MG tablet Take 150 mg by mouth at bedtime.   Yes Historical Provider, MD  levETIRAcetam (KEPPRA) 500 MG tablet Take 500 mg by mouth 2 (two) times daily.   Yes Historical Provider, MD  magnesium oxide (MAG-OX) 400 MG tablet Take 400 mg by mouth daily.   Yes Historical Provider, MD  metoprolol (LOPRESSOR) 50 MG tablet Take 25 mg by mouth 2 (two) times daily.   Yes Historical Provider, MD  thiamine (VITAMIN B-1) 100 MG tablet Take 100 mg by mouth daily.   Yes Historical Provider, MD   Allergies  Allergen Reactions  . Depakote [Valproic Acid]   . Haldol [Haloperidol Lactate]   . Penicillins   . Shellfish Allergy   . Trileptal [Oxcarbazepine]    FAMILY HISTORY:  No family history on file.  SOCIAL HISTORY:  reports that she has been smoking Cigarettes.  She has a 50 pack-year smoking history. She does not have any smokeless tobacco history on file. Her alcohol and drug histories are not on file.  REVIEW OF SYSTEMS:  Unable to complete as pt is not answering questions.  SUBJECTIVE:   VITAL SIGNS: Temp:  [97.6 F (36.4 C)-100.1 F (37.8 C)] 98.8 F (37.1 C) (11/07 1129) Pulse Rate:  [85-144] 124 (11/07 1236) Resp:  [15-27] 24 (11/07 1236) BP: (126-195)/(59-170) 191/91 mmHg (11/07 1236) SpO2:  [91 %-100 %] 94 % (11/07 1129) Weight:  [143 lb 11.8 oz (65.2 kg)] 143 lb 11.8 oz (65.2 kg) (11/06 2000)  HEMODYNAMICS:   VENTILATOR SETTINGS:   INTAKE / OUTPUT: Intake/Output     11/06 0701 - 11/07 0700 11/07 0701 - 11/08 0700   I.V. (mL/kg) 1690 (25.9) 228 (3.5)   Total Intake(mL/kg) 1690 (25.9) 228 (3.5)   Net +1690 +228        Urine Occurrence 3 x 1 x   Stool Occurrence 1 x 2 x     PHYSICAL EXAMINATION: General:  Chronically ill appearing female, in bed, in NAD. Neuro:  Does not answer questions or follow commands. HEENT:  [place]/AT.  PERRL.  MMM. Cardiovascular:  RRR, no M/R/G. Lungs:  Resps even and unlabored.  Coarse breath sounds throughout. Abdomen:  BS x 4.  Soft, NT/ND. Musculoskeletal:  No gross deformities.  No edema. Skin:  Warm and dry.   Recent Labs Lab 07/05/13 1251 07/05/13 1257 07/05/13 1356 07/06/13 0547  HGB 12.9  --   --  12.0  WBC 4.8  --   --  4.5  PLT 210  --   --  215  NA 138  --   --  139  K 4.5  --   --  3.4*  CL 101  --   --  104  CO2 25  --   --  21  GLUCOSE 108*  --   --  91  BUN 12  --   --  9  CREATININE 0.44*  --   --  0.48*  CALCIUM 10.1  --   --  9.5  AST 30  --   --   --   ALT 23  --   --   --   ALKPHOS 169*  --   --   --   BILITOT 0.2*  --   --   --   PROT 8.1  --   --   --   ALBUMIN 4.0  --   --   --   LATICACIDVEN  --   --  1.90  --   TROPONINI  --  <0.30  --   --     Recent Labs Lab 07/05/13 1254  GLUCAP 91   CXR:    ASSESSMENT / PLAN:  Seizures - [place] Tonic Clonic. - Seizure precautions. - Load with 1g Dilantin then continue 100mg  IV BID. - STAT Brain CT. - STAT continuous EEG. - Cont Keppra and Lamictal. - May need brain MRI if CT unremarkable. - Neuro following.  Concern for ability to protect airway.  Gag and cough intact at this time. - Monitor SpO2 and work of breathing. - May require intubation if change in neurological status - f/u CXR in AM.  HTN, CHF - per primary team  DM - per primary team - avoid hypoglycemia  Rutherford Guys, PA - S  Maintains airway.  No indications for ICU transfer, intubation or mechanical ventilation.  PCCM will sign off.  Please reconsult if necessary.  I have personally obtained a history, examined the patient, evaluated laboratory and imaging results, formulated the assessment and plan and placed orders.  Lonia Farber, MD Pulmonary and Critical Care Medicine [facility] Pager: [phone]  07/06/2013, 1:31 PM

## 2013-07-06 NOTE — Plan of Care (Signed)
Problem: Phase I Progression Outcomes Goal: Voiding-avoid urinary catheter unless indicated Outcome: Completed/Met Date Met:  07/06/13 Pt is incontinent of urine

## 2013-07-06 NOTE — Progress Notes (Addendum)
NEURO HOSPITALIST PROGRESS NOTE   SUBJECTIVE:                                                                                                                        Did well until few minutes ago when started seizing again. Nursing staff indicated that she was having back to back seizures without regaining consciousness in between seizures. Ativan 2 mg IV given and ordered loading dose 1 gram dilantin. She is also on maintenance dose keppra 750 mg BID and Lamictal and Lamotrigine 150 mg daily.  OBJECTIVE:                                                                                                                           Vital signs in last 24 hours: Temp:  [97.6 F (36.4 C)-100.1 F (37.8 C)] 98.8 F (37.1 C) (11/07 1129) Pulse Rate:  [85-144] 124 (11/07 1236) Resp:  [15-27] 24 (11/07 1236) BP: (126-195)/(59-170) 191/91 mmHg (11/07 1236) SpO2:  [91 %-100 %] 94 % (11/07 1129) Weight:  [65.2 kg (143 lb 11.8 oz)] 65.2 kg (143 lb 11.8 oz) (11/06 2000)  Intake/Output from previous day: 11/06 0701 - 11/07 0700 In: 1690 [I.V.:1690] Out: -  Intake/Output this shift: Total I/O In: 228 [I.V.:228] Out: -  Nutritional status: Carb Control  Past Medical History  Diagnosis Date  . CHF (congestive heart failure)   . Seizures   . Schizophrenia   . Diabetes mellitus without complication   . Hypertension   . Asthma   . Hepatitis A   . Hepatitis B   . Hepatitis C   . Respiratory failure   . Smoker     1 pack daily    Neurologic Exam:  Mental status: unresponsive.Head version to the left. CN 2-12: gaze preference to the left. Pupils equal size and reactive. Motor, sensory, DTRs, coordination and gait no tested.   Lab Results: No results found for this basename: cbc, bmp, coags, chol, tri, ldl, hga1c   Lipid Panel No results found for this basename: CHOL, TRIG, HDL, CHOLHDL, VLDL, LDLCALC,  in the last 72 hours  Studies/Results: Dg  Chest 1 View  07/05/2013   CLINICAL DATA:  Altered mental status.  EXAM: CHEST - 1  VIEW  COMPARISON:  Chest radiograph March 28, 2013  FINDINGS: Cardiomediastinal silhouette is unremarkable and unchanged. Mild chronic interstitial changes with elevated right hemidiaphragm, similarly blunted right costophrenic angle with linear density. No pneumothorax. Pulmonary vasculature is unremarkable. Interval extubation.  Left costophrenic angle is incompletely imaged. Multiple EKG lines overlie the patient and may obscure subtle underlying pathology. Included soft tissue planes and osseous structures are nonsuspicious.  IMPRESSION: Similar right lung base pleural thickening and scarring without superimposed acute cardiopulmonary process.   Electronically Signed   By: Awilda Metro   On: 07/05/2013 14:12   Ct Head Wo Contrast  07/05/2013   CLINICAL DATA:  Altered mental status.  EXAM: CT HEAD WITHOUT CONTRAST  TECHNIQUE: Contiguous axial images were obtained from the base of the skull through the vertex without intravenous contrast.  COMPARISON:  03/28/2013  FINDINGS: The ventricles, cisterns and other CSF spaces are within normal. There is minimal chronic ischemic microvascular disease present. There is no mass, mass effect, shift of midline structures or acute hemorrhage. There is no evidence to suggest acute infarction. Remainder of the exam is unremarkable.  IMPRESSION: No acute intracranial findings.  Chronic ischemic microvascular disease.   Electronically Signed   By: Elberta Fortis M.D.   On: 07/05/2013 13:50    MEDICATIONS                                                                                                                       I have reviewed the patient's current medications.  ASSESSMENT/PLAN:                                                                                                           Recurrent GTC SE. Load with dilantin 1 gram now and continue dilantin 100 mg IV BID. 2 mg IV  ativan given. STAT CT brain. Called EEG tech for continuous EEG monitoring to ensure she is not having electrographic seizures. May need MRI brain if CT unremarkable. Will follow up.   Wyatt Portela, MD [facility] [phone]  07/06/2013, 1:09 PM

## 2013-07-06 NOTE — Progress Notes (Signed)
Nurse contacted MD to inform that pt was experiencing brief periods of apnea confirmed by both monitor and manual assessment.

## 2013-07-06 NOTE — Progress Notes (Signed)
Nurse witnessed second epidose; pt began speaking incomprehinisble words, facing starting twitching, saliva pooled in mouth, HR high 120's, RR high 30s, Oxygen sats 70's.  Episode lasted for approximately 1 minute.  Post episode pt regained calmness and non responsiveness.  MD was again contacted to inform of latest episode.

## 2013-07-06 NOTE — Progress Notes (Signed)
FMTS Attending Note Patient seen and examined by me, discussed with resident team and I agree with Dr Jarvis Newcomer' note.  Patient seen at time of seizure @1300 , brief period of apnea lasting approximately one minute before resuming breathing and coughing.  I have reviewed and agree with Dr Jarvis Newcomer' note describing this episode and concern for patient's ability to protect her airway. Appreciate CCM evaluation regarding airway protection.  Receiving dose of Dilantin 1g.  Also on Keppra, Lamictal. Paula Compton, MD

## 2013-07-06 NOTE — Discharge Summary (Signed)
Family Medicine Teaching [facility] 2 Discharge Summary  Patient name: [name] Medical record number: [id number] Date of birth: December 10, 1945 Age: 67 y.o. Gender: female Date of Admission: 07/05/2013  Date of Discharge: 07/16/2013 Admitting Physician: Barbaraann Barthel, MD  Primary Care Provider: No PCP Per Patient Consultants: Neurology, Critical care   Indication for Hospitalization: seizures  Discharge Diagnoses/Problem List:  Status epilepticus - resolved Seizure disorder Sepsis in the setting of UTI - resolved Right upper extremity DVT (setting of PICC line, to be removed 11/15) T2DM CHF - uncertain exact character HTN Anemia Schizophrenia Hypokalemia - resolved  Disposition: SNF  Discharge Condition: stable  Discharge Exam:  Temp: [97.8 F (36.6 C)-98.3 F (36.8 C)] 98.2 F (36.8 C) (11/16 0544)  Pulse Rate: [80-86] 86 (11/16 0544)  Resp: [16-19] 16 (11/16 0544)  BP: (117-132)/(68-89) 132/68 mmHg (11/16 0544)  SpO2: [100 %] 100 % (11/16 0544)  Physical Exam:  General: adult female, laying in bed in no acute distress  CV: rrr, no murmur, no JVD, no edema of lower extremities  Pulm: CTAB, normal WOB  Abd: soft, NT, ND, BS+  Neuro: alert and oriented to person and place. Not oriented to month or year  Psych: does not appear to be responding to internal stimuli, affect full Ext: right UE PICC has been removed, L forearm has IV  Brief Hospital Course:  [name] is a 67 y.o. female who presented on 11/6 with seizures and altered mental status. PMH is significant for seizure disorder, schizophrenia, CHF, HTN, diabetes, and Hepatitis A,B, and C. Please see below for course by date and problem.  11/6: Admitted for seizure, nonverbal. CT head showed no acute abnormality 11/7: CCM consulted for question of apneic events and desats with seizure, did not require ICU transfer at that point 11/8: Intermittent seizures continued despite vimpat, keppra, dilantin  though seizures did not always correspond with EEG. Also febrile to 102, blood cx, urine cx and CXR ordered. Concern for UTI-Levaquin started  11/10: Vanc started for possible ?staph UTI (Gram stain with GPC in clusters, NOT seen on culture)  11/10: E. coli UTI on culture, neurologist recommended intubatation for airway protection and propofol drip  11/11: 24h EEG with status epilepticus resolved 11/12: Extubated, stopped Vanc  11/13: SDU status in ICU for monitoring, no further seizure noted 11/14: Transfer to telemetry, right UE PICC DVT noted, started on Lovenox treatment dose  11/15: Improved, PICC to be removed, medically stable for discharge (awaiting SNF bed)  11/16: SNF bed available, d/c today  # Seizure: Resolved 11/11-12, extubated 11/12 as above - 4-drug management per neurology, transitioned to PO 11/14  - continue Vimpat 200 mg BID, Lamictal 150 mg qHS, Dilantin ER 100 mg TID, Keppra 1g BID  - PT/OT recommending SNF - bed offer at [facility] - needs continued PT & OT services there - would likely benefit from outpt neurology follow-up  # Schizophrenia:  - continued home klonopin PRN - recommend continued fluphenazine decanoate 25 mg q14 days (depo shot, due 11/17)  - recommend continued PRN fluphenazine 5 mg PO for breakthrough psychosis / agitation - could consider outpt psychiatry f/u  # Right upper extremity DVT - no known history of DVT, and likely provoked in setting of PICC line  - started Lovenox at treatment dose, plan to continue for 3 months  - discussed with pharmacy - not a good candidate for PO novel agent with multiple seizure meds and history of hepatitis   # Sepsis -  resolved: (clarification of documentation) secondary to urinary tract infection; initially considered to be SIRS but with identified nidus of infection, considered sepsis  - Urine culture with >100k E. Coli, pan sensitive  - treated with levaquin (11/15 is last dose); vanc for 1 day for Gram  positive cocci on stain but NOT seen on culture - Blood culture (11/08): neg   # T2DM: No home medications. Hb A1c: 6.7, controlled without medications  - would continue CBG checks intermittently, no plan to continue SSI at discharge  # Hypertension & CHF: Systolic hypertension at baseline; uncertain exact nature of heart failure, not on diuretic at baseline  - overall fluid status documented up 11L+ from admission, thought to be incomplete charted output. No significant pedal edema, SOB, or crackles on pulm exam - would recommend echo as outpatient   # Hypokalemia: repleted / resolved  # Hypomagnesemia: repleted  # Anemia: Hgb 11.2 >>> 11.3 Stable  # FEN/GI: Dysphagia 3 diet, meds with puree  Issues for Follow Up:  [name] was admitted with seizures and was treated for the same, and was treated for UTI. She is being discharged on Lamictal, Keppra, Dilantin, and Vimpat. She would likely benefit from neurology follow-up and lab monitoring at the discretion of providers at the SNF.  She has a history of schizophrenia, as well, and should continue to take fluphenazine 25 mg depo shots every 2 weeks (her next shot is due 11/17). Otherwise, she should continue her medications per her discharge summary and documentation.  SLP recommended dysphagia diet and meds with puree. PT/OT recommended SNF placement, and pt would benefit from continued PT/OT evaluation and treatment.  Pt does have a history of CHF without documented echocardiogram. This was not felt to be necessary to pursue in the inpatient setting as she has no obvious LE, SOB / crackles on lung exam, etc. Echo as an outpt would likely be prudent.  Significant Procedures:  Intubation 11/10 - 11/2 11/10 EEG - electrographic seizures that seem to emanate from the bifrontal regions and propagate to both cerebral hemispheres. Pt transferred to neuro ICU and intubated.  11/11 24 hour EEG - status epilepticus resolved  Significant Labs  and Imaging:   Recent Labs Lab 07/10/13 0545 07/11/13 0341 07/12/13 0515  WBC 5.1 5.4 5.4  HGB 10.3* 10.1* 11.3*  HCT 30.3* 29.9* 32.9*  PLT 128* 124* 148*    Recent Labs Lab 07/10/13 0545 07/11/13 0341 07/12/13 0515 07/13/13 0500 07/14/13 0605 07/16/13 0410  NA 132* 137 141 145 144 138  K 3.3* 3.2* 3.6 3.2* 3.7 3.9  CL 103 107 107 111 110 102  CO2 20 18* 17* 22 27 26   GLUCOSE 124* 129* 106* 130* 101* 143*  BUN 14 9 4* 4* 5* 10  CREATININE 0.41* 0.40* 0.37* 0.34* 0.35* 0.42*  CALCIUM 8.4 8.5 9.4 8.5 9.3 9.2  MG 1.4* 1.6 1.8  --   --   --   PHOS 2.2* 2.9 3.2  --   --   --   ALKPHOS  --   --   --   --   --  118*  AST  --   --   --   --   --  32  ALT  --   --   --   --   --  27  ALBUMIN  --   --   --   --   --  2.7*    UDS on admission - positive for benzo (  on chronic Klonopin PRN)  Micro: Urine culture 11/8 - pan-sensitive E.coli (NO gram positive organisms) Blood culture 11/8 - no growth to date  CT Head without contrast 11/8: FINDINGS:  No evidence of parenchymal hemorrhage or extra-axial fluid  collection. No mass lesion, mass effect, or midline shift.  No CT evidence of acute infarction.  Subcortical white matter and periventricular small vessel ischemic  changes.  Cerebral volume is within normal limits. No ventriculomegaly.  The visualized paranasal sinuses are essentially clear. The mastoid  air cells are unopacified.  No evidence of calvarial fracture.  IMPRESSION:  No evidence of acute intracranial abnormality.  Results/Tests Pending at Time of Discharge: final results of blood cultures 11/8  Discharge Medications:    Medication List    STOP taking these medications       traMADol 50 MG tablet  Commonly known as:  ULTRAM      TAKE these medications       acetaminophen 500 MG tablet  Commonly known as:  TYLENOL  Take 500 mg by mouth every 6 (six) hours as needed for pain.     aspirin EC 81 MG tablet  Take 81 mg by mouth daily.      B-complex with vitamin C tablet  Take 1 tablet by mouth daily.     calcium carbonate 500 MG chewable tablet  Commonly known as:  TUMS - dosed in mg elemental calcium  Chew 1 tablet by mouth 2 (two) times daily.     clonazePAM 0.5 MG tablet  Commonly known as:  KLONOPIN  Take 1 tablet (0.5 mg total) by mouth 2 (two) times daily as needed for anxiety.     docusate sodium 100 MG capsule  Commonly known as:  COLACE  Take 100 mg by mouth 2 (two) times daily.     enoxaparin 80 MG/0.8ML injection  Commonly known as:  LOVENOX  Inject 0.7 mLs (70 mg total) into the skin every 12 (twelve) hours.     fluPHENAZine 5 MG tablet  Commonly known as:  PROLIXIN  Take 5 mg by mouth every 6 (six) hours as needed (for breakthrough).     fluPHENAZine decanoate 25 MG/ML injection  Commonly known as:  PROLIXIN  Inject into the muscle every 14 (fourteen) days.     lacosamide 200 MG Tabs tablet  Commonly known as:  VIMPAT  Take 1 tablet (200 mg total) by mouth 2 (two) times daily.     lamoTRIgine 150 MG tablet  Commonly known as:  LAMICTAL  Take 150 mg by mouth at bedtime.     levETIRAcetam 1000 MG tablet  Commonly known as:  KEPPRA  Take 1 tablet (1,000 mg total) by mouth 2 (two) times daily.     magnesium oxide 400 MG tablet  Commonly known as:  MAG-OX  Take 400 mg by mouth daily.     metoprolol 50 MG tablet  Commonly known as:  LOPRESSOR  Take 25 mg by mouth 2 (two) times daily.     NOVOLOG FLEXPEN 100 UNIT/ML Sopn FlexPen  Generic drug:  insulin aspart  Inject 0-8 Units into the skin 3 (three) times daily with meals. Per blood sugar level sliding scale     phenytoin 100 MG ER capsule  Commonly known as:  DILANTIN  Take 1 capsule (100 mg total) by mouth 3 (three) times daily.     thiamine 100 MG tablet  Commonly known as:  VITAMIN B-1  Take 100 mg by mouth daily.  Discharge Instructions: Please refer to Patient Instructions section of EMR for full details.  Patient was  counseled important signs and symptoms that should prompt return to medical care, changes in medications, dietary instructions, activity restrictions, and follow up appointments. See also discharge instructions above.  Follow-Up Appointments: Follow-up Information   Follow up with Per SNF providers. (Follow up per SNF providers)       Jacquelin Hawking, MD 07/16/2013, 12:58 PM PGY-1, [facility] Family Medicine

## 2013-07-06 NOTE — Progress Notes (Signed)
Interim Progress Note:   Called to bedside by RN after pt had  Approximately 2 seconds of facial twitching concerning for seizure, followed by unresponsiveness. Upon arrival pt was unresponsive to voice and sternal rub with eyes open, somewhat deviated to the left and mouth open. Vital signs stable with HR 110s, BP 140s/90s, oxygen saturations >95% on room air. Ordered ativan 1mg  IV, oxygen by [place], suction prn for oral secretions.   Pt continued intermittent seizing with facial twitching, leftward gaze and head deviation, and arm twitching. Facial asymmetry noted. Neurology paged and arrived during ongoing evaluation, ordered additional 1mg  ativan, dilantin 1g, STAT Head CT, and continuous EEG. Pt had an apneic episode lasting approximately 60 sec. Oxygen saturations dropped to 60s, Oxygen was increased, then replaced by Non-rebreather. Saturations returned to 100%. Pt able to cough. Due to potential decompensation or failure to maintain airway, PCCM was asked to evaluate and is doing so now.   We will continue to evaluate and treat.   Lonney Revak B. Jarvis Newcomer, MD, PGY-1 07/06/2013 1:54 PM Family Medicine Teaching Service

## 2013-07-06 NOTE — Progress Notes (Signed)
Nurse was called into pt room by sitter.  Sitter described pt as having a seizure for approximately 2 seconds.  Upon assessment; mouth descended on left side, pt has eyes open but remains unresponsive.  BP immediately post episode 191/91, Oxygen sats high 90's, HR high 120's.  MD called and informed of episode.

## 2013-07-06 NOTE — Progress Notes (Signed)
Continuous LTM set up

## 2013-07-06 NOTE — Progress Notes (Signed)
Family Medicine Teaching Service Daily Progress Note Intern Pager: [phone]  Patient name: [name] Medical record number: [id number] Date of birth: [birth year] Age: 67 y.o. Gender: female  Primary Care Provider: No PCP Per Patient Consultants: Neurology Code Status: Full code assumed  Pt Overview and Major Events to Date:  11/6: Admitted for seizure   Assessment and Plan: [name] is a 67 y.o. female presenting with seizure . PMH is significant for seizure disorder, schizophrenia, CHF, HTN, diabetes, and Hepatitis A,B, and C.   # Seizure: pt with known seizure disorder, was seen in ER by neurology who made recs regarding seizure medications  - Phenytoin level pending - Seizure precautions - Keppra and lamictal per neuro, holding dilantin - IV ativan prn seizure activity - Monitor on telemetry; no EEG, MRI - Electrolytes, CT head wnl/non-acute; oral K repletion (3.4) - STOP ultram on discharge  # Diabetes: No home medications - HbA1c: pending, if >7 will start SSI  - Diabetic diet (passed swallow screen)  # Hypertension & CHF: continue home metoprolol, aspirin; hypertensive this AM. Will recommend echo as outpatient.  - Up ~2L since admission, stopping IVF  - monitor fluid status carefully; not overloaded on exam, CXR without edema  # Hypokalemia Mild, will supplement orally  # Psych: continue home klonopin prn, will need to reorder home fluphenazine decanoate if stays in hospital for any prolonged period of time   FEN/GI: diabetic diet, stopping IVF Prophylaxis: SCDs  Disposition: Transfer out of SDU  Subjective: She denies any pain, CP, SOB, N/V, dysuria. Sitter at bedside states she got sleep last night and had no acute events.   Objective: Temp:  [97.6 F (36.4 C)-100.1 F (37.8 C)] 98.9 F (37.2 C) (11/07 0409) Pulse Rate:  [105-144] 118 (11/07 0409) Resp:  [15-27] 22 (11/07 0409) BP: (100-195)/(59-170) 126/59 mmHg (11/07 0409) SpO2:  [88 %-100 %]  92 % (11/07 0409) Weight:  [143 lb 11.8 oz (65.2 kg)] 143 lb 11.8 oz (65.2 kg) (11/06 2000) Physical Exam: General: Pleasant 67 yo female in NAD Cardiovascular: RRR, no murmurs, no JVD Respiratory: Non-labored, saturating 96% on room air Abdomen: Soft, nontender to palpation, no organomegaly appreciated  Extremities: WWP, no edema, 2+ DP pulses in bilateral lower extremities  Neuro: Alert, oriented to person, city and state, not building. Thinks it's 1952. CN II-XII intact, responsive, not drowsy.   Laboratory:  Recent Labs Lab 07/05/13 1251 07/06/13 0547  WBC 4.8 4.5  HGB 12.9 12.0  HCT 39.0 36.2  PLT 210 215    Recent Labs Lab 07/05/13 1251  NA 138  K 4.5  CL 101  CO2 25  BUN 12  CREATININE 0.44*  CALCIUM 10.1  PROT 8.1  BILITOT 0.2*  ALKPHOS 169*  ALT 23  AST 30  GLUCOSE 108*   Urine micro few bacteria, 0-2 wbc  UDS + for benzos  Lactate 1.9  Trop neg  CBG 91   CT Head:  No acute intracranial findings.  Chronic ischemic microvascular disease.   CXR;  Similar right lung base pleural thickening and scarring without  superimposed acute cardiopulmonary process.  EKG: sinus tachycardia  Hazeline Junker, MD 07/06/2013, 7:30 AM PGY-1, [facility] Family Medicine FPTS Intern pager: [phone], text pages welcome

## 2013-07-07 ENCOUNTER — Inpatient Hospital Stay (HOSPITAL_COMMUNITY): Payer: Medicare Other

## 2013-07-07 LAB — URINALYSIS, ROUTINE W REFLEX MICROSCOPIC
Bilirubin Urine: NEGATIVE
Glucose, UA: NEGATIVE mg/dL
Ketones, ur: 15 mg/dL — AB
Nitrite: POSITIVE — AB
Specific Gravity, Urine: 1.02 (ref 1.005–1.030)
pH: 6 (ref 5.0–8.0)

## 2013-07-07 LAB — CBC WITH DIFFERENTIAL/PLATELET
Basophils Absolute: 0 10*3/uL (ref 0.0–0.1)
Eosinophils Absolute: 0 10*3/uL (ref 0.0–0.7)
Eosinophils Relative: 0 % (ref 0–5)
HCT: 33.5 % — ABNORMAL LOW (ref 36.0–46.0)
Lymphs Abs: 1.5 10*3/uL (ref 0.7–4.0)
MCH: 30.7 pg (ref 26.0–34.0)
Monocytes Absolute: 0.4 10*3/uL (ref 0.1–1.0)
Monocytes Relative: 8 % (ref 3–12)
Neutro Abs: 2.8 10*3/uL (ref 1.7–7.7)
Neutrophils Relative %: 60 % (ref 43–77)
Platelets: 222 10*3/uL (ref 150–400)
RBC: 3.71 MIL/uL — ABNORMAL LOW (ref 3.87–5.11)
RDW: 13.9 % (ref 11.5–15.5)

## 2013-07-07 LAB — URINE MICROSCOPIC-ADD ON

## 2013-07-07 LAB — COMPREHENSIVE METABOLIC PANEL
ALT: 20 U/L (ref 0–35)
AST: 30 U/L (ref 0–37)
Albumin: 3.5 g/dL (ref 3.5–5.2)
Alkaline Phosphatase: 138 U/L — ABNORMAL HIGH (ref 39–117)
CO2: 21 mEq/L (ref 19–32)
Calcium: 9.2 mg/dL (ref 8.4–10.5)
Creatinine, Ser: 0.46 mg/dL — ABNORMAL LOW (ref 0.50–1.10)
Glucose, Bld: 161 mg/dL — ABNORMAL HIGH (ref 70–99)
Sodium: 136 mEq/L (ref 135–145)
Total Protein: 7.4 g/dL (ref 6.0–8.3)

## 2013-07-07 LAB — GLUCOSE, CAPILLARY: Glucose-Capillary: 86 mg/dL (ref 70–99)

## 2013-07-07 LAB — PHENYTOIN LEVEL, TOTAL: Phenytoin Lvl: 14.9 ug/mL (ref 10.0–20.0)

## 2013-07-07 LAB — MRSA PCR SCREENING: MRSA by PCR: NEGATIVE

## 2013-07-07 LAB — GRAM STAIN

## 2013-07-07 MED ORDER — SODIUM CHLORIDE 0.9 % IV SOLN
INTRAVENOUS | Status: DC
  Administered 2013-07-07 – 2013-07-08 (×3): via INTRAVENOUS

## 2013-07-07 MED ORDER — LEVETIRACETAM 500 MG/5ML IV SOLN
750.0000 mg | Freq: Two times a day (BID) | INTRAVENOUS | Status: DC
  Administered 2013-07-07 – 2013-07-09 (×6): 750 mg via INTRAVENOUS
  Filled 2013-07-07 (×7): qty 7.5

## 2013-07-07 MED ORDER — SODIUM CHLORIDE 0.9 % IV SOLN
100.0000 mg | Freq: Two times a day (BID) | INTRAVENOUS | Status: DC
  Administered 2013-07-07 – 2013-07-11 (×8): 100 mg via INTRAVENOUS
  Filled 2013-07-07 (×16): qty 10

## 2013-07-07 MED ORDER — LABETALOL HCL 5 MG/ML IV SOLN
2.5000 mg | Freq: Once | INTRAVENOUS | Status: AC
  Administered 2013-07-07: 2.5 mg via INTRAVENOUS
  Filled 2013-07-07: qty 4

## 2013-07-07 MED ORDER — SODIUM CHLORIDE 0.9 % IV SOLN
200.0000 mg | Freq: Once | INTRAVENOUS | Status: AC
  Administered 2013-07-07: 200 mg via INTRAVENOUS
  Filled 2013-07-07: qty 20

## 2013-07-07 MED ORDER — SODIUM CHLORIDE 0.9 % IV SOLN: 200.0000 mg | Freq: Two times a day (BID) | INTRAVENOUS | Status: DC

## 2013-07-07 NOTE — Progress Notes (Signed)
Pt has fever (temp 102.4) even after tylenol suppository given; MD notified, no order received. Will continue to monitor patient.

## 2013-07-07 NOTE — Progress Notes (Signed)
Family Medicine Teaching Service Daily Progress Note Intern Pager: [phone]  Patient name: [name] Medical record number: [id number] Date of birth: 14-Dec-1945 Age: 67 y.o. Gender: female  Primary Care Provider: No PCP Per Patient Consultants: Neurology Code Status: Full code assumed  Pt Overview and Major Events to Date:  11/6: Admitted for seizure   Assessment and Plan: [name] is a 67 y.o. female presenting with seizure . PMH is significant for seizure disorder, schizophrenia, CHF, HTN, diabetes, and Hepatitis A,B, and C.   # Seizure: pt with known seizure disorder, was seen in ER by neurology who made recs regarding seizure medications. "Patient with continued seizure activity intermittently despite loading with Dilantin. Patient currently has not returned to her previous state of being able to follow commands. Intermittent clonic activity noted with eye deviation to the right. Head CT from 11/6 reviewed and shows no acute changes. EEG shows frequent sharp activity over the right hemisphere. This sharp activity does not always correspond to the clinical activity. Patient currently on Keppra and Dilantin. Unable to take Lamictal due to po status."  - Vimpat 200mg  IV now with maintenance to start at 100mg  IV q 12 hours ; Patient was re-evaluated ~ 3 hours after dose with notable seizure like activity described above. 2nd bolus Vimpat 200mg  IV given per neuro recs.  Maintenance scheduled this evening.   - Continue Dilantin and Keppra at current doses with Dilantin level to be repeated in AM  - Lamictal to be restarted once patient able to take po  - Phenytoin level pending - Seizure precautions - IV ativan prn seizure activity - Monitor on telemetry - STOP ultram on discharge - Appreciate Neurology consult and continued input. Patient to stay in SDU today. Awaiting EEG results.   # Diabetes: No home medications - HbA1c: pending, if >7 will start SSI  - Small Sips only.  Patient having difficulty with diet.   # Hypertension & CHF: continue home metoprolol, aspirin; hypertensive this AM. Will recommend echo as outpatient.  - monitor fluid status carefully; not overloaded on exam, CXR without edema - Up ~#3L since admission, stopped IVF  - Labetalol PRN orders for elevated pressures (2.5 mg only).   # Hypokalemia: resolved today will continue to monitor and supplement as necessary.   # Psych: continue home klonopin prn, will need to reorder home fluphenazine decanoate if stays in hospital for any prolonged period of time   FEN/GI: Sips only.  Prophylaxis: SCDs  Disposition: SDU until more clinically stable.   Subjective: Patient unable to verbalize with provider.   Objective: Temp:  [98.8 F (37.1 C)-100.3 F (37.9 C)] 99.3 F (37.4 C) (11/08 1104) Pulse Rate:  [94-136] 115 (11/08 1412) Resp:  [16-25] 21 (11/08 1412) BP: (121-182)/(54-83) 135/82 mmHg (11/08 1412) SpO2:  [97 %-100 %] 99 % (11/08 1412) Physical Exam: General: Pleasant 67 yo female in NAD Cardiovascular: RRR, no murmurs, no JVD Respiratory: Non-labored, saturating 98% on 2LNC Abdomen: Soft, nontender to palpation, no organomegaly appreciated  Extremities: WWP, no edema, 2+ DP pulses in bilateral lower extremities  Neuro: Alert, Patient awake and tracking me in room with eyes, with difficulty tracking left. Does not verbalize, other than grunting to questions. Exhibiting seizure-like activity in left arm and left foot, with grimace of mouth.   Laboratory:  Recent Labs Lab 07/05/13 1251 07/06/13 0547 07/07/13 1110  WBC 4.8 4.5 4.7  HGB 12.9 12.0 11.4*  HCT 39.0 36.2 33.5*  PLT 210 215 222  Recent Labs Lab 07/05/13 1251 07/06/13 0547 07/07/13 1110  NA 138 139 136  K 4.5 3.4* 4.1  CL 101 104 102  CO2 25 21 21   BUN 12 9 16   CREATININE 0.44* 0.48* 0.46*  CALCIUM 10.1 9.5 9.2  PROT 8.1  --  7.4  BILITOT 0.2*  --  0.2*  ALKPHOS 169*  --  138*  ALT 23  --  20  AST  30  --  30  GLUCOSE 108* 91 161*   Urine micro few bacteria, 0-2 wbc  UDS + for benzos  Lactate 1.9  Trop neg  CBG 91  Phenytoin lvl: 20.3 --> 15.9 today  CT Head:  No acute intracranial findings.  Chronic ischemic microvascular disease.   CXR;  Similar right lung base pleural thickening and scarring without  superimposed acute cardiopulmonary process.  EKG: sinus tachycardia  Natalia Leatherwood, DO 07/07/2013, 2:18 PM PGY-1, [facility] Family Medicine FPTS Intern pager: [phone], text pages welcome

## 2013-07-07 NOTE — Progress Notes (Deleted)
Notified pt of ongoing ams and inability to follow commands.  Pt is now NPO, and unable to take ordered PO evening meds.  Dr. Gwenlyn Saran ordered labetolol 5mg  iv to replace po metoprolol.  Will administer med as ordered and continued to monitor and provide care as pt needs dictate.

## 2013-07-07 NOTE — Progress Notes (Signed)
Notified MD of ongoing ams, pt is NPO due to inability to follow commands.  MD notified of this and notified that pt would not be able to take po meds.  IV labetolol 5mg  ordered to replace po metoprolol.  Will administer and continue to monitor pt and provide care and support as pt needs dictate.

## 2013-07-07 NOTE — Progress Notes (Signed)
FPTS Interim Note:  Was called by RN, Stormie, due to increased intermittent seizure activity.   Came to evaluate patient at bedside. She has intermittent periods of left arm and leg rhythmic movement. She is able to track during this time. Episode observed in the room lasted and paused for 15-30 secs before starting again. She is non verbal. She is alert. She doesn't follow commands.  Vitals are stable: Filed Vitals:   07/07/13 0000 07/07/13 0117 07/07/13 0118 07/07/13 0119  BP: 129/55 122/54  126/59  Pulse: 104 100 94 95  Temp: 100.3 F (37.9 C)     TempSrc: Axillary     Resp: 19 19 16 17   Height:      Weight:      SpO2: 98% 100% 99% 100%   A/P: 67 yo female with known seizure disorder who had episode of generalized tonic clonic seizure earlier this afternoon. She received load of 1gm of dilantin in addition to 2mg  of ativan earlier. She did not receive her lamictal tonight due to inability to swallow at this time.  She currently is on continuous EEG monitoring. Head CT was postponed in favor of EEG monitoring.  Dilantin level total: 20.3 - called Dr. Jodi Mourning with neurology who recommended giving dose of Keppra now. If patient not improved, will call her back and reassess.   Marena Chancy, PGY-3 Family Medicine Resident

## 2013-07-07 NOTE — Progress Notes (Signed)
FMTS Attending Note Patient seen and examined by me, discussed with resident team. Patient awake and tracking me in room with eyes.  Does not verbalize.  Not exhibiting seizure-like activity during my visit.  Appreciate Neurology consult and continued input. Patient to stay in SDU today.  Awaiting EEG results.  Paula Compton, MD

## 2013-07-07 NOTE — Progress Notes (Signed)
Discontinued LTM. 

## 2013-07-07 NOTE — Progress Notes (Signed)
Subjective: Called by attending service.  Patient with notable seizure activity during the night.  Post load Dilantin level therapeutic at 20.3.  Patient remains on maintenance.  IV Keppra dose moved up infrequency.  Although some improvement in EEG background activity noted contacted that patient continues to have intermittent shaking of the LUE.  Objective: Current vital signs: BP 139/72  Pulse 105  Temp(Src) 99.9 F (37.7 C) (Axillary)  Resp 18  Ht 5\' 5"  (1.651 m)  Wt 65.2 kg (143 lb 11.8 oz)  BMI 23.92 kg/m2  SpO2 98% Vital signs in last 24 hours: Temp:  [98.6 F (37 C)-100.3 F (37.9 C)] 99.9 F (37.7 C) (11/08 0400) Pulse Rate:  [85-136] 105 (11/08 0401) Resp:  [15-33] 18 (11/08 0401) BP: (122-191)/(54-93) 139/72 mmHg (11/08 0401) SpO2:  [94 %-100 %] 98 % (11/08 0401)  Intake/Output from previous day: 11/07 0701 - 11/08 0700 In: 338.5 [I.V.:231; IV Piggyback:107.5] Out: -  Intake/Output this shift: Total I/O In: 110.5 [I.V.:3; IV Piggyback:107.5] Out: -  Nutritional status: Carb Control  Neurologic Exam: Mental Status: Eyes open.  Patient does not respond to name being called.  Does not follow commands.  No speech noted although at times does appear to attempt speech nothing intelligible produced. Cranial Nerves: II: Discs flat bilaterally; Does not blink to bilateral confrontation, pupils equal, round, reactive to light and accommodation III,IV, VI: right eye deviation.  Patient at times does go to midline but does not cross midline to the left. V,VII: corneals intact VIII: unable to test IX,X: nottested XI: unable to test XII: unable to test Motor: Patient reflexly squeezes my hand bilaterally.  No movement against gravity noted.  Intermittent LUE shaking noted   Lab Results: Basic Metabolic Panel:  Recent Labs Lab 07/05/13 1251 07/06/13 0547  NA 138 139  K 4.5 3.4*  CL 101 104  CO2 25 21  GLUCOSE 108* 91  BUN 12 9  CREATININE 0.44* 0.48*   CALCIUM 10.1 9.5    Liver Function Tests:  Recent Labs Lab 07/05/13 1251  AST 30  ALT 23  ALKPHOS 169*  BILITOT 0.2*  PROT 8.1  ALBUMIN 4.0   No results found for this basename: LIPASE, AMYLASE,  in the last 168 hours No results found for this basename: AMMONIA,  in the last 168 hours  CBC:  Recent Labs Lab 07/05/13 1251 07/06/13 0547  WBC 4.8 4.5  NEUTROABS 2.9  --   HGB 12.9 12.0  HCT 39.0 36.2  MCV 93.1 91.0  PLT 210 215    Cardiac Enzymes:  Recent Labs Lab 07/05/13 1257  TROPONINI <0.30    Lipid Panel: No results found for this basename: CHOL, TRIG, HDL, CHOLHDL, VLDL, LDLCALC,  in the last 168 hours  CBG:  Recent Labs Lab 07/05/13 1254 07/07/13 0122 07/07/13 0420  GLUCAP 91 86 102*    Microbiology: Results for orders placed during the hospital encounter of 07/05/13  MRSA PCR SCREENING     Status: None   Collection Time    07/06/13 10:23 PM      Result Value Range Status   MRSA by PCR NEGATIVE  NEGATIVE Final   Comment:            The GeneXpert MRSA Assay (FDA     approved for NASAL specimens     only), is one component of a     comprehensive MRSA colonization     surveillance program. It is not     intended to diagnose  MRSA     infection nor to guide or     monitor treatment for     MRSA infections.    Coagulation Studies: No results found for this basename: LABPROT, INR,  in the last 72 hours  Imaging: Dg Chest 1 View  07/05/2013   CLINICAL DATA:  Altered mental status.  EXAM: CHEST - 1 VIEW  COMPARISON:  Chest radiograph March 28, 2013  FINDINGS: Cardiomediastinal silhouette is unremarkable and unchanged. Mild chronic interstitial changes with elevated right hemidiaphragm, similarly blunted right costophrenic angle with linear density. No pneumothorax. Pulmonary vasculature is unremarkable. Interval extubation.  Left costophrenic angle is incompletely imaged. Multiple EKG lines overlie the patient and may obscure subtle underlying  pathology. Included soft tissue planes and osseous structures are nonsuspicious.  IMPRESSION: Similar right lung base pleural thickening and scarring without superimposed acute cardiopulmonary process.   Electronically Signed   By: Awilda Metro   On: 07/05/2013 14:12   Ct Head Wo Contrast  07/05/2013   CLINICAL DATA:  Altered mental status.  EXAM: CT HEAD WITHOUT CONTRAST  TECHNIQUE: Contiguous axial images were obtained from the base of the skull through the vertex without intravenous contrast.  COMPARISON:  03/28/2013  FINDINGS: The ventricles, cisterns and other CSF spaces are within normal. There is minimal chronic ischemic microvascular disease present. There is no mass, mass effect, shift of midline structures or acute hemorrhage. There is no evidence to suggest acute infarction. Remainder of the exam is unremarkable.  IMPRESSION: No acute intracranial findings.  Chronic ischemic microvascular disease.   Electronically Signed   By: Elberta Fortis M.D.   On: 07/05/2013 13:50    Medications:  I have reviewed the patient's current medications. Scheduled: . aspirin EC  81 mg Oral Daily  . B-complex with vitamin C  1 tablet Oral Daily  . calcium carbonate  1 tablet Oral BID  . docusate sodium  100 mg Oral BID  . lacosamide (VIMPAT) IV  100 mg Intravenous Q12H  . lacosamide (VIMPAT) IV  200 mg Intravenous Once  . lamoTRIgine  150 mg Oral QHS  . levETIRAcetam  750 mg Intravenous Q12H  . magnesium oxide  400 mg Oral Daily  . metoprolol tartrate  25 mg Oral BID  . phenytoin (DILANTIN) IV  100 mg Intravenous Q8H  . potassium chloride  20 mEq Oral Daily  . sodium chloride  3 mL Intravenous Q12H  . thiamine  100 mg Oral Daily    Assessment/Plan: Patient with continued seizure activity intermittently despite loading with Dilantin.  Patient currently has not returned to her previous state of being able to follow commands.  Intermittent clonic activity noted with eye deviation to the right.   Head CT from 11/6 reviewed and shows no acute changes.  EEG shows frequent sharp activity over the right hemisphere.  This sharp activity does not always correspond to the clinical activity.  Patient currently on Keppra and Dilantin.  Unable to take Lamictal due to po status.    Recommendations: 1.  Vimpat 200mg  IV now with maintenance to start at 100mg  IV q 12 hours 2.  After load patient to be re-evaluated.  If continued clinical seizure activity would bolus Vimpat with another 200mg  IV.  Maintenance to be continued.   3.  Continue Dilantin and Keppra at current doses with Dilantin level to be repeated in AM 4.  Continue EEG monitoring 5.  Continue seizure precautions 6.  Lamictal to be restarted once patient able to take po  Case discussed with covering admitting team    LOS: 2 days   Thana Farr, MD [facility] [phone] 07/07/2013  5:53 AM

## 2013-07-07 NOTE — Evaluation (Signed)
Clinical/Bedside Swallow Evaluation Patient Details  Name: [name] MRN: [id number] Date of Birth: Mar 15, 1946  Today's Date: 07/07/2013 Time: 1450-1510 SLP Time Calculation (min): 20 min  Past Medical History:  Past Medical History  Diagnosis Date  . CHF (congestive heart failure)   . Seizures   . Schizophrenia   . Diabetes mellitus without complication   . Hypertension   . Asthma   . Hepatitis A   . Hepatitis B   . Hepatitis C   . Respiratory failure   . Smoker     1 pack daily   Past Surgical History: No past surgical history on file. HPI:  [name] is a 67 y.o. female, with a PMH significant for HTN, DM, chronic congestive heart failure, asthma, hepatitis A, hepatitis B, schizophrenia, heavy smoking, and seizures, brought to [facility] ED on 11/6 by medics due to recurrent GTC seizures. She lives at a facility and was last seen normal around 9 am on 11/6, and then found unresponsive at 1130 am. CT brain revealed no acute abnormality. Bedside Swallow Evaluation ordered to assess for safest p.o.   Assessment / Plan / Recommendation Clinical Impression  Bedside swallow evaluation completed.  Patient presents with grossly intact oral motor abilities when she initiates p.o. transit.  However, patient demonstrates right gaze preference with intermittent left-sided tremors with facial grimacing and lip pursing when this occurs with p.o. it results in oral holding, delayed swallow initiation and cough response that is greatest with thin liquids.  Given it is unknown when this may happen patient at a higher aspiration risk.  Recommending NPO short term in hopes of stabilization and increased safety with p.o. IV meds would be safest option at this time; however, if not an option recommend crushed in puree with multiple swallows when awake, alert and accepting of p.o.      Aspiration Risk  Severe    Diet Recommendation NPO   Medication Administration: Via alternative means (or  crushed in puree)    Other  Recommendations Oral Care Recommendations: Oral care Q4 per protocol   Follow Up Recommendations  Skilled Nursing facility;24 hour supervision/assistance    Frequency and Duration min 2x/week  2 weeks   Pertinent Vitals/Pain none    SLP Swallow Goals See Care Plan    Swallow Study Prior Functional Status  Unknown    General Date of Onset: 07/05/13 HPI: [name] is a 67 y.o. female, with a PMH significant for HTN, DM, chronic congestive heart failure, asthma, hepatitis A, hepatitis B, schizophrenia, heavy smoking, and seizures, brought to [facility] ED on 11/6 by medics due to recurrent GTC seizures. She lives at a facility and was last seen normal around 9 am on 11/6, and then found unresponsive at 1130 am. CT brain revealed no acute abnormality. Bedside Swallow Evaluation ordered to assess for safest p.o. Type of Study: Bedside swallow evaluation Previous Swallow Assessment: none on record Diet Prior to this Study: NPO Temperature Spikes Noted: No Respiratory Status: Nasal cannula History of Recent Intubation: No Behavior/Cognition: Lethargic;Requires cueing;Doesn't follow directions Oral Cavity - Dentition: Edentulous Self-Feeding Abilities: Total assist Patient Positioning: Upright in bed Baseline Vocal Quality: Aphonic Volitional Cough: Cognitively unable to elicit Volitional Swallow: Unable to elicit    Oral/Motor/Sensory Function Overall Oral Motor/Sensory Function: Appears within functional limits for tasks assessed (grossly WFL due to inability to follow commands)   Ice Chips Ice chips: Impaired Presentation: Spoon Oral Phase Impairments: Reduced labial seal;Reduced lingual movement/coordination;Poor awareness of bolus Oral  Phase Functional Implications: Prolonged oral transit Pharyngeal Phase Impairments: Suspected delayed Swallow   Thin Liquid Thin Liquid: Impaired Presentation: Cup;Straw Oral Phase Impairments: Reduced labial seal;Poor  awareness of bolus Oral Phase Functional Implications: Right anterior spillage;Oral holding Pharyngeal  Phase Impairments: Suspected delayed Swallow;Multiple swallows;Cough - Immediate    Nectar Thick Nectar Thick Liquid: Impaired Presentation: Cup;Spoon Oral Phase Impairments: Reduced labial seal;Poor awareness of bolus Oral phase functional implications: Right anterior spillage;Oral holding Pharyngeal Phase Impairments: Suspected delayed Swallow;Throat Clearing - Delayed Other Comments: nectar via tsp appeared most safe with occassional throat clear   Honey Thick Honey Thick Liquid: Not tested   Puree Puree: Impaired Presentation: Spoon Oral Phase Impairments: Reduced labial seal;Poor awareness of bolus Oral Phase Functional Implications: Oral holding Pharyngeal Phase Impairments: Multiple swallows   Solid   GO    Solid: Not tested      Fae Pippin, M.A., CCC-SLP [phone]  Tim Corriher 07/07/2013,4:37 PM

## 2013-07-07 NOTE — Progress Notes (Signed)
Dr. Mal Misty notified of Urine Gram Stain results WBCs Poly& Mono. Gram + cocci in clusters. Also notified to change administration route of medications. Pt NPO. Failed swallow study.

## 2013-07-07 NOTE — Progress Notes (Deleted)
MD was notified of ongoing ams and inability to follow commands.  Pt is now NPO and unable to take evening PO meds.  Dr. Gwenlyn Saran ordered iv Labetolol 5mg  to replace po metoprolol.  Will administer per md order and will continue to monitor pt and provide care as pt's needs dictate.

## 2013-07-08 LAB — CBC WITH DIFFERENTIAL/PLATELET
Basophils Absolute: 0 10*3/uL (ref 0.0–0.1)
Basophils Relative: 0 % (ref 0–1)
Eosinophils Absolute: 0 10*3/uL (ref 0.0–0.7)
MCH: 30.2 pg (ref 26.0–34.0)
MCHC: 33.3 g/dL (ref 30.0–36.0)
Neutro Abs: 4 10*3/uL (ref 1.7–7.7)
Neutrophils Relative %: 55 % (ref 43–77)
Platelets: 196 10*3/uL (ref 150–400)
RDW: 14.5 % (ref 11.5–15.5)

## 2013-07-08 LAB — BASIC METABOLIC PANEL
Chloride: 102 mEq/L (ref 96–112)
GFR calc Af Amer: >90 mL/min (ref >90)
GFR calc non Af Amer: >90 mL/min (ref >90)
Glucose, Bld: 159 mg/dL — ABNORMAL HIGH (ref 70–99)
Potassium: 3.6 mEq/L (ref 3.5–5.1)
Sodium: 137 mEq/L (ref 135–145)

## 2013-07-08 LAB — GLUCOSE, CAPILLARY
Glucose-Capillary: 126 mg/dL — ABNORMAL HIGH (ref 70–99)
Glucose-Capillary: 124 mg/dL — ABNORMAL HIGH (ref 70–99)

## 2013-07-08 MED ORDER — LABETALOL HCL 5 MG/ML IV SOLN
2.5000 mg | Freq: Once | INTRAVENOUS | Status: AC
  Administered 2013-07-08: 2.5 mg via INTRAVENOUS
  Filled 2013-07-08: qty 4

## 2013-07-08 MED ORDER — CHLORHEXIDINE GLUCONATE 0.12 % MT SOLN
15.0000 mL | Freq: Two times a day (BID) | OROMUCOSAL | Status: DC
  Administered 2013-07-08 – 2013-07-09 (×3): 15 mL via OROMUCOSAL
  Filled 2013-07-08 (×4): qty 15

## 2013-07-08 MED ORDER — SODIUM CHLORIDE 0.9 % IJ SOLN
10.0000 mL | Freq: Two times a day (BID) | INTRAMUSCULAR | Status: DC
  Administered 2013-07-08: 10 mL
  Administered 2013-07-09: 20 mL
  Administered 2013-07-10 – 2013-07-12 (×5): 10 mL

## 2013-07-08 MED ORDER — INFLUENZA VAC SPLIT QUAD 0.5 ML IM SUSP
0.5000 mL | INTRAMUSCULAR | Status: AC
  Administered 2013-07-09: 0.5 mL via INTRAMUSCULAR
  Filled 2013-07-08: qty 0.5

## 2013-07-08 MED ORDER — VANCOMYCIN HCL IN DEXTROSE 750-5 MG/150ML-% IV SOLN
750.0000 mg | Freq: Two times a day (BID) | INTRAVENOUS | Status: DC
  Administered 2013-07-08 – 2013-07-09 (×3): 750 mg via INTRAVENOUS
  Filled 2013-07-08 (×4): qty 150

## 2013-07-08 MED ORDER — BIOTENE DRY MOUTH MT LIQD
15.0000 mL | Freq: Two times a day (BID) | OROMUCOSAL | Status: DC
  Administered 2013-07-08: 15 mL via OROMUCOSAL

## 2013-07-08 MED ORDER — SODIUM CHLORIDE 0.9 % IJ SOLN
10.0000 mL | INTRAMUSCULAR | Status: DC | PRN
  Administered 2013-07-11: 10 mL

## 2013-07-08 MED ORDER — BIOTENE DRY MOUTH MT LIQD
15.0000 mL | Freq: Two times a day (BID) | OROMUCOSAL | Status: DC
  Administered 2013-07-08 – 2013-07-09 (×3): 15 mL via OROMUCOSAL

## 2013-07-08 MED ORDER — METOPROLOL TARTRATE 1 MG/ML IV SOLN
2.5000 mg | Freq: Four times a day (QID) | INTRAVENOUS | Status: DC
  Administered 2013-07-08 – 2013-07-12 (×10): 2.5 mg via INTRAVENOUS
  Filled 2013-07-08 (×20): qty 5

## 2013-07-08 MED ORDER — METOPROLOL TARTRATE 1 MG/ML IV SOLN: 5.0000 mg | Freq: Two times a day (BID) | INTRAVENOUS | Status: DC

## 2013-07-08 MED ORDER — LEVOFLOXACIN IN D5W 250 MG/50ML IV SOLN
250.0000 mg | INTRAVENOUS | Status: DC
  Administered 2013-07-08 – 2013-07-12 (×6): 250 mg via INTRAVENOUS
  Filled 2013-07-08 (×7): qty 50

## 2013-07-08 NOTE — Progress Notes (Signed)
NEURO HOSPITALIST PROGRESS NOTE   SUBJECTIVE:                                                                                                                        Lying in bed with open eyes but is not following commands. No further seizures reported. Continuous EEG showed no evidence of electrographic seizures. On  IV keppra, IV vimpat, and dilantin IV. Febrile with UTI  OBJECTIVE:                                                                                                                           Vital signs in last 24 hours: Temp:  [99.3 F (37.4 C)-102.4 F (39.1 C)] 99.8 F (37.7 C) (11/09 0800) Pulse Rate:  [107-179] 111 (11/09 0800) Resp:  [16-27] 22 (11/09 0800) BP: (101-197)/(33-133) 101/88 mmHg (11/09 0800) SpO2:  [91 %-100 %] 100 % (11/09 0800)  Intake/Output from previous day: 11/08 0701 - 11/09 0700 In: 2038 [P.O.:620; I.V.:1153; IV Piggyback:265] Out: -  Intake/Output this shift: Total I/O In: 50 [I.V.:50] Out: -  Nutritional status: NPO  Past Medical History  Diagnosis Date  . CHF (congestive heart failure)   . Seizures   . Schizophrenia   . Diabetes mellitus without complication   . Hypertension   . Asthma   . Hepatitis A   . Hepatitis B   . Hepatitis C   . Respiratory failure   . Smoker     1 pack daily    Neurologic Exam:  Mental status: open eyes but doesn't follows commands, no verbal output. Cranial Nerves:  II: Discs flat bilaterally; Does not blink to bilateral confrontation, pupils equal, round, reactive to light and accommodation  III,IV, VI: no gaze preference at this moment (which has been present before), no nystagmus. V,VII: corneals intact  VIII: unable to test  IX,X: nottested  XI: unable to test  XII: unable to test  Motor:  No spontaneous or pain induced movements noted. Sensory: does not react to pain, although she is awake  Lab Results: No results found for this basename:  cbc, bmp, coags, chol, tri, ldl, hga1c   Lipid Panel No results found for this basename: CHOL, TRIG, HDL, CHOLHDL, VLDL, LDLCALC,  in the last 72 hours  Studies/Results: Ct Head Wo Contrast  07/07/2013   CLINICAL DATA:  Seizures  EXAM: CT HEAD WITHOUT CONTRAST  TECHNIQUE: Contiguous axial images were obtained from the base of the skull through the vertex without intravenous contrast.  COMPARISON:  07/05/2013  FINDINGS: No evidence of parenchymal hemorrhage or extra-axial fluid collection. No mass lesion, mass effect, or midline shift.  No CT evidence of acute infarction.  Subcortical white matter and periventricular small vessel ischemic changes.  Cerebral volume is within normal limits.  No ventriculomegaly.  The visualized paranasal sinuses are essentially clear. The mastoid air cells are unopacified.  No evidence of calvarial fracture.  IMPRESSION: No evidence of acute intracranial abnormality.   Electronically Signed   By: Charline Bills M.D.   On: 07/07/2013 13:26   Dg Chest Port 1 View  07/08/2013   CLINICAL DATA:  Fevers  EXAM: PORTABLE CHEST - 1 VIEW  COMPARISON:  07/05/2013  FINDINGS: The heart size and mediastinal contours are within normal limits. Chronic interstitial coarsening noted. No airspace consolidation identified. The visualized skeletal structures are unremarkable.  IMPRESSION: No acute findings identified.   Electronically Signed   By: Signa Kell M.D.   On: 07/08/2013 01:41    MEDICATIONS                                                                                                                       I have reviewed the patient's current medications.  ASSESSMENT/PLAN:                                                                                                           GTC SE resolved, and no evidence of non convulsive SE on recently completed long term EEG monitoring. She is awake but doesn't move her extremities or follows any commands. Febrile with UTI: agree  with deferring LP at this moment. Continue current AEDs. Will follow up.  Wyatt Portela, MD [facility] [phone]  07/08/2013, 9:21 AM

## 2013-07-08 NOTE — Progress Notes (Signed)
Peripherally Inserted Central Catheter/Midline Placement  The IV Nurse has discussed with the patient and/or persons authorized to consent for the patient, the purpose of this procedure and the potential benefits and risks involved with this procedure.  The benefits include less needle sticks, lab draws from the catheter and patient may be discharged home with the catheter.  Risks include, but not limited to, infection, bleeding, blood clot (thrombus formation), and puncture of an artery; nerve damage and irregular heat beat.  Alternatives to this procedure were also discussed.  PICC/Midline Placement Documentation  PICC / Midline Double Lumen 07/08/13 PICC Right Basilic 44 cm 0 cm (Active)  Indication for Insertion or Continuance of Line Limited venous access - need for IV therapy >5 days (PICC only) 07/08/2013  6:25 PM  Exposed Catheter (cm) 0 cm 07/08/2013  6:25 PM  Site Assessment Clean;Dry;Intact 07/08/2013  6:25 PM  Lumen #1 Status Flushed;Saline locked;Blood return noted 07/08/2013  6:25 PM  Lumen #2 Status Flushed;Saline locked;Blood return noted 07/08/2013  6:25 PM  Dressing Type Transparent 07/08/2013  6:25 PM  Dressing Status Clean;Dry;Intact;Antimicrobial disc in place 07/08/2013  6:25 PM  Dressing Intervention New dressing 07/08/2013  6:25 PM  Dressing Change Due 07/15/13 07/08/2013  6:25 PM       Ethelda Chick 07/08/2013, 6:32 PM

## 2013-07-08 NOTE — Progress Notes (Signed)
Spoke with son and only child Chong Sicilian (# on facesheet) that lives out of state. He confirms that his mother makes her own decisions. He believes that she would want things done to help diagnose current problems and to provide care including picc line, enteric feeds, lumbar puncture if needed. He requests that we update him daily if possible as he lives out of state.

## 2013-07-08 NOTE — Progress Notes (Signed)
Family Medicine Teaching Service Daily Progress Note Intern Pager: [phone]  Patient name: [name] Medical record number: [id number] Date of birth: November 02, 1945 Age: 67 y.o. Gender: female  Primary Care Provider: No PCP Per Patient Consultants: Neurology Code Status: Full code assumed  Pt Overview and Major Events to Date:  11/6: Admitted for seizure  11/8: intermittent seizures continued despite vimpat, keppra, dilantin though seizures did not always correspond with EEG. Also febrile to 120, blood cx, urine cx and CXR ordered. Concern for UTI-Levaquin started 11/9: Vanc started for possible staph UTI  Assessment and Plan: [name] is a 67 y.o. female presenting with seizure . PMH is significant for seizure disorder, schizophrenia, CHF, HTN, diabetes, and Hepatitis A,B, and C.   # Seizure:  - Appreciate Neurology consult and continued input. Patient to stay in SDU today due to mental status and febrile.  -now on vimpat 100mg  BID, keppra 750mg  q12 and dilantin 100mg  q8 and therapeutic per neurology. Plan for Lamictal once can take PO.  -seizure activity has calmed since yesterday afternonn on this regimen. (Intermittent clonic activity on left side of body  noted with eye deviation to the right) though EEG activity does not always correspond with these bodily movements.  -despite calming of seizures, patient has not returned to previous state of being able to follow commands. -neurology suggests deferring LP for now in light of UTI/pyelo - Seizure precautions with tele and IV ativan prn seizure activity - STOP ultram on discharge - believe access in patient with multiple seizures and NPO is very important, will attempt to contact family and if ok will proceed with PICC line -other question is does patient need nutrition at this time (would likely need to be PANDA by IR to place deep enough to avoid aspiration)   # Febrile likely due to pyelonephritis -blood and urine cultures  ordered-following. UA and gram stain worrisome for UTI/pyelo given febrile. Possible staph.  -vanc/levaquin ordered. Concern for staph or enterococcus.  -per Dr. Claiborne Billings, since febrile after seizures and AMS before febrile, this was discussed with Dr. Thad Ranger last night and decision made not to pursue LP especially in light of likely UTI.   # Diabetes: No home medications. a1c 6.7.  -NPO. So will monitor TID to ensure no hypoglycemia  # Hypertension & CHF: normotensive though SBP near 100 this AM - aspirin (hold for now as no noted history CAD) -   Will recommend echo as outpatient (have not been able to get records of old echo) - IVF at 105 cc/hr to avoid overload - Metoprolol 2.5 mg q6 hours hold for SBP <110.   # Hypokalemia: check BMET. Has 20 potassium ordered but NPO.   # Psych: continue home klonopin prn, will need to reorder home fluphenazine decanoate if stays in hospital for any prolonged period of time   FEN/GI: NPO Prophylaxis: SCDs  Disposition: SDU until more clinically stable.   Subjective: Patient unable to verbalize with provider. Stares blankly  Objective: Temp:  [99.3 F (37.4 C)-102.4 F (39.1 C)] 99.8 F (37.7 C) (11/09 0800) Pulse Rate:  [107-179] 111 (11/09 0800) Resp:  [16-27] 22 (11/09 0800) BP: (101-197)/(33-133) 101/88 mmHg (11/09 0800) SpO2:  [91 %-100 %] 100 % (11/09 0800) Physical Exam: General: Pleasant 67 yo female in NAD Cardiovascular: RRR, no murmurs, no JVD HEENT: slightly dry mucus membranes Respiratory: Non-labored, saturating 98% on 2LNC Abdomen: Soft, nontender to palpation, no organomegaly appreciated  Extremities: WWP, no edema, 2+ DP pulses in  bilateral lower extremities, SCDs in place Neuro: Alert, Patient awake and tracking me in room with eyes, Does not verbalize, other than grunting to questions. Occasional slight twitch to left hand or foot.    Laboratory:  Recent Labs Lab 07/06/13 0547 07/07/13 1110 07/08/13 0520  WBC  4.5 4.7 7.2  HGB 12.0 11.4* 11.2*  HCT 36.2 33.5* 33.6*  PLT 215 222 196    Recent Labs Lab 07/05/13 1251 07/06/13 0547 07/07/13 1110  NA 138 139 136  K 4.5 3.4* 4.1  CL 101 104 102  CO2 25 21 21   BUN 12 9 16   CREATININE 0.44* 0.48* 0.46*  CALCIUM 10.1 9.5 9.2  PROT 8.1  --  7.4  BILITOT 0.2*  --  0.2*  ALKPHOS 169*  --  138*  ALT 23  --  20  AST 30  --  30  GLUCOSE 108* 91 161*   Urine micro few bacteria, 0-2 wbc  UDS + for benzos  Lactate 1.9  Trop neg  CBG 91  Phenytoin lvl: 20.3 --> 15.9 today  CT Head:  No acute intracranial findings.  Chronic ischemic microvascular disease.   CXR;  Similar right lung base pleural thickening and scarring without  superimposed acute cardiopulmonary process.  EKG: sinus tachycardia  [name] Majestic, MD 07/08/2013, 8:42 AM PGY-3, [facility] FPTS Intern pager: [phone], text pages welcome

## 2013-07-08 NOTE — Progress Notes (Signed)
FMTS Attending NOte Patient seen and examined by me, discussed with Dr Durene Cal and I agree with his assessment and plan. Please my separate note for further details.  Paula Compton, MD

## 2013-07-08 NOTE — Progress Notes (Signed)
FMTS Attending Note Patient seen and examined by me, discussed overnight with Dr Claiborne Billings (resident on-call).  Patient continues to spike fevers and remains tachycardic.  She has not had seizure activity overnight according to her RN and bedside aide. She is alert and awake, does not appear in distress.  COR: S1S2, mild tachycardia.  Unable to appreciate murmurs. PULM Poor inspiratory effort.  ABD soft. She has a foley catheter in place, recently placed overnight by RN.  Did not have a foley previously.  Yellow clear urine. Gram stain urine: GPC in clusters; no result yet on blood or urine cx.  CXR without infiltrate overnight.  A/P: Patient now with fevers and persistent tachycardia.  UA and Urine Gram stain suspicious for urinary source.  Patient placed on IV Levaquin overnight; the GPC in CLUSTERS raises concern for Staph (Enteroccocus usually in chains).  Would add Vancomycin to her regimen this morning, await culture results.  If Staph is isolated from blood culture, then for ECHO to evaluate for vegetation. Per RN, patient has tenuous IV access, may benefit from PICC line or central line. Remains without seizure activity this morning; appreciate Neurology input regarding AED management. Paula Compton, MD

## 2013-07-08 NOTE — Consult Note (Signed)
ANTIBIOTIC CONSULT NOTE - INITIAL  Pharmacy Consult for Vancomycin Indication: UTI  Allergies  Allergen Reactions  . Depakote [Valproic Acid]   . Haldol [Haloperidol Lactate]   . Penicillins   . Shellfish Allergy   . Trileptal [Oxcarbazepine]     Patient Measurements: Height: 5\' 5"  (165.1 cm) Weight: 143 lb 11.8 oz (65.2 kg) IBW/kg (Calculated) : 57  Vital Signs: Temp: 100.7 F (38.2 C) (11/09 0400) Temp src: Oral (11/09 0400) BP: 149/114 mmHg (11/09 0700) Pulse Rate: 112 (11/09 0700) Intake/Output from previous day: 11/08 0701 - 11/09 0700 In: 1830.5 [P.O.:620; I.V.:1103; IV Piggyback:107.5] Out: -  Intake/Output from this shift:    Labs:  Recent Labs  07/05/13 1251 07/06/13 0547 07/07/13 1110 07/08/13 0520  WBC 4.8 4.5 4.7 7.2  HGB 12.9 12.0 11.4* 11.2*  PLT 210 215 222 196  CREATININE 0.44* 0.48* 0.46*  --    Estimated Creatinine Clearance: 63.1 ml/min (by C-G formula based on Cr of 0.46).  Microbiology: Recent Results (from the past 720 hour(s))  MRSA PCR SCREENING     Status: None   Collection Time    07/06/13 10:23 PM      Result Value Range Status   MRSA by PCR NEGATIVE  NEGATIVE Final   Comment:            The GeneXpert MRSA Assay (FDA     approved for NASAL specimens     only), is one component of a     comprehensive MRSA colonization     surveillance program. It is not     intended to diagnose MRSA     infection nor to guide or     monitor treatment for     MRSA infections.  GRAM STAIN     Status: None   Collection Time    07/07/13  8:10 PM      Result Value Range Status   Specimen Description URINE, CATHETERIZED   Final   Special Requests NONE   Final   Gram Stain     Final   Value: CYTO URINE     WBC PRESENT,BOTH PMN AND MONONUCLEAR     GRAM POSITIVE COCCI IN CLUSTERS     Gram Stain Report Called to,Read Back By and Verified With:     OUSTERMAN R,RN 2130 07/07/13 SCALES H   Report Status 07/07/2013 FINAL   Final    Medical  History: Past Medical History  Diagnosis Date  . CHF (congestive heart failure)   . Seizures   . Schizophrenia   . Diabetes mellitus without complication   . Hypertension   . Asthma   . Hepatitis A   . Hepatitis B   . Hepatitis C   . Respiratory failure   . Smoker     1 pack daily   Assessment: 67yom started on IV levaquin early this morning for fever now with her urine gram stain + for GPC in clusters. She will begin vancomycin. Urine/blood cultures pending. Renal function is stable with sCr 0.46 and CrCl 47ml/min.  Goal of Therapy:  Vancomycin trough level 10-15 mcg/ml  Plan:  1) Vancomycin 750mg  IV q12 2) Follow renal function, cultures, trough at steady state   Fredrik Rigger 07/08/2013,8:05 AM

## 2013-07-09 ENCOUNTER — Inpatient Hospital Stay (HOSPITAL_COMMUNITY): Payer: Medicare Other

## 2013-07-09 DIAGNOSIS — G40401 Other generalized epilepsy and epileptic syndromes, not intractable, with status epilepticus: Principal | ICD-10-CM

## 2013-07-09 DIAGNOSIS — J9600 Acute respiratory failure, unspecified whether with hypoxia or hypercapnia: Secondary | ICD-10-CM

## 2013-07-09 LAB — BLOOD GAS, ARTERIAL
Acid-base deficit: 2.1 mmol/L — ABNORMAL HIGH (ref 0.0–2.0)
Bicarbonate: 21 mEq/L (ref 20.0–24.0)
Drawn by: 35235
FIO2: 0.6 %
MECHVT: 500 mL
O2 Saturation: 99.8 %
Patient temperature: 98.6
RATE: 16 resp/min
pCO2 arterial: 29 mmHg — ABNORMAL LOW (ref 35.0–45.0)

## 2013-07-09 LAB — BASIC METABOLIC PANEL
CO2: 21 mEq/L (ref 19–32)
Calcium: 8 mg/dL — ABNORMAL LOW (ref 8.4–10.5)
Creatinine, Ser: 0.35 mg/dL — ABNORMAL LOW (ref 0.50–1.10)
GFR calc Af Amer: >90 mL/min (ref >90)
GFR calc Af Amer: >90 mL/min (ref >90)
GFR calc non Af Amer: >90 mL/min (ref >90)
Glucose, Bld: 109 mg/dL — ABNORMAL HIGH (ref 70–99)
Potassium: 2.7 mEq/L — CL (ref 3.5–5.1)
Potassium: 4.1 mEq/L (ref 3.5–5.1)
Sodium: 136 mEq/L (ref 135–145)
Sodium: 134 mEq/L — ABNORMAL LOW (ref 135–145)

## 2013-07-09 LAB — MAGNESIUM
Magnesium: 1.4 mg/dL — ABNORMAL LOW (ref 1.5–2.5)
Magnesium: 1.6 mg/dL (ref 1.5–2.5)

## 2013-07-09 LAB — CBC
Hemoglobin: 10.7 g/dL — ABNORMAL LOW (ref 12.0–15.0)
MCV: 90.4 fL (ref 78.0–100.0)
Platelets: 160 10*3/uL (ref 150–400)
RBC: 3.54 MIL/uL — ABNORMAL LOW (ref 3.87–5.11)
WBC: 5.5 10*3/uL (ref 4.0–10.5)

## 2013-07-09 LAB — GLUCOSE, CAPILLARY: Glucose-Capillary: 112 mg/dL — ABNORMAL HIGH (ref 70–99)

## 2013-07-09 MED ORDER — DOCUSATE SODIUM 50 MG/5ML PO LIQD
100.0000 mg | Freq: Two times a day (BID) | ORAL | Status: DC
  Administered 2013-07-09 – 2013-07-14 (×7): 100 mg
  Filled 2013-07-09 (×15): qty 10

## 2013-07-09 MED ORDER — ROCURONIUM BROMIDE 50 MG/5ML IV SOLN
40.0000 mg | Freq: Once | INTRAVENOUS | Status: AC
  Administered 2013-07-09: 40 mg via INTRAVENOUS

## 2013-07-09 MED ORDER — KCL IN DEXTROSE-NACL 20-5-0.9 MEQ/L-%-% IV SOLN
INTRAVENOUS | Status: DC
  Administered 2013-07-09: 16:00:00 via INTRAVENOUS
  Filled 2013-07-09 (×9): qty 1000

## 2013-07-09 MED ORDER — ETOMIDATE 2 MG/ML IV SOLN
10.0000 mg | Freq: Once | INTRAVENOUS | Status: AC
  Administered 2013-07-09: 10 mg via INTRAVENOUS

## 2013-07-09 MED ORDER — MIDAZOLAM HCL 2 MG/2ML IJ SOLN
2.0000 mg | Freq: Once | INTRAMUSCULAR | Status: AC
  Administered 2013-07-09: 2 mg via INTRAVENOUS

## 2013-07-09 MED ORDER — DEXTROSE-NACL 5-0.45 % IV SOLN
INTRAVENOUS | Status: DC
  Administered 2013-07-09: 11:00:00 via INTRAVENOUS

## 2013-07-09 MED ORDER — PROPOFOL 10 MG/ML IV EMUL
5.0000 ug/kg/min | INTRAVENOUS | Status: DC
  Administered 2013-07-09: 20 ug/kg/min via INTRAVENOUS
  Administered 2013-07-10: 10 ug/kg/min via INTRAVENOUS
  Administered 2013-07-10: 15 ug/kg/min via INTRAVENOUS
  Administered 2013-07-11: 25 ug/kg/min via INTRAVENOUS
  Filled 2013-07-09 (×6): qty 100

## 2013-07-09 MED ORDER — PANTOPRAZOLE SODIUM 40 MG IV SOLR
40.0000 mg | INTRAVENOUS | Status: DC
  Administered 2013-07-09 – 2013-07-12 (×4): 40 mg via INTRAVENOUS
  Filled 2013-07-09 (×6): qty 40

## 2013-07-09 MED ORDER — SODIUM CHLORIDE 0.9 % IV SOLN
1000.0000 mg | Freq: Two times a day (BID) | INTRAVENOUS | Status: DC
  Administered 2013-07-09 – 2013-07-13 (×8): 1000 mg via INTRAVENOUS
  Filled 2013-07-09 (×9): qty 10

## 2013-07-09 MED ORDER — SODIUM CHLORIDE 0.9 % IV SOLN
25.0000 ug/h | INTRAVENOUS | Status: DC
  Administered 2013-07-09: 50 ug/h via INTRAVENOUS
  Filled 2013-07-09: qty 50

## 2013-07-09 MED ORDER — CHLORHEXIDINE GLUCONATE 0.12 % MT SOLN
15.0000 mL | Freq: Two times a day (BID) | OROMUCOSAL | Status: DC
  Administered 2013-07-10 – 2013-07-12 (×5): 15 mL via OROMUCOSAL
  Filled 2013-07-09 (×5): qty 15

## 2013-07-09 MED ORDER — DEXTROSE-NACL 5-0.9 % IV SOLN: INTRAVENOUS | Status: DC

## 2013-07-09 MED ORDER — VANCOMYCIN HCL IN DEXTROSE 750-5 MG/150ML-% IV SOLN
750.0000 mg | Freq: Two times a day (BID) | INTRAVENOUS | Status: DC
  Administered 2013-07-09 – 2013-07-10 (×3): 750 mg via INTRAVENOUS
  Filled 2013-07-09 (×5): qty 150

## 2013-07-09 MED ORDER — FENTANYL CITRATE 0.05 MG/ML IJ SOLN
100.0000 ug | Freq: Once | INTRAMUSCULAR | Status: AC
  Administered 2013-07-09: 100 ug via INTRAVENOUS

## 2013-07-09 MED ORDER — POTASSIUM CHLORIDE 10 MEQ/100ML IV SOLN
10.0000 meq | INTRAVENOUS | Status: AC
  Administered 2013-07-09 (×5): 10 meq via INTRAVENOUS
  Filled 2013-07-09 (×3): qty 100

## 2013-07-09 MED ORDER — BIOTENE DRY MOUTH MT LIQD
15.0000 mL | Freq: Four times a day (QID) | OROMUCOSAL | Status: DC
  Administered 2013-07-10 – 2013-07-12 (×11): 15 mL via OROMUCOSAL

## 2013-07-09 MED ORDER — MAGNESIUM SULFATE 40 MG/ML IJ SOLN
2.0000 g | Freq: Once | INTRAMUSCULAR | Status: DC
  Filled 2013-07-09: qty 50

## 2013-07-09 MED ORDER — SODIUM CHLORIDE 0.9 % IV SOLN
200.0000 mg | Freq: Two times a day (BID) | INTRAVENOUS | Status: DC
  Administered 2013-07-09 – 2013-07-13 (×7): 200 mg via INTRAVENOUS
  Filled 2013-07-09 (×15): qty 20

## 2013-07-09 MED ORDER — SODIUM CHLORIDE 0.9 % IV SOLN
INTRAVENOUS | Status: DC
  Administered 2013-07-09 – 2013-07-10 (×2): via INTRAVENOUS

## 2013-07-09 MED ORDER — SODIUM CHLORIDE 0.9 % IV BOLUS (SEPSIS)
500.0000 mL | Freq: Once | INTRAVENOUS | Status: AC
  Administered 2013-07-10: 500 mL via INTRAVENOUS

## 2013-07-09 NOTE — Consult Note (Signed)
ANTIBIOTIC CONSULT NOTE   Pharmacy Consult for Vancomycin Indication: Meningitis  Allergies  Allergen Reactions  . Depakote [Valproic Acid]   . Haldol [Haloperidol Lactate]   . Penicillins   . Shellfish Allergy   . Trileptal [Oxcarbazepine]     Patient Measurements: Height: 5\' 5"  (165.1 cm) Weight: 143 lb 15.4 oz (65.3 kg) IBW/kg (Calculated) : 57  Vital Signs: Temp: 99.6 F (37.6 C) (11/10 1123) Temp src: Axillary (11/10 1123) BP: 148/73 mmHg (11/10 1123) Pulse Rate: 89 (11/10 1123) Intake/Output from previous day: 11/09 0701 - 11/10 0700 In: 2100.8 [I.V.:1430.8; IV Piggyback:670] Out: 875 [Urine:875] Intake/Output from this shift: Total I/O In: 715 [I.V.:180; IV Piggyback:535] Out: 200 [Urine:200]  Labs:  Recent Labs  07/07/13 1110 07/08/13 0520 07/08/13 1030 07/09/13 0500  WBC 4.7 7.2  --  5.5  HGB 11.4* 11.2*  --  10.7*  PLT 222 196  --  160  CREATININE 0.46*  --  0.42* 0.35*   Estimated Creatinine Clearance: 63.1 ml/min (by C-G formula based on Cr of 0.35).  Microbiology: Recent Results (from the past 720 hour(s))  MRSA PCR SCREENING     Status: None   Collection Time    07/06/13 10:23 PM      Result Value Range Status   MRSA by PCR NEGATIVE  NEGATIVE Final   Comment:            The GeneXpert MRSA Assay (FDA     approved for NASAL specimens     only), is one component of a     comprehensive MRSA colonization     surveillance program. It is not     intended to diagnose MRSA     infection nor to guide or     monitor treatment for     MRSA infections.  URINE CULTURE     Status: None   Collection Time    07/07/13  8:10 PM      Result Value Range Status   Specimen Description URINE, CATHETERIZED   Final   Special Requests NONE   Final   Culture  Setup Time     Final   Value: 07/08/2013 06:16     Performed at [facility] Count     Final   Value: >=100,000 COLONIES/ML     Performed at [facility]   Culture      Final   Value: ESCHERICHIA COLI     Performed at [facility]   Report Status PENDING   Incomplete  GRAM STAIN     Status: None   Collection Time    07/07/13  8:10 PM      Result Value Range Status   Specimen Description URINE, CATHETERIZED   Final   Special Requests NONE   Final   Gram Stain     Final   Value: CYTO URINE     WBC PRESENT,BOTH PMN AND MONONUCLEAR     GRAM POSITIVE COCCI IN CLUSTERS     Gram Stain Report Called to,Read Back By and Verified With:     OUSTERMAN R,RN 2130 07/07/13 SCALES H   Report Status 07/07/2013 FINAL   Final  CULTURE, BLOOD (ROUTINE X 2)     Status: None   Collection Time    07/07/13  8:57 PM      Result Value Range Status   Specimen Description BLOOD LEFT HAND   Final   Special Requests BOTTLES DRAWN AEROBIC ONLY 5CC   Final  Culture  Setup Time     Final   Value: 07/08/2013 06:19     Performed at [facility]   Culture     Final   Value:        BLOOD CULTURE RECEIVED NO GROWTH TO DATE CULTURE WILL BE HELD FOR 5 DAYS BEFORE ISSUING A FINAL NEGATIVE REPORT     Performed at [facility]   Report Status PENDING   Incomplete  CULTURE, BLOOD (ROUTINE X 2)     Status: None   Collection Time    07/07/13  9:02 PM      Result Value Range Status   Specimen Description BLOOD LEFT FOREARM   Final   Special Requests BOTTLES DRAWN AEROBIC ONLY 5CC   Final   Culture  Setup Time     Final   Value: 07/08/2013 06:19     Performed at [facility]   Culture     Final   Value:        BLOOD CULTURE RECEIVED NO GROWTH TO DATE CULTURE WILL BE HELD FOR 5 DAYS BEFORE ISSUING A FINAL NEGATIVE REPORT     Performed at [facility]   Report Status PENDING   Incomplete   Assessment: 67yom started on IV levaquin11/9 for fever now with her urine gram stain + for GPC in clusters. Began on vancomycin. Urine grew e.coli, vancomycin was discontinued but now reordered to cover for concern for meningitis. No dosing has been missed.  Renal function unchanged from initial dosing.  Goal of Therapy:  Vancomycin trough level 15-20 mcg/ml  Plan:  1) Resume Vancomycin 750mg  IV q12 2) Follow renal function, cultures, trough at steady state   Sheppard Coil PharmD., BCPS Clinical Pharmacist Pager [phone] 07/09/2013 12:06 PM

## 2013-07-09 NOTE — Procedures (Signed)
Intubation Procedure Note [name] [id number] 04/19/1946  Procedure: Intubation Indications: Airway protection and maintenance  Procedure Details Consent: Unable to obtain consent because of altered level of consciousness. Time Out: Verified patient identification, verified procedure, site/side was marked, verified correct patient position, special equipment/implants available, medications/allergies/relevent history reviewed, required imaging and test results available.  Performed  Maximum sterile technique was used including gloves, hand hygiene and mask.  MAC and 3    Evaluation Hemodynamic Status: BP stable throughout; O2 sats: stable throughout Patient's Current Condition: stable Complications: No apparent complications Patient did tolerate procedure well. Chest X-ray ordered to verify placement.  CXR: pending.   Rutherford Guys, PA - S 07/09/2013  I was present and supervised procedure.  Patient seen and examined, agree with above note.  I dictated the care and orders written for this patient under my direction.  Alyson Reedy, MD [phone]

## 2013-07-09 NOTE — Progress Notes (Signed)
eLink Physician-Brief Progress Note Patient Name: [name] DOB: November 19, 1945 MRN: [id number]  Date of Service  07/09/2013   HPI/Events of Note     eICU Interventions  protonix for SUP   Intervention Category Intermediate Interventions: Best-practice therapies (e.g. DVT, beta blocker, etc.)  BYRUM,ROBERT S. 07/09/2013, 7:30 PM

## 2013-07-09 NOTE — Progress Notes (Signed)
EEG demonstrates electrographic seizures that seem to emanate from the bi frontal regions and propagate to both cerebral hemispheres. 2 mg IV ativan given without EEG improvement. She remains unresponsive with a gaze preference to the right. Give a loading dose keppra 1 gram and vimpat 200 mg IV. She is on dilantin 100 mg TID and requested STAT dilantin level. Called ICU consult: plan to intubate and start propofol. Continuous EEG monitoring. Will follow up.  Wyatt Portela, MD [facility].

## 2013-07-09 NOTE — Progress Notes (Signed)
Standard EEG changed to continuous per Dr Leroy Kennedy

## 2013-07-09 NOTE — Progress Notes (Signed)
PULMONARY  / CRITICAL CARE MEDICINE  Name: [name] MRN: [id number] DOB: [birth year]    ADMISSION DATE:  07/05/2013 CONSULTATION DATE:  07/06/2013  REFERRING MD :  Lelon Mast PRIMARY SERVICE: FMTS  CHIEF COMPLAINT:  Acute encephalopathy  BRIEF PATIENT DESCRIPTION: 67 yo [facility] resident admitted 11/6 with seizures.  On 11/7 patient had several seizures associated with brief apnea and fall in SpO2.  EEG 11/10 showed electrographic seizures that seem to emanate from the bifrontal regions and propagate to both cerebral hemispheres, pt transferred to neuro ICU and PCCM consulted for intubation.  SIGNIFICANT EVENTS / STUDIES:  11/6  Admitted with seizures 11/6  Head CT >>> nad 11/7  Seizures with apnea and desaturation.  PCCM consulted. 11/7  Continuous EEG >>> frequent sharp activity over the right hemisphere. This sharp activity does not always correspond to the clinical activity.  No evidence of electrographic seizures 11/8 Head CT >>> No evidence of acute intracranial abnormality 11/10 EEG >>> electrographic seizures that seem to emanate from the bifrontal regions and propagate to both cerebral hemispheres.  Pt transferred to neuro ICU and intubated. 11/10 MRI Brain >>>  LINES / TUBES: 11/9 PICC >>> 11/9 Foley>>> 11/10 ETT >>> 11/10 OGT >>>  CULTURES: Urine 11/8 >>> E.Coli Urine gram stain 11/8 >>> GPC in clusters Blood 11/8 >>>  ANTIBIOTICS: Levaquin 11/9 >>> Vanc 11/10 >>>  SUBJECTIVE:  Pt not responding or following commands.  Spoke with Dr. Georga Hacking with Neurology who is requesting that pt be intubated for airway protection and transferred to the neuro ICU.  VITAL SIGNS: Temp:  [99.4 F (37.4 C)-102.6 F (39.2 C)] 100.3 F (37.9 C) (11/10 1642) Pulse Rate:  [85-117] 113 (11/10 1642) Resp:  [14-28] 24 (11/10 1642) BP: (136-178)/(64-98) 148/64 mmHg (11/10 1642) SpO2:  [93 %-100 %] 100 % (11/10 1642) Weight:  [143 lb 15.4 oz (65.3 kg)] 143 lb 15.4 oz (65.3 kg) (11/10  0614)  HEMODYNAMICS:   VENTILATOR SETTINGS:   INTAKE / OUTPUT: Intake/Output     11/09 0701 - 11/10 0700 11/10 0701 - 11/11 0700   P.O.     I.V. (mL/kg) 1430.8 (21.9) 205 (3.1)   IV Piggyback 670 535   Total Intake(mL/kg) 2100.8 (32.2) 740 (11.3)   Urine (mL/kg/hr) 875 (0.6) 400 (0.6)   Total Output 875 400   Net +1225.8 +340        Urine Occurrence 450 x      PHYSICAL EXAMINATION: General:  Chronically ill appearing female, in bed, non-verbal. Neuro:  Does not answer questions or follow commands. HEENT:  [place]/AT. PERRL with right sided gaze.  MMM. Cardiovascular:  RRR, no M/R/G. Lungs:  Resps even and unlabored.  Coarse breath sounds throughout. Abdomen:  BS x 4.  Soft, NT/ND. Musculoskeletal:  No gross deformities.  No edema. Skin:  Warm and dry.   Recent Labs Lab 07/05/13 1251 07/05/13 1257 07/05/13 1356 07/06/13 0547 07/07/13 1110 07/08/13 0520 07/08/13 1030 07/09/13 0500 07/09/13 1250  HGB 12.9  --   --  12.0 11.4* 11.2*  --  10.7*  --   WBC 4.8  --   --  4.5 4.7 7.2  --  5.5  --   PLT 210  --   --  215 222 196  --  160  --   NA 138  --   --  139 136  --  137 136 134*  K 4.5  --   --  3.4* 4.1  --  3.6 2.7* 4.1  CL 101  --   --  104 102  --  102 103 103  CO2 25  --   --  21 21  --  21 21 20   GLUCOSE 108*  --   --  91 161*  --  159* 113* 109*  BUN 12  --   --  9 16  --  13 14 15   CREATININE 0.44*  --   --  0.48* 0.46*  --  0.42* 0.35* 0.37*  CALCIUM 10.1  --   --  9.5 9.2  --  8.9 8.0* 8.7  MG  --   --   --   --   --   --   --  1.4* 1.6  AST 30  --   --   --  30  --   --   --   --   ALT 23  --   --   --  20  --   --   --   --   ALKPHOS 169*  --   --   --  138*  --   --   --   --   BILITOT 0.2*  --   --   --  0.2*  --   --   --   --   PROT 8.1  --   --   --  7.4  --   --   --   --   ALBUMIN 4.0  --   --   --  3.5  --   --   --   --   LATICACIDVEN  --   --  1.90  --   --   --   --   --   --   TROPONINI  --  <0.30  --   --   --   --   --   --   --      Recent Labs Lab 07/08/13 2025 07/09/13 07/09/13 0402 07/09/13 0825 07/09/13 1122  GLUCAP 124* 127* 125* 112* 128*   CXR 11/10:  No acute findings.  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 - Intubated 11/10. - Full vent support. - VAP bundle. - F/u ABG @ 2000 - adjust vent settings accordingly. - Daily SBT/WUA. - F/u CXR in AM.  Sepsis - in the setting of UTI - positive UA/urine cultures. - Cont empiric vanc/levaquin. - F/u blood cultures. - Cont IVF's. - Maintain SBP > 90, UOP > 0.67ml/kg/hr. - Monitor WBC/fever curve.  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   - Propofol for sedation. - Cont continuous EEG. - Manage seizures per neuro recs. - Neuro following.  HTN, CHF - Per primary team.  DM - Per primary team  Rutherford Guys, PA - S  Will transfer to the ICU, intubate and mechanically ventilate, adjust vent for ABG, continuous sedation with propofol for seizure control and will f/u in AM.  CXR ordered for post intubation.  CC time 35 min.  Patient seen and examined, agree with above note.  I dictated the care and orders written for this patient under my direction.  Alyson Reedy, MD [phone]

## 2013-07-09 NOTE — Progress Notes (Addendum)
NEURO HOSPITALIST PROGRESS NOTE   SUBJECTIVE:                                                                                                                        Mental status unchanged. Intermittent tremor-like movements left foot noted. On vimpat, keppra, dilantin. Dilantin level 11/8 therapeutic 14.9 UTI. OBJECTIVE:                                                                                                                           Vital signs in last 24 hours: Temp:  [99.4 F (37.4 C)-102.8 F (39.3 C)] 99.4 F (37.4 C) (11/10 0406) Pulse Rate:  [86-117] 99 (11/10 0500) Resp:  [14-28] 16 (11/10 0500) BP: (145-178)/(65-98) 154/80 mmHg (11/10 0500) SpO2:  [93 %-100 %] 100 % (11/10 0500) Weight:  [65.3 kg (143 lb 15.4 oz)] 65.3 kg (143 lb 15.4 oz) (11/10 4782)  Intake/Output from previous day: 11/09 0701 - 11/10 0700 In: 2100.8 [I.V.:1430.8; IV Piggyback:670] Out: 875 [Urine:875] Intake/Output this shift:   Nutritional status: NPO  Past Medical History  Diagnosis Date  . CHF (congestive heart failure)   . Seizures   . Schizophrenia   . Diabetes mellitus without complication   . Hypertension   . Asthma   . Hepatitis A   . Hepatitis B   . Hepatitis C   . Respiratory failure   . Smoker     1 pack daily     Neurologic Exam:  Mental status: open eyes but doesn't follows commands, no verbal output.  Cranial Nerves:  II: Discs flat bilaterally; Does not blink to bilateral confrontation, pupils equal, round, reactive to light and accommodation  III,IV, VI: no gaze preference at this moment (which has been present before), no nystagmus.  V,VII: corneals intact  VIII: unable to test  IX,X: nottested  XI: unable to test  XII: unable to test  Motor:  No spontaneous or pain induced movements noted.  Sensory: does not react to pain, although she is awake Coordination and gait: unable to test No meningeal  signs.  Results:  No results found for this basename: cbc, bmp, coags, chol, tri, ldl, hga1c   Lipid Panel No results found for this basename: CHOL, TRIG,  HDL, CHOLHDL, VLDL, LDLCALC,  in the last 72 hours  Studies/Results: Ct Head Wo Contrast  07/07/2013   CLINICAL DATA:  Seizures  EXAM: CT HEAD WITHOUT CONTRAST  TECHNIQUE: Contiguous axial images were obtained from the base of the skull through the vertex without intravenous contrast.  COMPARISON:  07/05/2013  FINDINGS: No evidence of parenchymal hemorrhage or extra-axial fluid collection. No mass lesion, mass effect, or midline shift.  No CT evidence of acute infarction.  Subcortical white matter and periventricular small vessel ischemic changes.  Cerebral volume is within normal limits.  No ventriculomegaly.  The visualized paranasal sinuses are essentially clear. The mastoid air cells are unopacified.  No evidence of calvarial fracture.  IMPRESSION: No evidence of acute intracranial abnormality.   Electronically Signed   By: Charline Bills M.D.   On: 07/07/2013 13:26   Dg Chest Port 1 View  07/08/2013   CLINICAL DATA:  Fevers  EXAM: PORTABLE CHEST - 1 VIEW  COMPARISON:  07/05/2013  FINDINGS: The heart size and mediastinal contours are within normal limits. Chronic interstitial coarsening noted. No airspace consolidation identified. The visualized skeletal structures are unremarkable.  IMPRESSION: No acute findings identified.   Electronically Signed   By: Signa Kell M.D.   On: 07/08/2013 01:41    MEDICATIONS                                                                                                                       I have reviewed the patient's current medications.  ASSESSMENT/PLAN:                                                                                                            Resolved GTC SE but persistently altered mental status. Recommend: EEG. MRI. LP if those test are unremarkable.  Wyatt Portela,  MD [facility] [phone]  07/09/2013, 8:11 AM

## 2013-07-09 NOTE — Clinical Social Work Note (Addendum)
CSW alerted by [facility] that pt is from [facility] in [place]. Phone number for facility is [phone] and pt's son Hollice Espy phone number is [phone]. CSW will complete assessment tomorrow and verify pt is from this facility and can return when ready for discharge.   Maryclare Labrador, MSW, [facility] Clinical Social Worker [phone]

## 2013-07-09 NOTE — Progress Notes (Signed)
Family Medicine Teaching Service Daily Progress Note Intern Pager: [phone]  Patient name: [name] Medical record number: [id number] Date of birth: 20-Dec-1945 Age: 67 y.o. Gender: female  Primary Care Provider: No PCP Per Patient Consultants: Neurology Code Status: Full code assumed  Pt Overview and Major Events to Date:  11/6: Admitted for seizure  11/8: intermittent seizures continued despite vimpat, keppra, dilantin though seizures did not always correspond with EEG. Also febrile to 120, blood cx, urine cx and CXR ordered. Concern for UTI-Levaquin started 11/9: Vanc started for possible staph UTI 11/10: To get MRI, EEG. E. coli UTI on culture.   Assessment and Plan: [name] is a 67 y.o. female presenting with seizure . PMH is significant for seizure disorder, schizophrenia, CHF, HTN, diabetes, and Hepatitis A,B, and C.   # Seizure: Resolved, still with minimal twitching of left foot, still with AMS from baseline.  - Appreciate neurology consultation.   MRI  EEG  LP if those do not explain her AMS - Vimpat 100mg  BID, keppra 750mg  q12h, and dilantin 100mg  q8h and therapeutic per neurology. Plan for Lamictal once can take PO.  - Intermittent clonic activity on left side of body noted with eye deviation to the right though EEG has not always corresponded with these bodily movements.  - Seizure precautions with tele and ativan 1mg  IV prn seizure activity - Now with PICC and Foley - Still NPO (would likely need to be PANDA by IR to place deep enough to avoid aspiration)   Nursing to attempt NGT placement  # SIRS: Fever, tachycardia, tachypnea. WBC 5.5 - Urine culture with >100k E. coli.  - Vancomycin, levaquin (11/9-) for MRSA coverage. Concern for staph or enterococcus.  - Seizures preceded fever and have presumptive source, but consider LP in other tests negative.  [ ]  Following Blood culture (11/08): NGTD  # Diabetes: No home medications. Hb A1c: 6.7.  - Monitor  TID to ensure no hypoglycemia while NPO  # Hypertension & CHF: Systolic hypertension with wide pulse pressures - aspirin (hold for now as no noted history CAD) - Will recommend echo as outpatient - IVF at 60 cc/hr to avoid overload - Metoprolol 2.5 mg q6 hours hold for SBP <110.   # Hypokalemia: 2.7 today: giving 6 runs and will recheck BMP to assess need for further repletion.  # Hypomagnesemia: 1.4. Repleting with 2g IVPB.  # Anemia: Hgb 11.2 > 10.7 Continue monitoring.  # Psych: Continue home klonopin prn, home fluphenazine decanoate due to be given in >1 week.   FEN/GI: NPO, NS @ 60cc/hr Prophylaxis: SCDs  Disposition: SDU until more clinically stable.   Subjective: Patient is not interactive. No events overnight per RN.    Objective: Temp:  [99.8 F (37.7 C)-102.8 F (39.3 C)] 100.6 F (38.1 C) (11/10 0003) Pulse Rate:  [86-117] 117 (11/10 0003) Resp:  [18-28] 18 (11/10 0003) BP: (101-178)/(65-114) 178/98 mmHg (11/09 1900) SpO2:  [93 %-100 %] 98 % (11/10 0003) Physical Exam: General: 68 yo female in NAD Cardiovascular: RRR, no murmurs, no JVD HEENT: slightly dry mucus membranes Respiratory: Non-labored, saturating 98% on 2LNC, CTAB Abdomen: + BS, Soft, NT ND Extremities: WWP, no edema, 2+ DP pulses in bilateral lower extremities, SCDs in place Neuro: Alert, Patient awake and tracking me in room with eyes. Occasional slight twitch of left foot.   Laboratory:  Recent Labs Lab 07/06/13 0547 07/07/13 1110 07/08/13 0520  WBC 4.5 4.7 7.2  HGB 12.0 11.4* 11.2*  HCT  36.2 33.5* 33.6*  PLT 215 222 196    Recent Labs Lab 07/05/13 1251 07/06/13 0547 07/07/13 1110 07/08/13 1030  NA 138 139 136 137  K 4.5 3.4* 4.1 3.6  CL 101 104 102 102  CO2 25 21 21 21   BUN 12 9 16 13   CREATININE 0.44* 0.48* 0.46* 0.42*  CALCIUM 10.1 9.5 9.2 8.9  PROT 8.1  --  7.4  --   BILITOT 0.2*  --  0.2*  --   ALKPHOS 169*  --  138*  --   ALT 23  --  20  --   AST 30  --  30  --    GLUCOSE 108* 91 161* 159*   Urine micro few bacteria, 0-2 wbc  UDS + for benzodiazepines  Lactate 1.9  Trop neg  CBG 91  Phenytoin level: 20.3 > 15.9  CT Head:  No acute intracranial findings.  Chronic ischemic microvascular disease.   CXR;  Similar right lung base pleural thickening and scarring without  superimposed acute cardiopulmonary process.  EKG: sinus tachycardia  [name] Junker, MD 07/09/2013, 3:43 AM PGY-1, [facility] Family Medicine FPTS Intern pager: [phone], text pages welcome

## 2013-07-09 NOTE — Progress Notes (Signed)
Attending Addendum  I examined the patient and discussed the assessment and plan with Dr. Jarvis Newcomer. I have reviewed the note and agree.  This AM patient remains non verbal. Her neuro exam is non-focal. While she has a gaze preference to the R she does focus both eyes to the midline. PERRLA. She appears to still be in a post ictal state and is definitely altered from her baseline. She is afebrile since 11/9 at 1845. Agree with plan for EEG and MRI. Will continue both Vanc and Levaquin pending workup for increased seizure activity and mental status changes in the setting of fever. Noted that E. Coli grew out of urine culture (no staph).   Regarding nutrition, patient remains NPO. Added dextrose to fluids and potassium given recent hypokalemia. Also changed fluids to normal saline due to hyponatremia. If she must remain NPO due to mental status change the next step is NG tube placement for nutrition if this cannot be obtained then general surgery consult for peg tube placement.     Dessa Phi, MD FAMILY MEDICINE TEACHING SERVICE

## 2013-07-09 NOTE — Progress Notes (Signed)
eLink Physician-Brief Progress Note Patient Name: [name] DOB: Apr 24, 1946 MRN: [id number]  Date of Service  07/09/2013   HPI/Events of Note  Hypotension in the setting of heavy sedation for status epilepticus with current BP of 68/42.  Nurse has reduced sedation.     eICU Interventions  Plan: 500 cc bolus NS for BP support.  May need to consider pressors if BP remains low in order to continue sedation.   Intervention Category Intermediate Interventions: Hypotension - evaluation and management  Ari Bernabei 07/09/2013, 11:53 PM

## 2013-07-10 ENCOUNTER — Encounter (HOSPITAL_COMMUNITY): Payer: Self-pay | Admitting: *Deleted

## 2013-07-10 ENCOUNTER — Inpatient Hospital Stay (HOSPITAL_COMMUNITY): Payer: Medicare Other

## 2013-07-10 DIAGNOSIS — N390 Urinary tract infection, site not specified: Secondary | ICD-10-CM

## 2013-07-10 LAB — CBC
HCT: 30.3 % — ABNORMAL LOW (ref 36.0–46.0)
Hemoglobin: 10.3 g/dL — ABNORMAL LOW (ref 12.0–15.0)
MCH: 30.7 pg (ref 26.0–34.0)
MCHC: 34 g/dL (ref 30.0–36.0)
Platelets: 128 10*3/uL — ABNORMAL LOW (ref 150–400)
RDW: 14.2 % (ref 11.5–15.5)
WBC: 5.1 10*3/uL (ref 4.0–10.5)

## 2013-07-10 LAB — BLOOD GAS, ARTERIAL
Bicarbonate: 19.8 mEq/L — ABNORMAL LOW (ref 20.0–24.0)
Drawn by: 352351
FIO2: 0.3 %
MECHVT: 500 mL
PEEP: 5 cmH2O
Patient temperature: 98.6
pCO2 arterial: 30.1 mmHg — ABNORMAL LOW (ref 35.0–45.0)
pH, Arterial: 7.433 (ref 7.350–7.450)

## 2013-07-10 LAB — GLUCOSE, CAPILLARY
Glucose-Capillary: 125 mg/dL — ABNORMAL HIGH (ref 70–99)
Glucose-Capillary: 113 mg/dL — ABNORMAL HIGH (ref 70–99)
Glucose-Capillary: 124 mg/dL — ABNORMAL HIGH (ref 70–99)
Glucose-Capillary: 97 mg/dL (ref 70–99)
Glucose-Capillary: 97 mg/dL (ref 70–99)

## 2013-07-10 LAB — BASIC METABOLIC PANEL
BUN: 14 mg/dL (ref 6–23)
Calcium: 8.4 mg/dL (ref 8.4–10.5)
Chloride: 103 mEq/L (ref 96–112)
Creatinine, Ser: 0.41 mg/dL — ABNORMAL LOW (ref 0.50–1.10)
GFR calc Af Amer: >90 mL/min (ref >90)
GFR calc non Af Amer: >90 mL/min (ref >90)
Glucose, Bld: 124 mg/dL — ABNORMAL HIGH (ref 70–99)
Potassium: 3.3 mEq/L — ABNORMAL LOW (ref 3.5–5.1)

## 2013-07-10 LAB — PHENYTOIN LEVEL, TOTAL: Phenytoin Lvl: 11.5 ug/mL (ref 10.0–20.0)

## 2013-07-10 LAB — PHOSPHORUS: Phosphorus: 2.2 mg/dL — ABNORMAL LOW (ref 2.3–4.6)

## 2013-07-10 MED ORDER — MAGNESIUM SULFATE 40 MG/ML IJ SOLN
2.0000 g | Freq: Once | INTRAMUSCULAR | Status: AC
  Administered 2013-07-10: 2 g via INTRAVENOUS
  Filled 2013-07-10: qty 50

## 2013-07-10 MED ORDER — MAGNESIUM SULFATE 40 MG/ML IJ SOLN: 2.0000 g | Freq: Once | INTRAMUSCULAR | Status: DC

## 2013-07-10 MED ORDER — VITAL AF 1.2 CAL PO LIQD
1000.0000 mL | ORAL | Status: DC
  Administered 2013-07-10 – 2013-07-12 (×2): 1000 mL
  Filled 2013-07-10 (×5): qty 1000

## 2013-07-10 MED ORDER — SODIUM CHLORIDE 0.9 % IV SOLN
200.0000 mg | Freq: Once | INTRAVENOUS | Status: AC
  Administered 2013-07-10: 200 mg via INTRAVENOUS
  Filled 2013-07-10: qty 4

## 2013-07-10 MED ORDER — NOREPINEPHRINE BITARTRATE 1 MG/ML IJ SOLN
2.0000 ug/min | INTRAVENOUS | Status: DC
  Filled 2013-07-10: qty 4

## 2013-07-10 MED ORDER — DEXTROSE 5 % IV SOLN
30.0000 mmol | Freq: Once | INTRAVENOUS | Status: AC
  Administered 2013-07-10: 30 mmol via INTRAVENOUS
  Filled 2013-07-10: qty 10

## 2013-07-10 NOTE — Progress Notes (Signed)
Pt increasingly hypotensive with SBP mid 60's.  Pt's UOP also noted to be decreasing.  MD (Deterding) notified and will place order for IV fluid bolus.  Will continue to monitor.

## 2013-07-10 NOTE — Progress Notes (Signed)
INITIAL NUTRITION ASSESSMENT  DOCUMENTATION CODES Per approved criteria  -Not Applicable   INTERVENTION: 1.  Enteral nutrition; initiate Vital 1.2 @ 20 mL/hr continuous.  Advance by 10 mL q 4 hrs to 50 mL/hr goal to provide 1440 kcal, 90g protein, 984 mL free water.  Nutrition provision with TF + propofol = 1544 kcal (101% estimated need), 90g protein (100% estimated need), 984 mL free water  NUTRITION DIAGNOSIS: Inadequate oral intake related to inability to eat as evidenced by intubated, NPO.   Monitor:  1.  Enteral nutrition; initiation with tolerance.  Pt to meet >/=90% estimated needs with nutrition support.  2.  Wt/wt change; monitor trends  Reason for Assessment: Vent  67 y.o. female  Admitting Dx: Status epilepticus  ASSESSMENT: Pt admitted with AMS r/t to status epilepticus. Also developed fevers with pyelonephritis. Now with grand mal tonic clonic seizures and UTI. Pt assessed by SLP (11/8) who determined not appropriate for PO diet at the time.  Pt has been NPO since admission- 5 days.  Pt intubated (11/10) for safety due to acute respiratory failure.  RD consulted for initiation and management of enteral nutrition.   Patient is currently intubated on ventilator support.  MV: 7.3 L/min Temp:Temp (24hrs), Avg:98.6 F (37 C), Min:97.9 F (36.6 C), Max:100.3 F (37.9 C)  Propofol: 3.9 ml/hr providing 102 kcal/d. Tip of tube is in the distal stomach.   Nutrition Focused Physical Exam:  Subcutaneous Fat:  Orbital Region: WNL Upper Arm Region: WNL Thoracic and Lumbar Region: WNL  Muscle:  Temple Region: WNL Clavicle Bone Region: WNL Clavicle and Acromion Bone Region: WNL Scapular Bone Region: WNL Dorsal Hand: WNL Patellar Region: WNL Anterior Thigh Region: WNL Posterior Calf Region: WNL  Edema: none present  Height: Ht Readings from Last 1 Encounters:  07/09/13 5\' 5"  (1.651 m)    Weight: Wt Readings from Last 1 Encounters:  07/10/13 151 lb 0.2 oz  (68.5 kg)    Ideal Body Weight: 125 lbs  % Ideal Body Weight: 120%  Wt Readings from Last 10 Encounters:  07/10/13 151 lb 0.2 oz (68.5 kg)  04/02/13 149 lb 14.6 oz (68 kg)    Usual Body Weight: 150 lbs per chart review  % Usual Body Weight: 100%  BMI:  Body mass index is 25.13 kg/(m^2).  Estimated Nutritional Needs: Kcal: 1525 Protein: 90-105g Fluid: ~2.0 L/day  Skin: intact  Diet Order: NPO  EDUCATION NEEDS: -Education not appropriate at this time   Intake/Output Summary (Last 24 hours) at 07/10/13 1154 Last data filed at 07/10/13 1023  Gross per 24 hour  Intake 3372.1 ml  Output    895 ml  Net 2477.1 ml    Last BM: 11/7  Labs:   Recent Labs Lab 07/09/13 0500 07/09/13 1250 07/10/13 0545  NA 136 134* 132*  K 2.7* 4.1 3.3*  CL 103 103 103  CO2 21 20 20   BUN 14 15 14   CREATININE 0.35* 0.37* 0.41*  CALCIUM 8.0* 8.7 8.4  MG 1.4* 1.6 1.4*  PHOS  --   --  2.2*  GLUCOSE 113* 109* 124*    CBG (last 3)   Recent Labs  07/10/13 0002 07/10/13 0445 07/10/13 0744  GLUCAP 104* 125* 113*    Scheduled Meds: . antiseptic oral rinse  15 mL Mouth Rinse QID  . B-complex with vitamin C  1 tablet Oral Daily  . calcium carbonate  1 tablet Oral BID  . chlorhexidine  15 mL Mouth Rinse BID  . docusate  100 mg Per Tube BID  . lacosamide (VIMPAT) IV  100 mg Intravenous Q12H  . lacosamide (VIMPAT) IV  200 mg Intravenous Q12H  . lamoTRIgine  150 mg Oral QHS  . levETIRAcetam  1,000 mg Intravenous Q12H  . levofloxacin (LEVAQUIN) IV  250 mg Intravenous Q24H  . magnesium oxide  400 mg Oral Daily  . magnesium sulfate 1 - 4 g bolus IVPB  2 g Intravenous Once  . metoprolol  2.5 mg Intravenous Q6H  . pantoprazole (PROTONIX) IV  40 mg Intravenous Q24H  . phenytoin (DILANTIN) IV  100 mg Intravenous Q8H  . potassium phosphate IVPB (mmol)  30 mmol Intravenous Once  . sodium chloride  10-40 mL Intracatheter Q12H  . sodium chloride  3 mL Intravenous Q12H  . thiamine  100  mg Oral Daily  . vancomycin  750 mg Intravenous Q12H    Continuous Infusions: . sodium chloride 100 mL/hr at 07/10/13 0700  . dextrose 5 % and 0.9 % NaCl with KCl 20 mEq/L 100 mL/hr at 07/09/13 1545  . fentaNYL infusion INTRAVENOUS 25 mcg/hr (07/10/13 0700)  . norepinephrine (LEVOPHED) Adult infusion    . propofol 10 mcg/kg/min (07/10/13 1914)    Past Medical History  Diagnosis Date  . CHF (congestive heart failure)   . Seizures   . Schizophrenia   . Diabetes mellitus without complication   . Hypertension   . Asthma   . Hepatitis A   . Hepatitis B   . Hepatitis C   . Respiratory failure   . Smoker     1 pack daily    History reviewed. No pertinent past surgical history.  Loyce Dys, MS RD LDN Clinical Inpatient Dietitian Pager: [phone] Weekend/After hours pager: [phone]

## 2013-07-10 NOTE — Progress Notes (Signed)
Day 2 of LTVM started, electrodes repaired.

## 2013-07-10 NOTE — Progress Notes (Signed)
Family Medicine Teaching Service Daily Progress Note Intern Pager: [phone]  Patient name: [name] Medical record number: [id number] Date of birth: 1946/01/14 Age: 67 y.o. Gender: female  Primary Care Provider: No PCP Per Patient Consultants: Neurology Code Status: Full code  Pt Overview and Major Events to Date:  11/6: Admitted for seizure  11/8: intermittent seizures continued despite vimpat, keppra, dilantin though seizures did not always correspond with EEG. Also febrile to 120, blood cx, urine cx and CXR ordered. Concern for UTI-Levaquin started 11/9: Vanc started for possible staph UTI 11/10: To get MRI, EEG. E. coli UTI on culture, intubated for propofol drip  Assessment and Plan: [name] is a 67 y.o. female presenting with seizure . PMH is significant for seizure disorder, schizophrenia, CHF, HTN, diabetes, and Hepatitis A,B, and C.   # Seizure: currently on propofol, intubated in ICU, undergoing EEG monitoring.  - management per neurology  # SIRS: Fever, tachycardia, tachypnea. WBC 5.5 - Urine culture with >100k E. Coli, pan sensitive - Continue vancomycin, levaquin (11/9-) for MRSA coverage given increased seizure activity and AMS - Seizures preceded fever and have presumptive source, but consider LP if other tests negative, defer to neurology [ ]  Following Blood culture (11/08): NGTD  # Diabetes: No home medications. Hb A1c: 6.7, controlled without medications - continue CBG checks, no SSI at this time  # Hypertension & CHF: Systolic hypertension at baseline, with some labile BP's and hypotension likely secondary to propofol - levophed ordered per CCM, for use with propofol if needed, has not required thus far - Will recommend echo as outpatient - IVF at 100 cc/hr presently, watch closely for signs of fluid overload given hx of CHF  # Hypokalemia: 3.3 today: repleted by CCM, reassess in AM # Hypomagnesemia: on oral mag supplementation # Anemia: Hgb 11.2  > 10.7 >10.3 Continue monitoring.  # Psych: Continue home klonopin prn once extubated, home fluphenazine decanoate due to be given in >1 week.   FEN/GI: on TF, NS @ 100cc/hr Prophylaxis: SCDs  Disposition: remains in ICU, intubated while on propofol drip  Subjective: Intubated  Objective: Temp:  [97.5 F (36.4 C)-98.6 F (37 C)] 97.5 F (36.4 C) (11/11 2000) Pulse Rate:  [57-126] 82 (11/11 2100) Resp:  [9-28] 14 (11/11 2100) BP: (65-153)/(38-97) 94/45 mmHg (11/11 2100) SpO2:  [100 %] 100 % (11/11 2100) FiO2 (%):  [30 %] 30 % (11/11 2100) Weight:  [151 lb 0.2 oz (68.5 kg)] 151 lb 0.2 oz (68.5 kg) (11/11 0400) Physical Exam: General: 67 yo female in NAD, intubated Cardiovascular: RRR, no murmurs HEENT: ETT in place Respiratory: clear BS bilaterally, on vent Abdomen: + BS, Soft, NT ND Extremities: WWP, SCD's in place Neuro: opens eyes to voice, does not follow commands  Laboratory:  Recent Labs Lab 07/08/13 0520 07/09/13 0500 07/10/13 0545  WBC 7.2 5.5 5.1  HGB 11.2* 10.7* 10.3*  HCT 33.6* 32.0* 30.3*  PLT 196 160 128*    Recent Labs Lab 07/05/13 1251  07/07/13 1110  07/09/13 0500 07/09/13 1250 07/10/13 0545  NA 138  < > 136  < > 136 134* 132*  K 4.5  < > 4.1  < > 2.7* 4.1 3.3*  CL 101  < > 102  < > 103 103 103  CO2 25  < > 21  < > 21 20 20   BUN 12  < > 16  < > 14 15 14   CREATININE 0.44*  < > 0.46*  < > 0.35*  0.37* 0.41*  CALCIUM 10.1  < > 9.2  < > 8.0* 8.7 8.4  PROT 8.1  --  7.4  --   --   --   --   BILITOT 0.2*  --  0.2*  --   --   --   --   ALKPHOS 169*  --  138*  --   --   --   --   ALT 23  --  20  --   --   --   --   AST 30  --  30  --   --   --   --   GLUCOSE 108*  < > 161*  < > 113* 109* 124*  < > = values in this interval not displayed. Urine micro few bacteria, 0-2 wbc  UDS + for benzodiazepines  Lactate 1.9  Trop neg  CBG 91  Phenytoin level: 20.3 > 15.9  CT Head:  No acute intracranial findings.  Chronic ischemic microvascular disease.    CXR;  Similar right lung base pleural thickening and scarring without  superimposed acute cardiopulmonary process.  EKG: sinus tachycardia  [name] Dodrill, MD 07/10/2013, 11:18 PM PGY-2, [facility] Family Medicine FPTS Intern pager: [phone], text pages welcome

## 2013-07-10 NOTE — Progress Notes (Signed)
eLink Physician-Brief Progress Note Patient Name: [name] DOB: 08-23-1946 MRN: [id number]  Date of Service  07/10/2013   HPI/Events of Note  Hypokalemia, hypophosphatemia, hypomag   eICU Interventions  Phos and Potassium replaced Already on oral mag   Intervention Category Intermediate Interventions: Electrolyte abnormality - evaluation and management  DETERDING,ELIZABETH 07/10/2013, 6:34 AM

## 2013-07-10 NOTE — Progress Notes (Signed)
UR completed.  Geddy Boydstun, RN BSN MHA CCM Trauma/Neuro ICU Case Manager [phone]  

## 2013-07-10 NOTE — Progress Notes (Signed)
PULMONARY  / CRITICAL CARE MEDICINE  Name: [name] MRN: [id number] DOB: 1946-03-19    ADMISSION DATE:  07/05/2013 CONSULTATION DATE:  07/06/2013  REFERRING MD :  Lelon Mast PRIMARY SERVICE: FMTS  CHIEF COMPLAINT:  Acute encephalopathy  BRIEF PATIENT DESCRIPTION: 67 yo NH resident admitted 11/6 with seizures.  On 11/7 patient had several seizures associated with brief apnea and fall in SpO2.  EEG 11/10 showed electrographic seizures that seem to emanate from the bifrontal regions and propagate to both cerebral hemispheres, pt transferred to neuro ICU and PCCM consulted for intubation.  SIGNIFICANT EVENTS / STUDIES:  11/6  Admitted with seizures 11/6  Head CT >>> nad 11/7  Seizures with apnea and desaturation.  PCCM consulted. 11/7  Continuous EEG >>> frequent sharp activity over the right hemisphere. This sharp activity does not always correspond to the clinical activity.  No evidence of electrographic seizures 11/8 Head CT >>> No evidence of acute intracranial abnormality 11/10 EEG >>> electrographic seizures that seem to emanate from the bifrontal regions and propagate to both cerebral hemispheres.  Pt transferred to neuro ICU and intubated. 11/10 MRI Brain >>>  LINES / TUBES: 11/9 PICC >>> 11/9 Foley>>> 11/10 ETT >>> 11/10 OGT >>>  CULTURES: Urine 11/8 >>> E.Coli Urine gram stain 11/8 >>> GPC in clusters Blood 11/8 >>>  ANTIBIOTICS: Levaquin 11/9 >>> Vanc 11/10 >>>  SUBJECTIVE:  Hypotension overnight requiring IVF and decrease of sedation, withdrawing to pain this AM.  VITAL SIGNS: Temp:  [97.9 F (36.6 C)-100.3 F (37.9 C)] 98.1 F (36.7 C) (11/11 0713) Pulse Rate:  [57-113] 78 (11/11 0713) Resp:  [13-24] 16 (11/11 0713) BP: (65-148)/(36-85) 101/51 mmHg (11/11 0713) SpO2:  [96 %-100 %] 100 % (11/11 0700) FiO2 (%):  [30 %-60 %] 30 % (11/11 0600) Weight:  [66.2 kg (145 lb 15.1 oz)-68.5 kg (151 lb 0.2 oz)] 68.5 kg (151 lb 0.2 oz) (11/11 0400)  HEMODYNAMICS:    VENTILATOR SETTINGS: Vent Mode:  [-] PRVC FiO2 (%):  [30 %-60 %] 30 % Set Rate:  [16 bmp] 16 bmp Vt Set:  [500 mL] 500 mL PEEP:  [5 cmH20] 5 cmH20 Plateau Pressure:  [15 cmH20-16 cmH20] 16 cmH20 INTAKE / OUTPUT: Intake/Output     11/10 0701 - 11/11 0700 11/11 0701 - 11/12 0700   I.V. (mL/kg) 1457.3 (21.3)    NG/GT 150    IV Piggyback 1687.5    Total Intake(mL/kg) 3294.8 (48.1)    Urine (mL/kg/hr) 950 (0.6)    Total Output 950     Net +2344.8           PHYSICAL EXAMINATION: General:  Chronically ill appearing female, in bed, sedated and intubated. Neuro: Sedated and intubated, withdraws all ext to command. HEENT:  [place]/AT. PERRL with right sided gaze.  MMM. Cardiovascular:  RRR, no M/R/G. Lungs:  Resps even and unlabored.  CTA bilaterally. Abdomen:  BS x 4.  Soft, NT/ND. Musculoskeletal:  No gross deformities.  No edema. Skin:  Warm and dry.   Recent Labs Lab 07/05/13 1251 07/05/13 1257 07/05/13 1356 07/06/13 0547 07/07/13 1110 07/08/13 0520 07/08/13 1030 07/09/13 0500 07/09/13 1250 07/09/13 1935 07/10/13 0420 07/10/13 0545  HGB 12.9  --   --  12.0 11.4* 11.2*  --  10.7*  --   --   --  10.3*  WBC 4.8  --   --  4.5 4.7 7.2  --  5.5  --   --   --  5.1  PLT 210  --   --  215 222 196  --  160  --   --   --  128*  NA 138  --   --  139 136  --  137 136 134*  --   --  132*  K 4.5  --   --  3.4* 4.1  --  3.6 2.7* 4.1  --   --  3.3*  CL 101  --   --  104 102  --  102 103 103  --   --  103  CO2 25  --   --  21 21  --  21 21 20   --   --  20  GLUCOSE 108*  --   --  91 161*  --  159* 113* 109*  --   --  124*  BUN 12  --   --  9 16  --  13 14 15   --   --  14  CREATININE 0.44*  --   --  0.48* 0.46*  --  0.42* 0.35* 0.37*  --   --  0.41*  CALCIUM 10.1  --   --  9.5 9.2  --  8.9 8.0* 8.7  --   --  8.4  MG  --   --   --   --   --   --   --  1.4* 1.6  --   --  1.4*  PHOS  --   --   --   --   --   --   --   --   --   --   --  2.2*  AST 30  --   --   --  30  --   --   --   --   --    --   --   ALT 23  --   --   --  20  --   --   --   --   --   --   --   ALKPHOS 169*  --   --   --  138*  --   --   --   --   --   --   --   BILITOT 0.2*  --   --   --  0.2*  --   --   --   --   --   --   --   PROT 8.1  --   --   --  7.4  --   --   --   --   --   --   --   ALBUMIN 4.0  --   --   --  3.5  --   --   --   --   --   --   --   LATICACIDVEN  --   --  1.90  --   --   --   --   --   --   --   --   --   TROPONINI  --  <0.30  --   --   --   --   --   --   --   --   --   --   PHART  --   --   --   --   --   --   --   --   --  7.474* 7.433  --   PCO2ART  --   --   --   --   --   --   --   --   --  29.0* 30.1*  --   PO2ART  --   --   --   --   --   --   --   --   --  261.0* 128.0*  --   HCO3  --   --   --   --   --   --   --   --   --  21.0 19.8*  --   O2SAT  --   --   --   --   --   --   --   --   --  99.8 98.9  --     Recent Labs Lab 07/09/13 0825 07/09/13 1122 07/09/13 1953 07/10/13 0002 07/10/13 0445  GLUCAP 112* 128* 105* 104* 125*   CXR 11/10:  No acute findings.  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 - Intubated 11/10. - Full vent support. - Decrease RR to 14 and FiO2 to 30%. - VAP bundle. - CXR and ABG in AM. - SBT/WUA once neurology is ready for [place] to decrease propofol.  Sepsis - in the setting of UTI - positive UA/urine cultures. - Cont empiric vanc/levaquin. - F/u blood cultures, NTD. - Cont IVF's. - Maintain SBP > 90, UOP > 0.64ml/kg/hr. - Monitor WBC/fever curve.  Renal: hypo K, Mg and Phos. - Maintain IVF. - Replace K, Mg and Phos. - BMET in AM.  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   - Propofol for sedation. - Cont continuous EEG. - Manage seizures per neuro recs. - Neuro following.  HTN, CHF - Per primary team.  DM - Per primary team  Vent adjusted, will wait neuro to evaluate  to see when they feel will be ready to decrease propofol, can not imagine extubation being an issue unless airway protection is a concern, start TF and replace Mg, Phos and K.  CC time 35 min.  Alyson Reedy, M.D. [facility] Pulmonary/Critical Care Medicine. Pager: [phone]. After hours pager: [phone].

## 2013-07-10 NOTE — Progress Notes (Signed)
NEURO HOSPITALIST PROGRESS NOTE   SUBJECTIVE:                                                                                                                        Intubated on the vent , on propofol. C-EEG still displaying rhythmic bi cerebral epileptiform discharges  On IV keppra 2,000 mg, IV vimpat 200 g BID, dilantin IV 300 mg, and propofol. Dilantin level 8.7 OBJECTIVE:                                                                                                                          Vital signs in last 24 hours: Temp:  [97.9 F (36.6 C)-100.3 F (37.9 C)] 98.1 F (36.7 C) (11/11 0713) Pulse Rate:  [57-113] 78 (11/11 0800) Resp:  [13-24] 16 (11/11 0800) BP: (65-148)/(36-85) 100/59 mmHg (11/11 0800) SpO2:  [96 %-100 %] 100 % (11/11 0800) FiO2 (%):  [30 %-60 %] 30 % (11/11 0600) Weight:  [66.2 kg (145 lb 15.1 oz)-68.5 kg (151 lb 0.2 oz)] 68.5 kg (151 lb 0.2 oz) (11/11 0400)  Intake/Output from previous day: 11/10 0701 - 11/11 0700 In: 3294.8 [I.V.:1457.3; NG/GT:150; IV Piggyback:1687.5] Out: 950 [Urine:950] Intake/Output this shift: Total I/O In: 105.8 [I.V.:105.8] Out: 80 [Urine:80] Nutritional status: NPO  Past Medical History  Diagnosis Date  . CHF (congestive heart failure)   . Seizures   . Schizophrenia   . Diabetes mellitus without complication   . Hypertension   . Asthma   . Hepatitis A   . Hepatitis B   . Hepatitis C   . Respiratory failure   . Smoker     1 pack daily    Neurologic Exam:  Mental status: on propofol. Will defer N/E due to propofol.  Lab Results: No results found for this basename: cbc, bmp, coags, chol, tri, ldl, hga1c   Lipid Panel No results found for this basename: CHOL, TRIG, HDL, CHOLHDL, VLDL, LDLCALC,  in the last 72 hours  Studies/Results: Dg Chest Port 1 View  07/10/2013   CLINICAL DATA:  Evaluate endotracheal tube positioning  EXAM: PORTABLE CHEST - 1 VIEW  COMPARISON:   07/09/2013; 07/05/2013; 03/28/2013  FINDINGS: Grossly unchanged cardiac silhouette and mediastinal contours. Interval placement of enteric tube  with tip and side port projecting below the left hemidiaphragm. Otherwise, stable position of remaining support apparatus. No pneumothorax. Trace right-sided pleural effusion is unchanged. Grossly unchanged left basilar heterogeneous opacities. No evidence of edema. Unchanged bones.  IMPRESSION: 1. Interval placement of enteric tube with tip and side port projecting below the left hemidiaphragm. Otherwise, stable positioning of remaining support apparatus. No pneumothorax. 2. Grossly unchanged trace right-sided effusion and bibasilar atelectasis.   Electronically Signed   By: Simonne Come M.D.   On: 07/10/2013 08:04   Dg Chest Port 1 View  07/09/2013   CLINICAL DATA:  ET tube placement.  EXAM: PORTABLE CHEST - 1 VIEW  COMPARISON:  07/07/2013  FINDINGS: Endotracheal tube is approximately 4 cm above the carina. Right PICC line tip is at the cavoatrial junction. Heart is normal size. Blunting of the right costophrenic angle is stable dating back to 03/28/2013, likely pleural thickening. No confluent airspace opacities or acute bony abnormality.  IMPRESSION: Endotracheal tube approximately 4 cm above the carina. Right PICC line tip at the cavoatrial junction. No acute cardiopulmonary disease.   Electronically Signed   By: Charlett Nose M.D.   On: 07/09/2013 18:26   Dg Abd Portable 1v  07/09/2013   CLINICAL DATA:  Orogastric tube placement.  EXAM: PORTABLE ABDOMEN - 1 VIEW  COMPARISON:  None.  FINDINGS: Orogastric tube tip in the distal stomach in the region of the gastric pylorus. Normal bowel gas pattern. Lumbar spine degenerative changes. Atheromatous arterial calcifications.  IMPRESSION: Orogastric tube tip in the distal stomach.   Electronically Signed   By: Gordan Payment M.D.   On: 07/09/2013 21:19    MEDICATIONS                                                                                                                        I have reviewed the patient's current medications.  ASSESSMENT/PLAN:                                                                                                           NCSE. Will give additional dose 200 mg IV dilantin now and check level in 2 hours. Continue current treatment.   Wyatt Portela, MD [facility] [phone]  07/10/2013, 8:13 AM

## 2013-07-11 ENCOUNTER — Inpatient Hospital Stay (HOSPITAL_COMMUNITY): Payer: Medicare Other

## 2013-07-11 DIAGNOSIS — E119 Type 2 diabetes mellitus without complications: Secondary | ICD-10-CM

## 2013-07-11 LAB — BASIC METABOLIC PANEL
CO2: 18 mEq/L — ABNORMAL LOW (ref 19–32)
Calcium: 8.5 mg/dL (ref 8.4–10.5)
Creatinine, Ser: 0.4 mg/dL — ABNORMAL LOW (ref 0.50–1.10)
GFR calc Af Amer: >90 mL/min (ref >90)
GFR calc non Af Amer: >90 mL/min (ref >90)
Glucose, Bld: 129 mg/dL — ABNORMAL HIGH (ref 70–99)
Sodium: 137 mEq/L (ref 135–145)

## 2013-07-11 LAB — CBC
HCT: 29.9 % — ABNORMAL LOW (ref 36.0–46.0)
MCH: 30.4 pg (ref 26.0–34.0)
MCHC: 33.8 g/dL (ref 30.0–36.0)
MCV: 90.1 fL (ref 78.0–100.0)
Platelets: 124 10*3/uL — ABNORMAL LOW (ref 150–400)
RBC: 3.32 MIL/uL — ABNORMAL LOW (ref 3.87–5.11)
RDW: 14.4 % (ref 11.5–15.5)
WBC: 5.4 10*3/uL (ref 4.0–10.5)

## 2013-07-11 LAB — URINE CULTURE: Colony Count: >=100000

## 2013-07-11 LAB — GLUCOSE, CAPILLARY
Glucose-Capillary: 114 mg/dL — ABNORMAL HIGH (ref 70–99)
Glucose-Capillary: 93 mg/dL (ref 70–99)

## 2013-07-11 LAB — BLOOD GAS, ARTERIAL
Acid-base deficit: 4.4 mmol/L — ABNORMAL HIGH (ref 0.0–2.0)
Bicarbonate: 19.5 mEq/L — ABNORMAL LOW (ref 20.0–24.0)
FIO2: 0.3 %
MECHVT: 500 mL
PEEP: 5 cmH2O
Patient temperature: 97.9
TCO2: 20.5 mmol/L (ref 0–100)
pCO2 arterial: 31.2 mmHg — ABNORMAL LOW (ref 35.0–45.0)
pH, Arterial: 7.41 (ref 7.350–7.450)
pO2, Arterial: 127 mmHg — ABNORMAL HIGH (ref 80.0–100.0)

## 2013-07-11 LAB — POCT I-STAT 3, ART BLOOD GAS (G3+)
Acid-base deficit: 5 mmol/L — ABNORMAL HIGH (ref 0.0–2.0)
O2 Saturation: 99 %
pCO2 arterial: 34.7 mmHg — ABNORMAL LOW (ref 35.0–45.0)
pH, Arterial: 7.364 (ref 7.350–7.450)

## 2013-07-11 LAB — MAGNESIUM: Magnesium: 1.6 mg/dL (ref 1.5–2.5)

## 2013-07-11 LAB — PHOSPHORUS: Phosphorus: 2.9 mg/dL (ref 2.3–4.6)

## 2013-07-11 LAB — PHENYTOIN LEVEL, FREE AND TOTAL: Phenytoin, Free: 0.6 mg/L — ABNORMAL LOW (ref 1.0–2.0)

## 2013-07-11 MED ORDER — MAGNESIUM SULFATE 40 MG/ML IJ SOLN
2.0000 g | Freq: Once | INTRAMUSCULAR | Status: AC
  Administered 2013-07-11: 2 g via INTRAVENOUS
  Filled 2013-07-11: qty 50

## 2013-07-11 MED ORDER — POTASSIUM CHLORIDE 10 MEQ/50ML IV SOLN
10.0000 meq | INTRAVENOUS | Status: AC
  Administered 2013-07-11 (×4): 10 meq via INTRAVENOUS
  Filled 2013-07-11 (×4): qty 50

## 2013-07-11 NOTE — Progress Notes (Signed)
Family Medicine Teaching Service Daily Progress Note Intern Pager: [phone]  Patient name: [name] Medical record number: [id number] Date of birth: [birth year] Age: 67 y.o. Gender: female  Primary Care Provider: No PCP Per Patient Consultants: Neurology, CCM Code Status: Full code  Pt Overview and Major Events to Date:  11/6: Admitted for seizure  11/8: intermittent seizures continued despite vimpat, keppra, dilantin though seizures did not always correspond with EEG. Also febrile to 102, blood cx, urine cx and CXR ordered. Concern for UTI-Levaquin started 11/9: Vanc started for possible staph UTI 11/10: To get MRI, EEG. E. coli UTI on culture, intubated for propofol drip  Assessment and Plan: [name] is a 67 y.o. female presenting with seizure . PMH is significant for seizure disorder, schizophrenia, CHF, HTN, diabetes, and Hepatitis A,B, and C.   # Seizure: propofol currently stopped, intubated in ICU, undergoing EEG monitoring.  - management per neurology - vent with propofol per CCM management, currently on CPAP 5/5 FIO2 30%, planning for wean & extubation today per ICU staff  # SIRS: Fever, tachycardia, tachypnea. WBC 5.5 - Urine culture with >100k E. Coli, pan sensitive - Continue vancomycin, levaquin (11/9-) for MRSA coverage given increased seizure activity and AMS - Seizures preceded fever and have presumptive source, but consider LP if other tests negative, defer to neurology [ ]  Following Blood culture (11/08): NGTD  # Diabetes: No home medications. Hb A1c: 6.7, controlled without medications - continue CBG checks, no SSI at this time  # Hypertension & CHF: Systolic hypertension at baseline, with some labile BP's and hypotension likely secondary to propofol - levophed ordered per CCM, for use with propofol if needed, has not required thus far - Will recommend echo as outpatient - IVF at 100 cc/hr presently, watch closely for signs of fluid overload given hx  of CHF  # Hypokalemia: 3.2 today: repleted by CCM, reassess in AM # Hypomagnesemia: repleted per CCM # Anemia: Hgb 11.2 > 10.7 >10.3>10.2 Continue monitoring.  # Psych: Continue home klonopin prn once extubated, home fluphenazine decanoate due to be given in >1 week.   FEN/GI: on TF, NS @ 100cc/hr Prophylaxis: SCDs  Disposition: remains in ICU, intubated but apparent possible extubation today, appreciate neurology and CCM help in caring for Ms. Daoust  Subjective: Intubated  Objective: Temp:  [97.5 F (36.4 C)-99 F (37.2 C)] 97.9 F (36.6 C) (11/12 0700) Pulse Rate:  [69-126] 93 (11/12 0906) Resp:  [9-28] 20 (11/12 0906) BP: (83-153)/(45-97) 137/61 mmHg (11/12 0800) SpO2:  [99 %-100 %] 100 % (11/12 0906) FiO2 (%):  [30 %] 30 % (11/12 0906) Weight:  [169 lb 15.6 oz (77.1 kg)] 169 lb 15.6 oz (77.1 kg) (11/12 0437) Physical Exam: General: 67 yo female in NAD, intubated, eyes spontaneously opened Cardiovascular: RRR, no murmurs HEENT: ETT in place Respiratory: clear BS bilaterally, on vent Abdomen: Soft, NT ND Extremities: WWP, SCD's in place Neuro: eyes open, does not follow commands, constant tremor of left foot  Laboratory:  Recent Labs Lab 07/09/13 0500 07/10/13 0545 07/11/13 0341  WBC 5.5 5.1 5.4  HGB 10.7* 10.3* 10.1*  HCT 32.0* 30.3* 29.9*  PLT 160 128* 124*    Recent Labs Lab 07/05/13 1251  07/07/13 1110  07/09/13 1250 07/10/13 0545 07/11/13 0341  NA 138  < > 136  < > 134* 132* 137  K 4.5  < > 4.1  < > 4.1 3.3* 3.2*  CL 101  < > 102  < > 103 103  107  CO2 25  < > 21  < > 20 20 18*  BUN 12  < > 16  < > 15 14 9   CREATININE 0.44*  < > 0.46*  < > 0.37* 0.41* 0.40*  CALCIUM 10.1  < > 9.2  < > 8.7 8.4 8.5  PROT 8.1  --  7.4  --   --   --   --   BILITOT 0.2*  --  0.2*  --   --   --   --   ALKPHOS 169*  --  138*  --   --   --   --   ALT 23  --  20  --   --   --   --   AST 30  --  30  --   --   --   --   GLUCOSE 108*  < > 161*  < > 109* 124* 129*  < > =  values in this interval not displayed. Urine micro few bacteria, 0-2 wbc  UDS + for benzodiazepines  Lactate 1.9  Trop neg  CBG 91  Phenytoin level: 20.3 > 15.9  CT Head:  No acute intracranial findings.  Chronic ischemic microvascular disease.   CXR;  Similar right lung base pleural thickening and scarring without  superimposed acute cardiopulmonary process.  EKG: sinus tachycardia  [name], MD 07/11/2013, 9:33 AM PGY-2, [facility] FPTS Intern pager: [phone], text pages welcome

## 2013-07-11 NOTE — Progress Notes (Signed)
Subjective: Level of alertness and responsiveness improved. No recurrent seizure activity reported. Currently on Keppra, Lamictal, Vimpat and Dilantin. Dilantin level yesterday was 11.5 (uncorrected).  Objective: Current vital signs: BP 122/58  Pulse 93  Temp(Src) 97.9 F (36.6 C) (Axillary)  Resp 20  Ht 5\' 5"  (1.651 m)  Wt 77.1 kg (169 lb 15.6 oz)  BMI 28.29 kg/m2  SpO2 100%  Neurologic Exam: Alert and in no acute distress. Confused and agitated, and only minimally corporative with examination. Able to follow simple commands. Ongoing EEG showed no seizure activity. Pupils were equal and reacted normally to light. Extraocular movements were full and conjugate. Face was symmetrical with no focal weakness. Moved extremities equally with normal strength throughout.  Medications: I have reviewed the patient's current medications.  Assessment/Plan: Status epilepticus, resolved. Patient is still confused, however, and moderately agitated.  Recommend no changes in current management. No contraindication neurologically to planned extubation today.  We will continue to follow this patient with you.  C.R. Roseanne Reno, MD [facility] [phone]  07/11/2013  10:18 AM

## 2013-07-11 NOTE — Progress Notes (Signed)
Spoke with Dr. Roseanne Reno about pt's neuro status.  Pt seems to be just laughing and not answering questions appropriately.  OK'd to start patient on clear liquids.  Notified Dr. Roseanne Reno of U.O. Status as well.  of urine out today since 8am.  Will continue to monitor.  OK'd to order PT/OT.

## 2013-07-11 NOTE — Progress Notes (Signed)
PULMONARY  / CRITICAL CARE MEDICINE  Name: [name] MRN: [id number] DOB: [birth year]    ADMISSION DATE:  07/05/2013 CONSULTATION DATE:  07/06/2013  REFERRING MD :  Lelon Mast PRIMARY SERVICE: FMTS  CHIEF COMPLAINT:  Acute encephalopathy  BRIEF PATIENT DESCRIPTION: 67 yo NH resident admitted 11/6 with seizures.  On 11/7 patient had several seizures associated with brief apnea and fall in SpO2.  EEG 11/10 showed electrographic seizures that seem to emanate from the bifrontal regions and propagate to both cerebral hemispheres, pt transferred to neuro ICU and PCCM consulted for intubation.  SIGNIFICANT EVENTS / STUDIES:  11/6  Admitted with seizures 11/6  Head CT >>> nad 11/7  Seizures with apnea and desaturation.  PCCM consulted. 11/7  Continuous EEG >>> frequent sharp activity over the right hemisphere. This sharp activity does not always correspond to the clinical activity.  No evidence of electrographic seizures 11/8 Head CT >>> No evidence of acute intracranial abnormality 11/10 EEG >>> electrographic seizures that seem to emanate from the bifrontal regions and propagate to both cerebral hemispheres.  Pt transferred to neuro ICU and intubated. 11/11 24 hour EEG >>>  LINES / TUBES: 11/9 PICC >>> 11/9 Foley>>> 11/10 ETT >>> 11/10 OGT >>>  CULTURES: Urine 11/8 >>> E.Coli Urine gram stain 11/8 >>> GPC in clusters Blood 11/8 >>> neg  ANTIBIOTICS: Levaquin 11/9 >>> Vanc 11/10 >>>11/12  SUBJECTIVE:  Propofol to off today per neuro.  Is awake and following commands.  Tube feeds turned to off.  VITAL SIGNS: Temp:  [97.5 F (36.4 C)-99 F (37.2 C)] 97.9 F (36.6 C) (11/12 0700) Pulse Rate:  [69-126] 85 (11/12 0800) Resp:  [9-28] 19 (11/12 0800) BP: (83-153)/(45-97) 137/61 mmHg (11/12 0800) SpO2:  [99 %-100 %] 100 % (11/12 0800) FiO2 (%):  [30 %] 30 % (11/12 0800) Weight:  [169 lb 15.6 oz (77.1 kg)] 169 lb 15.6 oz (77.1 kg) (11/12 0437)  HEMODYNAMICS:   VENTILATOR  SETTINGS: Vent Mode:  [-] PRVC FiO2 (%):  [30 %] 30 % Set Rate:  [14 bmp] 14 bmp Vt Set:  [500 mL] 500 mL PEEP:  [5 cmH20] 5 cmH20 Plateau Pressure:  [12 cmH20-19 cmH20] 16 cmH20 INTAKE / OUTPUT: Intake/Output     11/11 0701 - 11/12 0700 11/12 0701 - 11/13 0700   I.V. (mL/kg) 2642 (34.3)    Other 40    NG/GT 705.2 40   IV Piggyback 1439 50   Total Intake(mL/kg) 4826.2 (62.6) 90 (1.2)   Urine (mL/kg/hr) 1885 (1) 140 (1.3)   Total Output 1885 140   Net +2941.2 -50         PHYSICAL EXAMINATION: General:  Chronically ill appearing female, awake, intubated Neuro: awake and follows commands, moves extremities x 4 HEENT:  NCAT, PERRL, MMM. Cardiovascular:  RRR, no M/R/G. Lungs:  Resps even and unlabored.  CTA bilaterally. Abdomen:  BS x 4.  Soft, NT/ND. Musculoskeletal:  No gross deformities.  No edema. Skin:  Warm and dry.   Recent Labs Lab 07/05/13 1251 07/05/13 1257 07/05/13 1356  07/07/13 1110 07/08/13 0520 07/08/13 1030 07/09/13 0500 07/09/13 1250 07/09/13 1935 07/10/13 0420 07/10/13 0545 07/11/13 0341 07/11/13 0545  HGB 12.9  --   --   < > 11.4* 11.2*  --  10.7*  --   --   --  10.3* 10.1*  --   WBC 4.8  --   --   < > 4.7 7.2  --  5.5  --   --   --  5.1 5.4  --   PLT 210  --   --   < > 222 196  --  160  --   --   --  128* 124*  --   NA 138  --   --   < > 136  --  137 136 134*  --   --  132* 137  --   K 4.5  --   --   < > 4.1  --  3.6 2.7* 4.1  --   --  3.3* 3.2*  --   CL 101  --   --   < > 102  --  102 103 103  --   --  103 107  --   CO2 25  --   --   < > 21  --  21 21 20   --   --  20 18*  --   GLUCOSE 108*  --   --   < > 161*  --  159* 113* 109*  --   --  124* 129*  --   BUN 12  --   --   < > 16  --  13 14 15   --   --  14 9  --   CREATININE 0.44*  --   --   < > 0.46*  --  0.42* 0.35* 0.37*  --   --  0.41* 0.40*  --   CALCIUM 10.1  --   --   < > 9.2  --  8.9 8.0* 8.7  --   --  8.4 8.5  --   MG  --   --   --   --   --   --   --  1.4* 1.6  --   --  1.4* 1.6  --    PHOS  --   --   --   --   --   --   --   --   --   --   --  2.2* 2.9  --   AST 30  --   --   --  30  --   --   --   --   --   --   --   --   --   ALT 23  --   --   --  20  --   --   --   --   --   --   --   --   --   ALKPHOS 169*  --   --   --  138*  --   --   --   --   --   --   --   --   --   BILITOT 0.2*  --   --   --  0.2*  --   --   --   --   --   --   --   --   --   PROT 8.1  --   --   --  7.4  --   --   --   --   --   --   --   --   --   ALBUMIN 4.0  --   --   --  3.5  --   --   --   --   --   --   --   --   --   LATICACIDVEN  --   --  1.90  --   --   --   --   --   --   --   --   --   --   --   TROPONINI  --  <0.30  --   --   --   --   --   --   --   --   --   --   --   --   PHART  --   --   --   --   --   --   --   --   --  7.474* 7.433  --   --  7.410  PCO2ART  --   --   --   --   --   --   --   --   --  29.0* 30.1*  --   --  31.2*  PO2ART  --   --   --   --   --   --   --   --   --  261.0* 128.0*  --   --  127.0*  HCO3  --   --   --   --   --   --   --   --   --  21.0 19.8*  --   --  19.5*  O2SAT  --   --   --   --   --   --   --   --   --  99.8 98.9  --   --  98.7  < > = values in this interval not displayed.  Recent Labs Lab 07/10/13 1609 07/10/13 1956 07/11/13 0054 07/11/13 0428 07/11/13 0727  GLUCAP 97 97 114* 137* 115*   CXR 11/10:  No acute findings.  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 - Extubate today after SBT and F/U ABG. - Supplemental O2 prn. - Sat goals >92%.  Sepsis - in the setting of UTI - positive UA/urine cultures. - Cont vanc/levaquin - Monitor WBC/fever curve. - D/C vancomycin.  Renal: hypo K, Mg and Phos. - Maintain IVF. - Replace K and Mg. - BMET in AM.  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   - D/c Propofol. - Manage seizures per neuro recs. - Neuro following.   HTN,  CHF - Per primary team.  DM - Per primary team  Carley Hammed Physician Assistant Student 07/11/13  Extubate today once propofol off.  Have replaced K.  Will continue abx.  Consider PO abx tomorrow.  CC time 35 min.  Alyson Reedy, M.D. [facility] Pulmonary/Critical Care Medicine. Pager: [phone]. After hours pager: [phone].

## 2013-07-11 NOTE — Progress Notes (Signed)
Wasted Fentanyl gtt with Val Eagle, RN.  WIS.

## 2013-07-11 NOTE — Progress Notes (Signed)
Attending Addendum  I examined the patient and discussed the assessment and plan with Dr. Pollie Meyer. I have reviewed the note and agree.  Anticipate intubation today as patient is more alert and attempting to speak. Very much appreciate the care provided by PCCM and Neuro. Agree with d/cing Vanc given clinical improvement and urine culture results. Patient should still have MRI to evaluate increased seizure activity.     Dessa Phi, MD FAMILY MEDICINE TEACHING SERVICE

## 2013-07-11 NOTE — Progress Notes (Signed)
eLink Nursing ICU Electrolyte Replacement Protocol  Patient Name: NEHA WAIGHT DOB: [birth year] MRN: [id number]  Date of Service  07/11/2013   HPI/Events of Note    Recent Labs Lab 07/08/13 1030 07/09/13 0500 07/09/13 1250 07/10/13 0545 07/11/13 0341  NA 137 136 134* 132* 137  K 3.6 2.7* 4.1 3.3* 3.2*  CL 102 103 103 103 107  CO2 21 21 20 20  18*  GLUCOSE 159* 113* 109* 124* 129*  BUN 13 14 15 14 9   CREATININE 0.42* 0.35* 0.37* 0.41* 0.40*  CALCIUM 8.9 8.0* 8.7 8.4 8.5  MG  --  1.4* 1.6 1.4* 1.6  PHOS  --   --   --  2.2* 2.9    Estimated Creatinine Clearance: 71.9 ml/min (by C-G formula based on Cr of 0.4).  Intake/Output     11/11 0701 - 11/12 0700   I.V. (mL/kg) 5827.5 (75.6)   Other 40   NG/GT 585.2   IV Piggyback 1184   Total Intake(mL/kg) 7636.6 (99)   Urine (mL/kg/hr) 1785 (1)   Total Output 1785   Net +5851.6        - I/O DETAILED x24h    Total I/O In: 5203.6 [I.V.:4544.1; NG/GT:424.5; IV Piggyback:235] Out: 475 [Urine:475] - I/O THIS SHIFT    ASSESSMENT   eICURN Interventions  K+ 3.2 Mg 1.6 Replaced using ICU protocol. MD notified.   ASSESSMENT: MAJOR ELECTROLYTE    Merita Norton 07/11/2013, 5:38 AM

## 2013-07-11 NOTE — Clinical Social Work Note (Signed)
Clinical Social Worker received a referral due to patient current residency at [facility].  CSW has contacted facility who is agreeable with return pending patient needs at discharge.  CSW has left a message with patient son [name]) and await return call to complete full assessment.  CSW remains available for support and to facilitate patient discharge needs once medically stable.  [name], [place] [phone]

## 2013-07-12 LAB — BASIC METABOLIC PANEL
BUN: 4 mg/dL — ABNORMAL LOW (ref 6–23)
Chloride: 107 mEq/L (ref 96–112)
Creatinine, Ser: 0.37 mg/dL — ABNORMAL LOW (ref 0.50–1.10)
GFR calc Af Amer: >90 mL/min (ref >90)
Glucose, Bld: 106 mg/dL — ABNORMAL HIGH (ref 70–99)
Potassium: 3.6 mEq/L (ref 3.5–5.1)

## 2013-07-12 LAB — CBC
HCT: 32.9 % — ABNORMAL LOW (ref 36.0–46.0)
Hemoglobin: 11.3 g/dL — ABNORMAL LOW (ref 12.0–15.0)
MCH: 31 pg (ref 26.0–34.0)
MCV: 90.1 fL (ref 78.0–100.0)
RBC: 3.65 MIL/uL — ABNORMAL LOW (ref 3.87–5.11)
WBC: 5.4 10*3/uL (ref 4.0–10.5)

## 2013-07-12 LAB — GLUCOSE, CAPILLARY
Glucose-Capillary: 97 mg/dL (ref 70–99)
Glucose-Capillary: 95 mg/dL (ref 70–99)
Glucose-Capillary: 87 mg/dL (ref 70–99)
Glucose-Capillary: 97 mg/dL (ref 70–99)
Glucose-Capillary: 124 mg/dL — ABNORMAL HIGH (ref 70–99)

## 2013-07-12 LAB — PHOSPHORUS: Phosphorus: 3.2 mg/dL (ref 2.3–4.6)

## 2013-07-12 MED ORDER — METOPROLOL TARTRATE 25 MG PO TABS
25.0000 mg | ORAL_TABLET | Freq: Two times a day (BID) | ORAL | Status: DC
  Administered 2013-07-12 – 2013-07-16 (×8): 25 mg via ORAL
  Filled 2013-07-12 (×10): qty 1

## 2013-07-12 NOTE — Clinical Social Work Note (Signed)
Clinical Social Work Department BRIEF PSYCHOSOCIAL ASSESSMENT 07/12/2013  Patient:  Bala,Penina A     Account Number:  [id number]     Admit date:  07/05/2013  Clinical Social Worker:  Verl Blalock  Date/Time:  07/12/2013 03:00 PM  Referred by:  RN  Date Referred:  07/11/2013 Referred for  SNF Placement   Other Referral:   Interview type:  Family Other interview type:   Spoke to patient son over the phone - patient oriented to self only    PSYCHOSOCIAL DATA Living Status:  FACILITY Admitted from facility:   Level of care:  Family Care Home Primary support name:  Howard,Grayland  [phone] Primary support relationship to patient:  CHILD, ADULT Degree of support available:   Strong    CURRENT CONCERNS Current Concerns  Post-Acute Placement   Other Concerns:   Patient from [facility] in [place], [place]    SOCIAL WORK ASSESSMENT / PLAN Clinical Social Worker spoke with patient son over the phone to offer support and discuss patient needs at discharge.  Patient son states that patient has been living at [facility] prior to hospitalization.  CSW spoke with family care home who states that if patient is able to return to her baseline she would be able to return. Per therapies, patient is not quite at baseline and feel that SNF placement would be beneficial.  Patient son is in agreement with SNF placement in [place] or [place]. Patient son states that patient has been to [facility] in the past and would prefer that patient not return.  CSW to initiate referral and follow up with patient son regarding potential bed offers.  CSW to communicate with family care home regarding the possibility of a return following ST-SNF stay.  CSW remains available for support and to facilitate patient discharge needs once medically ready.   Assessment/plan status:  Psychosocial Support/Ongoing Assessment of Needs Other assessment/ plan:   Information/referral  to community resources:   Visual merchandiser provided patient son with verbal options of facilities over the phone, however he was in agreement to wait until offers were provided.    PATIENT'S/FAMILY'S RESPONSE TO PLAN OF CARE: Patient alert to self only sitting up in the chair. Patient son and niece offer a good amount of support to patient.  Patient family care home, is anxious to have patient return once able.  Family care home has stated that patient does not respond well with female staff - unit notified.  Patient son verbalized his appreciation for CSW support and concern.

## 2013-07-12 NOTE — Progress Notes (Signed)
Subjective: Patient is more verbally responsive, compared to yesterday. She has not had a recurrent seizure.  Objective: Current vital signs: BP 130/91  Pulse 84  Temp(Src) 98.2 F (36.8 C) (Axillary)  Resp 14  Ht 5\' 5"  (1.651 m)  Wt 67.2 kg (148 lb 2.4 oz)  BMI 24.65 kg/m2  SpO2 100%  Neurologic Exam: Alert and in no acute distress. Patient was agitated and uncooperative with examination. She was verbally abusive, for the most part, including profanity. No facial weakness was noted. Patient moved all extremities equally with good strength throughout.  Medications: I have reviewed the patient's current medications.  Assessment/Plan: Recurrent generalized seizures with status epilepticus presentation, resolved. There no clinical indications of acute intracranial event, including no signs of acute stroke.  No further neurological intervention is indicated. Patient is stable at this point in seizures are controlled. Recommend no changes in current anticonvulsant management.  I will sign off on her care at this point but remain available for reevaluation if clinically indicated.  C.R. Roseanne Reno, MD [facility] [phone]  07/12/2013  10:24 AM

## 2013-07-12 NOTE — Evaluation (Signed)
Physical Therapy Evaluation Patient Details Name: PARIS HOHN MRN: [id number] DOB: 1946/05/10 Today's Date: 07/12/2013 Time: 6578-4696 PT Time Calculation (min): 24 min  PT Assessment / Plan / Recommendation History of Present Illness  KEERSTIN BJELLAND is a 67 y.o. female presenting with seizure . PMH is significant for seizure disorder, schizophrenia, CHF, HTN, diabetes, and Hepatitis A,B, and C.  Clinical Impression  Pt admitted with the above. Pt currently with functional limitations due to the deficits listed below (see PT Problem List). Pt became very agitated at end of session when doctor attempted to assess pt and RN notified.  Pt will benefit from skilled PT to increase their independence and safety with mobility to allow discharge to the venue listed below.      PT Assessment  Patient needs continued PT services    Follow Up Recommendations  SNF    Equipment Recommendations  Other (comment) (TBD)    Frequency Min 3X/week    Precautions / Restrictions Precautions Precautions: Fall Restrictions Weight Bearing Restrictions: No   Pertinent Vitals/Pain Pt unable to rate pain; VSS      Mobility  Bed Mobility Bed Mobility: Supine to Sit;Sitting - Scoot to Edge of Bed Supine to Sit: 1: +2 Total assist;With rails;HOB elevated Supine to Sit: Patient Percentage: 10% Sitting - Scoot to Edge of Bed: 1: +2 Total assist Sitting - Scoot to Edge of Bed: Patient Percentage: 0% Details for Bed Mobility Assistance: Assist to bring LEs and trunk OOB. Use of chuck pad to pivot pt's hips around to EOB. Transfers Transfers: Sit to Stand;Stand to Sit;Stand Pivot Transfers Sit to Stand: 1: +2 Total assist;From bed;With upper extremity assist Sit to Stand: Patient Percentage: 20% Stand to Sit: 1: +2 Total assist;To chair/3-in-1 Stand to Sit: Patient Percentage: 20% Stand Pivot Transfers: 1: +2 Total assist Stand Pivot Transfers: Patient Percentage: 30% Details for Transfer  Assistance: Assist for power up from bed. Facilitation throughout hips for upright posture and balance. Ambulation/Gait Ambulation/Gait Assistance: Not tested (comment)    Exercises     PT Diagnosis: Difficulty walking;Generalized weakness  PT Problem List: Decreased strength;Decreased activity tolerance;Decreased balance;Decreased mobility;Decreased cognition;Decreased knowledge of use of DME;Decreased knowledge of precautions;Decreased safety awareness PT Treatment Interventions: DME instruction;Gait training;Functional mobility training;Therapeutic activities;Therapeutic exercise;Balance training;Neuromuscular re-education;Cognitive remediation;Patient/family education     PT Goals(Current goals can be found in the care plan section) Acute Rehab PT Goals Patient Stated Goal: pt did not state PT Goal Formulation: Patient unable to participate in goal setting Time For Goal Achievement: 07/26/13 Potential to Achieve Goals: Good  Visit Information  Last PT Received On: 07/12/13 Assistance Needed: +2 History of Present Illness: BRITTANNI CARIKER is a 67 y.o. female presenting with seizure . PMH is significant for seizure disorder, schizophrenia, CHF, HTN, diabetes, and Hepatitis A,B, and C.       Prior Functioning  Home Living Family/patient expects to be discharged to:: Skilled nursing facility Dominant Hand: Right    Cognition  Cognition Arousal/Alertness: Awake/alert Behavior During Therapy: Agitated Overall Cognitive Status: No family/caregiver present to determine baseline cognitive functioning Area of Impairment: Following commands;Orientation;Attention;Problem solving Orientation Level: Disoriented to;Time;Situation;Place Current Attention Level: Sustained Following Commands: Follows one step commands inconsistently;Follows one step commands with increased time Problem Solving: Decreased initiation;Requires verbal cues;Requires tactile cues General Comments: Pt repeatedly  stating "You told me it was Monday" throughout session. Doctor came in at end of session to speak to pt.  Doctor touched pt's arm and pt became very agitated with yelling and  profanity. RN made aware that pt increasingly agitated at end of session.    Extremity/Trunk Assessment Upper Extremity Assessment Upper Extremity Assessment: Difficult to assess due to impaired cognition Lower Extremity Assessment Lower Extremity Assessment: Difficult to assess due to impaired cognition   Balance Balance Balance Assessed: Yes Static Sitting Balance Static Sitting - Balance Support: Feet supported;No upper extremity supported Static Sitting - Level of Assistance: 4: Min assist Static Sitting - Comment/# of Minutes: 10 minutes EOB  End of Session PT - End of Session Equipment Utilized During Treatment: Gait belt Activity Tolerance: Treatment limited secondary to agitation Patient left: in chair;with call bell/phone within reach Nurse Communication: Mobility status;Other (comment) (Pt's agitation)  GP     Rudolpho Claxton 07/12/2013, 12:00 PM  Jake Shark, PT DPT [phone]

## 2013-07-12 NOTE — Progress Notes (Addendum)
Attending Addendum  I examined the patient and discussed the assessment and plan with Dr. Jarvis Newcomer. I have reviewed the note and agree.  Patient is much improved. She tolerated extubation well. She is awake and alert today. Able to tell me her name. Stable on 2 L [place] when weaned in the room to R sats still 99-100%. Able to tolerate ice chips in the room.   Plan: Swallow evaluate today --> NGT removal and PO meds if passed.   If fails, keep NG tube.  Nutrition to adjust feeds. Noted drop in bicarb, normal AG, suspect hyperalimentation.  Transfer out of neuro ICU.  D/c foley.     Dessa Phi, MD FAMILY MEDICINE TEACHING SERVICE

## 2013-07-12 NOTE — Evaluation (Signed)
Occupational Therapy Evaluation Patient Details Name: [name] MRN: [id number] DOB: Jun 22, 1946 Today's Date: 07/12/2013 Time: 9147-8295 OT Time Calculation (min): 25 min  OT Assessment / Plan / Recommendation History of present illness [name] is a 67 y.o. female presenting with seizure . PMH is significant for seizure disorder, schizophrenia, CHF, HTN, diabetes, and Hepatitis A,B, and C.   Clinical Impression   Pt admitted with above.  Will benefit from acute OT services to address below problem list. Recommending SNF for d/c planning.    OT Assessment  Patient needs continued OT Services    Follow Up Recommendations  SNF;Supervision/Assistance - 24 hour    Barriers to Discharge      Equipment Recommendations   (TBD at next venue)    Recommendations for Other Services    Frequency  Min 2X/week    Precautions / Restrictions Precautions Precautions: Fall   Pertinent Vitals/Pain See vitals    ADL  Toilet Transfer: Simulated;+2 Total assistance Toilet Transfer: Patient Percentage: 20% Toilet Transfer Method: Stand pivot Toilet Transfer Equipment:  (bed>recliner) Equipment Used: Gait belt Transfers/Ambulation Related to ADLs: +2 total assist for SPT from bed to chair. ADL Comments: Total assist for ADLs at bed level. Limited ADL participation due to agitation and impaired cognition.    OT Diagnosis: Generalized weakness;Cognitive deficits  OT Problem List: Decreased strength;Decreased activity tolerance;Impaired balance (sitting and/or standing);Decreased cognition;Decreased knowledge of use of DME or AE OT Treatment Interventions: Self-care/ADL training;DME and/or AE instruction;Therapeutic activities;Patient/family education;Balance training;Cognitive remediation/compensation   OT Goals(Current goals can be found in the care plan section) Acute Rehab OT Goals Patient Stated Goal: pt did not state OT Goal Formulation: Patient unable to participate in goal  setting Time For Goal Achievement: 07/26/13 Potential to Achieve Goals: Good  Visit Information  Last OT Received On: 07/12/13 Assistance Needed: +2 PT/OT Co-Evaluation/Treatment: Yes History of Present Illness: [name] is a 67 y.o. female presenting with seizure . PMH is significant for seizure disorder, schizophrenia, CHF, HTN, diabetes, and Hepatitis A,B, and C.       Prior Functioning     Home Living Family/patient expects to be discharged to:: Skilled nursing facility Dominant Hand: Right         Vision/Perception     Cognition  Cognition Arousal/Alertness: Awake/alert Behavior During Therapy: Agitated Overall Cognitive Status: No family/caregiver present to determine baseline cognitive functioning Area of Impairment: Following commands;Orientation;Attention;Problem solving Orientation Level: Disoriented to;Time;Situation;Place Current Attention Level: Sustained Following Commands: Follows one step commands inconsistently;Follows one step commands with increased time Problem Solving: Decreased initiation;Requires verbal cues;Requires tactile cues General Comments: Pt repeatedly stating "You told me it was Monday" throughout session. Doctor came in at end of session to speak to pt.  Doctor touched pt's arm and pt became very agitated with yelling and profanity. RN made aware that pt increasingly agitated at end of session.    Extremity/Trunk Assessment Upper Extremity Assessment Upper Extremity Assessment: Difficult to assess due to impaired cognition     Mobility Bed Mobility Bed Mobility: Supine to Sit;Sitting - Scoot to Edge of Bed Supine to Sit: 1: +2 Total assist;With rails;HOB elevated Supine to Sit: Patient Percentage: 10% Sitting - Scoot to Edge of Bed: 1: +2 Total assist Sitting - Scoot to Edge of Bed: Patient Percentage: 0% Details for Bed Mobility Assistance: Assist to bring LEs and trunk OOB. Use of chuck pad to pivot pt's hips around to  EOB. Transfers Transfers: Sit to Stand;Stand to Sit Sit to Stand: 1: +  2 Total assist;From bed;With upper extremity assist Sit to Stand: Patient Percentage: 20% Stand to Sit: 1: +2 Total assist;To chair/3-in-1 Stand to Sit: Patient Percentage: 20% Details for Transfer Assistance: Assist for power up from bed. Facilitation throughout hips for upright posture and balance.     Exercise     Balance Balance Balance Assessed: Yes Static Sitting Balance Static Sitting - Balance Support: Feet supported;No upper extremity supported Static Sitting - Level of Assistance: 4: Min assist Static Sitting - Comment/# of Minutes: 10 minutes EOB   End of Session OT - End of Session Equipment Utilized During Treatment: Gait belt Activity Tolerance: Treatment limited secondary to agitation Patient left: in chair;with call bell/phone within reach Nurse Communication: Mobility status  GO   07/12/2013 Cipriano Mile OTR/L Pager [phone] Office [phone]   Cipriano Mile 07/12/2013, 11:12 AM

## 2013-07-12 NOTE — Progress Notes (Signed)
Family Medicine Teaching Service Daily Progress Note Intern Pager: [phone]  Patient name: [name] Medical record number: [id number] Date of birth: 1946/07/14 Age: 67 y.o. Gender: female  Primary Care Provider: No PCP Per Patient Consultants: Neurology, CCM Code Status: Full code  Pt Overview and Major Events to Date:  11/6: Admitted for seizure  11/8: Intermittent seizures continued despite vimpat, keppra, dilantin though seizures did not always correspond with EEG. Also febrile to 102, blood cx, urine cx and CXR ordered. Concern for UTI-Levaquin started 11/9: Vanc started for possible staph UTI 11/10: To get MRI, EEG. E. coli UTI on culture, intubated for propofol drip 11/12: Extubated 11/13: Transfer ICU to SDU  Assessment and Plan: [name] is a 67 y.o. female presenting with seizure . PMH is significant for seizure disorder, schizophrenia, CHF, HTN, diabetes, and Hepatitis A,B, and C.   # Seizure: Extubated yesterday, undergoing EEG monitoring.  - 4-drug management per neurology; will transition to po after swallow screen passed - PT/OT recommending SNF - Will D/C foley once tolerating po.   # SIRS: Fever, tachycardia, tachypnea. WBC 5.5 - Urine culture with >100k E. Coli, pan sensitive - Continue levaquin (11/9-)  - Blood culture (11/08): neg  # T2DM: No home medications. Hb A1c: 6.7, controlled without medications - continue CBG checks, no SSI at this time  # Hypertension & CHF: Systolic hypertension at baseline - Will recommend echo as outpatient - IVF at 100 cc/hr presently, watch closely for signs of fluid overload given hx of CHF; Up 9L from admission, per records.   Will D/C IVF once tolerating sufficient po  # Hypokalemia: repleted # Hypomagnesemia: repleted  # Anemia: Hgb 11.2 >>> 11.3 Stable # Psych: Continue home klonopin prn, home fluphenazine decanoate due to be given in >1 week.   FEN/GI: on TF, NS @ 100cc/hr Prophylaxis:  SCDs  Disposition: Transferring out of ICU to SDU, continued management there. Dispo will need to be to SNF.   Subjective: Alert, not oriented, not cooperative with exam or history. Visually tracks conjugate.   Objective: Temp:  [97.2 F (36.2 C)-99.4 F (37.4 C)] 98.2 F (36.8 C) (11/13 0752) Pulse Rate:  [83-108] 85 (11/13 0700) Resp:  [11-26] 18 (11/13 0700) BP: (111-175)/(58-86) 144/72 mmHg (11/13 0700) SpO2:  [99 %-100 %] 100 % (11/13 0700) FiO2 (%):  [30 %] 30 % (11/12 0906) Weight:  [148 lb 2.4 oz (67.2 kg)] 148 lb 2.4 oz (67.2 kg) (11/13 0500) Physical Exam: General: 67 yo female sitting in chair with NGT in NAD Neuro: Disoriented and agitated with mittens on, constant tremor of left foot + foley +PICC  Laboratory:  Recent Labs Lab 07/10/13 0545 07/11/13 0341 07/12/13 0515  WBC 5.1 5.4 5.4  HGB 10.3* 10.1* 11.3*  HCT 30.3* 29.9* 32.9*  PLT 128* 124* 148*    Recent Labs Lab 07/05/13 1251  07/07/13 1110  07/10/13 0545 07/11/13 0341 07/12/13 0515  NA 138  < > 136  < > 132* 137 141  K 4.5  < > 4.1  < > 3.3* 3.2* 3.6  CL 101  < > 102  < > 103 107 107  CO2 25  < > 21  < > 20 18* 17*  BUN 12  < > 16  < > 14 9 4*  CREATININE 0.44*  < > 0.46*  < > 0.41* 0.40* 0.37*  CALCIUM 10.1  < > 9.2  < > 8.4 8.5 9.4  PROT 8.1  --  7.4  --   --   --   --  BILITOT 0.2*  --  0.2*  --   --   --   --   ALKPHOS 169*  --  138*  --   --   --   --   ALT 23  --  20  --   --   --   --   AST 30  --  30  --   --   --   --   GLUCOSE 108*  < > 161*  < > 124* 129* 106*  < > = values in this interval not displayed. Urine micro few bacteria, 0-2 wbc  UDS + for benzodiazepines  Lactate 1.9  Trop neg  CBG 91  Phenytoin level: 20.3 > 15.9  CT Head:  No acute intracranial findings.  Chronic ischemic microvascular disease.   CXR;  Similar right lung base pleural thickening and scarring without  superimposed acute cardiopulmonary process.  EKG: sinus tachycardia  [name] Junker,  MD 07/12/2013, 8:48 AM PGY-1, [facility] Family Medicine FPTS Intern pager: [phone], text pages welcome

## 2013-07-12 NOTE — Clinical Social Work Placement (Addendum)
Clinical Social Work Department CLINICAL SOCIAL WORK PLACEMENT NOTE 07/12/2013  Patient:  [name],[name]  Account Number:  [id number] Admit date:  07/05/2013  Clinical Social Worker:  Macario Golds, LCSW  Date/time:  07/12/2013 03:00 PM  Clinical Social Work is seeking post-discharge placement for this patient at the following level of care:   SKILLED NURSING   (*CSW will update this form in Epic as items are completed)   07/12/2013  Patient/family provided with [facility] Department of Clinical Social Work's list of facilities offering this level of care within the geographic area requested by the patient (or if unable, by the patient's family).  07/12/2013  Patient/family informed of their freedom to choose among providers that offer the needed level of care, that participate in Medicare, Medicaid or managed care program needed by the patient, have an available bed and are willing to accept the patient.  07/12/2013  Patient/family informed of [facility]' ownership interest in [facility], as well as of the fact that they are under no obligation to receive care at this facility.  PASARR submitted to EDS on 07/12/2013 PASARR number received from EDS on   FL2 transmitted to all facilities in geographic area requested by pt/family on  07/12/2013 FL2 transmitted to all facilities within larger geographic area on   Patient informed that his/her managed care company has contracts with or will negotiate with  certain facilities, including the following:     Patient/family informed of bed offers received:  07/13/2013  Patient chooses bed at  [facility] Physician recommends and patient chooses bed at    Patient to be transferred to  [facility] ([facility] on  07/16/2013 ([facility]) Patient to be transferred to facility by [facility] ([facility])  The following physician request were entered in Epic:   Additional Comments: 11/13 Patient son requests that patient does not return to [facility]

## 2013-07-12 NOTE — Progress Notes (Signed)
PULMONARY  / CRITICAL CARE MEDICINE  Name: [name] MRN: [id number] DOB: [birth year]    ADMISSION DATE:  07/05/2013 CONSULTATION DATE:  07/06/2013  REFERRING MD :  Lelon Mast PRIMARY SERVICE: FMTS  CHIEF COMPLAINT:  Acute encephalopathy  BRIEF PATIENT DESCRIPTION: 67 yo NH resident admitted 11/6 with seizures.  On 11/7 patient had several seizures associated with brief apnea and fall in SpO2.  EEG 11/10 showed electrographic seizures that seem to emanate from the bifrontal regions and propagate to both cerebral hemispheres, pt transferred to neuro ICU and PCCM consulted for intubation.  SIGNIFICANT EVENTS / STUDIES:  11/6  Admitted with seizures 11/6  Head CT >>> nad 11/7  Seizures with apnea and desaturation.  PCCM consulted. 11/7  Continuous EEG >>> frequent sharp activity over the right hemisphere. This sharp activity does not always correspond to the clinical activity.  No evidence of electrographic seizures 11/8 Head CT >>> No evidence of acute intracranial abnormality 11/10 EEG >>> electrographic seizures that seem to emanate from the bifrontal regions and propagate to both cerebral hemispheres.  Pt transferred to neuro ICU and intubated. 11/11 24 hour EEG >>> status epilepticus resolved  LINES / TUBES: 11/9 PICC >>> 11/9 Foley>>> 11/10 ETT >>>11/12 11/10 OGT >>>11/12  CULTURES: Urine 11/8 >>> E.Coli Urine gram stain 11/8 >>> GPC in clusters Blood 11/8 >>> neg  ANTIBIOTICS: Levaquin 11/9 >>> Vanc 11/10 >>>11/12  SUBJECTIVE:  Patient awake and alert.  Still some mild confusion  VITAL SIGNS: Temp:  [97.2 F (36.2 C)-99.4 F (37.4 C)] 98.2 F (36.8 C) (11/13 0752) Pulse Rate:  [83-108] 85 (11/13 0700) Resp:  [11-26] 18 (11/13 0700) BP: (111-175)/(58-86) 144/72 mmHg (11/13 0700) SpO2:  [99 %-100 %] 100 % (11/13 0700) FiO2 (%):  [30 %] 30 % (11/12 0906) Weight:  [148 lb 2.4 oz (67.2 kg)] 148 lb 2.4 oz (67.2 kg) (11/13 0500)  HEMODYNAMICS:   VENTILATOR  SETTINGS: Vent Mode:  [-] CPAP;PSV FiO2 (%):  [30 %] 30 % PEEP:  [5 cmH20] 5 cmH20 Pressure Support:  [5 cmH20] 5 cmH20 INTAKE / OUTPUT: Intake/Output     11/12 0701 - 11/13 0700 11/13 0701 - 11/14 0700   I.V. (mL/kg) 1512.2 (22.5)    Other     NG/GT 65    IV Piggyback 460    Total Intake(mL/kg) 2037.2 (30.3)    Urine (mL/kg/hr) 3500 (2.2)    Emesis/NG output 30 (0)    Total Output 3530     Net -1492.8           PHYSICAL EXAMINATION: General:  Chronically ill appearing female, awake Neuro: awake and follows commands, moves extremities x 4 HEENT:  NCAT, PERRL, MMM. Cardiovascular:  RRR, no M/R/G. Lungs:  Resps even and unlabored.  CTA bilaterally. Abdomen:  BS x 4.  Soft, NT/ND. Musculoskeletal:  No gross deformities.  No edema. Skin:  Warm and dry.   Recent Labs Lab 07/05/13 1251 07/05/13 1257 07/05/13 1356  07/07/13 1110 07/08/13 0520  07/09/13 0500 07/09/13 1250 07/09/13 1935 07/10/13 0420 07/10/13 0545 07/11/13 0341 07/11/13 0545 07/11/13 1157 07/12/13 0515  HGB 12.9  --   --   < > 11.4* 11.2*  --  10.7*  --   --   --  10.3* 10.1*  --   --  11.3*  WBC 4.8  --   --   < > 4.7 7.2  --  5.5  --   --   --  5.1 5.4  --   --  5.4  PLT 210  --   --   < > 222 196  --  160  --   --   --  128* 124*  --   --  148*  NA 138  --   --   < > 136  --   < > 136 134*  --   --  132* 137  --   --  141  K 4.5  --   --   < > 4.1  --   < > 2.7* 4.1  --   --  3.3* 3.2*  --   --  3.6  CL 101  --   --   < > 102  --   < > 103 103  --   --  103 107  --   --  107  CO2 25  --   --   < > 21  --   < > 21 20  --   --  20 18*  --   --  17*  GLUCOSE 108*  --   --   < > 161*  --   < > 113* 109*  --   --  124* 129*  --   --  106*  BUN 12  --   --   < > 16  --   < > 14 15  --   --  14 9  --   --  4*  CREATININE 0.44*  --   --   < > 0.46*  --   < > 0.35* 0.37*  --   --  0.41* 0.40*  --   --  0.37*  CALCIUM 10.1  --   --   < > 9.2  --   < > 8.0* 8.7  --   --  8.4 8.5  --   --  9.4  MG  --   --   --    --   --   --   --  1.4* 1.6  --   --  1.4* 1.6  --   --  1.8  PHOS  --   --   --   --   --   --   --   --   --   --   --  2.2* 2.9  --   --  3.2  AST 30  --   --   --  30  --   --   --   --   --   --   --   --   --   --   --   ALT 23  --   --   --  20  --   --   --   --   --   --   --   --   --   --   --   ALKPHOS 169*  --   --   --  138*  --   --   --   --   --   --   --   --   --   --   --   BILITOT 0.2*  --   --   --  0.2*  --   --   --   --   --   --   --   --   --   --   --   PROT 8.1  --   --   --  7.4  --   --   --   --   --   --   --   --   --   --   --   ALBUMIN 4.0  --   --   --  3.5  --   --   --   --   --   --   --   --   --   --   --   LATICACIDVEN  --   --  1.90  --   --   --   --   --   --   --   --   --   --   --   --   --   TROPONINI  --  <0.30  --   --   --   --   --   --   --   --   --   --   --   --   --   --   PHART  --   --   --   --   --   --   --   --   --  7.474* 7.433  --   --  7.410 7.364  --   PCO2ART  --   --   --   --   --   --   --   --   --  29.0* 30.1*  --   --  31.2* 34.7*  --   PO2ART  --   --   --   --   --   --   --   --   --  261.0* 128.0*  --   --  127.0* 133.0*  --   HCO3  --   --   --   --   --   --   --   --   --  21.0 19.8*  --   --  19.5* 19.8*  --   O2SAT  --   --   --   --   --   --   --   --   --  99.8 98.9  --   --  98.7 99.0  --   < > = values in this interval not displayed.  Recent Labs Lab 07/11/13 1558 07/11/13 2025 07/12/13 0059 07/12/13 0411 07/12/13 0750  GLUCAP 93 95 97 95 87   CXR 11/12:  No acute findings.  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 - Supplemental O2 prn. - Sat goals >92%.  Sepsis - in the setting of UTI - positive UA/urine cultures. - Cont levaquin, switch to PO when diet begins. - Monitor WBC/fever curve.  Renal: hypo K - KVO fluids. -  Replace K. - BMET in AM.  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   - Neuro following.  - Cont Keppra and dilantin.  HTN, CHF - Per primary team.  DM - Per primary team  Carley Hammed Physician Assistant Student 07/12/13   Patient stable from a pulmonary standpoint.  PCCM okay to signoff to family medicine teaching service.  Have replaced K.    Patient seen and examined, agree with above note.  I dictated the care and orders written for this patient under my direction.  Alyson Reedy, MD [phone]

## 2013-07-12 NOTE — Evaluation (Signed)
Clinical/Bedside Swallow Evaluation Patient Details  Name: [name] MRN: [id number] Date of Birth: 1945/11/16  Today's Date: 07/12/2013 Time: 1500-1520 SLP Time Calculation (min): 20 min  Past Medical History:  Past Medical History  Diagnosis Date  . CHF (congestive heart failure)   . Seizures   . Schizophrenia   . Diabetes mellitus without complication   . Hypertension   . Asthma   . Hepatitis A   . Hepatitis B   . Hepatitis C   . Respiratory failure   . Smoker     1 pack daily   Past Surgical History: History reviewed. No pertinent past surgical history. HPI:  [name] is a 67 y.o. female presenting with seizure . PMH is significant for seizure disorder, schizophrenia, CHF, HTN, diabetes, and Hepatitis A,B, and C. CT head negative.    Assessment / Plan / Recommendation Clinical Impression  Repeat bedside swallow evaluation complete. Patient presents with s/s of aspiration primarily with thin liquids due to suspected delay in swallow initiation; suspect related to combination of intubation and AMS s/p seizures. Therapeutic intervention provided including use of maximum cueing for smaller sips size and altered consistencies to increase safety of swallow. Suspect rapid recovery of this acute reversible dysphagia with improvements in mentation and time off ventilator. WIll initiate a conservative diet and f/u at bedside for diagnostic treatment.     Aspiration Risk  Moderate    Diet Recommendation Dysphagia 3 (Mechanical Soft);Nectar-thick liquid   Liquid Administration via: Cup;No straw Medication Administration: Whole meds with puree Supervision: Patient able to self feed;Full supervision/cueing for compensatory strategies Compensations: Slow rate;Small sips/bites Postural Changes and/or Swallow Maneuvers: Seated upright 90 degrees    Other  Recommendations Oral Care Recommendations: Oral care BID Other Recommendations: Order thickener from  pharmacy;Prohibited food (jello, ice cream, thin soups);Remove water pitcher   Follow Up Recommendations  None    Frequency and Duration min 2x/week  2 weeks   Pertinent Vitals/Pain none     Swallow Study    General HPI: [name] is a 67 y.o. female presenting with seizure . PMH is significant for seizure disorder, schizophrenia, CHF, HTN, diabetes, and Hepatitis A,B, and C. CT head negative.  Type of Study: Bedside swallow evaluation Previous Swallow Assessment: previous BSE on 11/8 recommended NPO due to active seizures Diet Prior to this Study: NPO Temperature Spikes Noted: No Respiratory Status: Nasal cannula History of Recent Intubation: Yes Length of Intubations (days): 2 days Date extubated: 07/11/13 Behavior/Cognition: Alert;Cooperative;Requires cueing;Decreased sustained attention;Confused (verbally perseverative) Oral Cavity - Dentition: Edentulous Self-Feeding Abilities: Able to feed self;Needs assist Patient Positioning: Upright in chair Baseline Vocal Quality: Hoarse;Low vocal intensity;Wet Volitional Cough: Cognitively unable to elicit Volitional Swallow: Unable to elicit    Oral/Motor/Sensory Function Overall Oral Motor/Sensory Function: Appears within functional limits for tasks assessed   Ice Chips Ice chips: Within functional limits Presentation: Spoon   Thin Liquid Thin Liquid: Impaired Presentation: Cup;Straw Oral Phase Impairments: Reduced labial seal;Poor awareness of bolus Pharyngeal  Phase Impairments: Suspected delayed Swallow;Multiple swallows;Cough - Immediate    Nectar Thick Nectar Thick Liquid: Within functional limits Presentation: Cup   Honey Thick Honey Thick Liquid: Not tested   Puree Puree: Within functional limits Presentation: Spoon   Solid   GO   [name] Cater MA, CCC-SLP [phone]  Solid: Within functional limits Presentation: Spoon (softened)       [name] 07/12/2013,4:24 PM

## 2013-07-12 NOTE — Progress Notes (Addendum)
NUTRITION FOLLOW UP  Intervention:   Continue Vital AF 1.2 @ 50 ml/hr  TF provides 1440 kcal (96% of needs), 90 grams protein, and 973 ml H2O.   Nutrition Dx:   Inadequate oral intake related to inability to eat as evidenced by NPO status; ongoing.   Goal:   Pt to meet >/= 90% of their estimated nutrition needs   Monitor:   Diet advancement, TF tolerance, weight trend, labs   Assessment:   Pt admitted with AMS r/t to status epilepticus.  Pt extubated 11/12. Pt noted to be coughing with liquids therefore made NPO and NG tube placed. TF to be restarted. SLP to see pt today for swallow evaluation. If pt needs to continue enteral feedings based on swallow evaluation will adjust tube feeding.   Height: Ht Readings from Last 1 Encounters:  07/09/13 5\' 5"  (1.651 m)    Weight Status:   Wt Readings from Last 1 Encounters:  07/12/13 148 lb 2.4 oz (67.2 kg)  Admission weight 143 lb (65.2 kg)  Re-estimated needs:  Kcal: 1500-1700 Protein: 75-85 grams Fluid: > 1.5 L/day  Skin: no issues noted  Diet Order: NPO   Intake/Output Summary (Last 24 hours) at 07/12/13 0943 Last data filed at 07/12/13 0631  Gross per 24 hour  Intake 1702.5 ml  Output   3040 ml  Net -1337.5 ml    Last BM: 11/7   Labs:   Recent Labs Lab 07/10/13 0545 07/11/13 0341 07/12/13 0515  NA 132* 137 141  K 3.3* 3.2* 3.6  CL 103 107 107  CO2 20 18* 17*  BUN 14 9 4*  CREATININE 0.41* 0.40* 0.37*  CALCIUM 8.4 8.5 9.4  MG 1.4* 1.6 1.8  PHOS 2.2* 2.9 3.2  GLUCOSE 124* 129* 106*    CBG (last 3)   Recent Labs  07/12/13 0059 07/12/13 0411 07/12/13 0750  GLUCAP 97 95 87    Scheduled Meds: . antiseptic oral rinse  15 mL Mouth Rinse QID  . B-complex with vitamin C  1 tablet Oral Daily  . calcium carbonate  1 tablet Oral BID  . chlorhexidine  15 mL Mouth Rinse BID  . docusate  100 mg Per Tube BID  . lacosamide (VIMPAT) IV  200 mg Intravenous Q12H  . lamoTRIgine  150 mg Oral QHS  .  levETIRAcetam  1,000 mg Intravenous Q12H  . levofloxacin (LEVAQUIN) IV  250 mg Intravenous Q24H  . metoprolol  2.5 mg Intravenous Q6H  . pantoprazole (PROTONIX) IV  40 mg Intravenous Q24H  . phenytoin (DILANTIN) IV  100 mg Intravenous Q8H  . sodium chloride  10-40 mL Intracatheter Q12H  . sodium chloride  3 mL Intravenous Q12H  . thiamine  100 mg Oral Daily    Continuous Infusions: . sodium chloride 10 mL/hr at 07/11/13 2200  . dextrose 5 % and 0.9 % NaCl with KCl 20 mEq/L 100 mL/hr at 07/09/13 1545  . feeding supplement (VITAL AF 1.2 CAL) Stopped (07/11/13 0830)  . fentaNYL infusion INTRAVENOUS Stopped (07/11/13 1000)  . norepinephrine (LEVOPHED) Adult infusion    . propofol Stopped (07/11/13 0830)    Kendell Bane RD, LDN, CNSC [phone] Pager [phone] After Hours Pager

## 2013-07-13 DIAGNOSIS — R609 Edema, unspecified: Secondary | ICD-10-CM

## 2013-07-13 LAB — BASIC METABOLIC PANEL
BUN: 4 mg/dL — ABNORMAL LOW (ref 6–23)
CO2: 22 mEq/L (ref 19–32)
Calcium: 8.5 mg/dL (ref 8.4–10.5)
Chloride: 111 mEq/L (ref 96–112)
GFR calc Af Amer: >90 mL/min (ref >90)
GFR calc non Af Amer: >90 mL/min (ref >90)
Glucose, Bld: 130 mg/dL — ABNORMAL HIGH (ref 70–99)
Potassium: 3.2 mEq/L — ABNORMAL LOW (ref 3.5–5.1)
Sodium: 145 mEq/L (ref 135–145)

## 2013-07-13 LAB — GLUCOSE, CAPILLARY
Glucose-Capillary: 97 mg/dL (ref 70–99)
Glucose-Capillary: 127 mg/dL — ABNORMAL HIGH (ref 70–99)

## 2013-07-13 MED ORDER — LACOSAMIDE 200 MG PO TABS
200.0000 mg | ORAL_TABLET | Freq: Two times a day (BID) | ORAL | Status: DC
  Administered 2013-07-13 – 2013-07-16 (×6): 200 mg via ORAL
  Filled 2013-07-13 (×6): qty 1

## 2013-07-13 MED ORDER — ENSURE COMPLETE PO LIQD
237.0000 mL | Freq: Two times a day (BID) | ORAL | Status: DC
  Administered 2013-07-13 – 2013-07-15 (×4): 237 mL via ORAL

## 2013-07-13 MED ORDER — ENOXAPARIN SODIUM 80 MG/0.8ML ~~LOC~~ SOLN
1.0000 mg/kg | Freq: Two times a day (BID) | SUBCUTANEOUS | Status: DC
  Administered 2013-07-13 – 2013-07-15 (×5): 70 mg via SUBCUTANEOUS
  Filled 2013-07-13 (×8): qty 0.8

## 2013-07-13 MED ORDER — LEVETIRACETAM 500 MG PO TABS
1000.0000 mg | ORAL_TABLET | Freq: Two times a day (BID) | ORAL | Status: DC
  Administered 2013-07-13 – 2013-07-16 (×6): 1000 mg via ORAL
  Filled 2013-07-13 (×7): qty 2

## 2013-07-13 MED ORDER — SODIUM CHLORIDE 0.9 % IV SOLN
INTRAVENOUS | Status: DC
  Administered 2013-07-13: 23:00:00 via INTRAVENOUS

## 2013-07-13 MED ORDER — LORAZEPAM 2 MG/ML IJ SOLN: 1.0000 mg | INTRAMUSCULAR | Status: DC | PRN

## 2013-07-13 MED ORDER — POTASSIUM CHLORIDE CRYS ER 20 MEQ PO TBCR
40.0000 meq | EXTENDED_RELEASE_TABLET | Freq: Once | ORAL | Status: AC
  Administered 2013-07-13: 40 meq via ORAL
  Filled 2013-07-13: qty 2

## 2013-07-13 MED ORDER — PANTOPRAZOLE SODIUM 40 MG PO TBEC
40.0000 mg | DELAYED_RELEASE_TABLET | Freq: Every day | ORAL | Status: DC
  Administered 2013-07-14 – 2013-07-16 (×3): 40 mg via ORAL
  Filled 2013-07-13 (×3): qty 1

## 2013-07-13 MED ORDER — PHENYTOIN SODIUM EXTENDED 100 MG PO CAPS
100.0000 mg | ORAL_CAPSULE | Freq: Three times a day (TID) | ORAL | Status: DC
  Administered 2013-07-13 – 2013-07-15 (×5): 100 mg via ORAL
  Filled 2013-07-13 (×7): qty 1

## 2013-07-13 MED ORDER — LEVOFLOXACIN 250 MG PO TABS
250.0000 mg | ORAL_TABLET | Freq: Every day | ORAL | Status: AC
  Administered 2013-07-13 – 2013-07-14 (×2): 250 mg via ORAL
  Filled 2013-07-13 (×2): qty 1

## 2013-07-13 MED ORDER — PHENYTOIN 50 MG PO CHEW
100.0000 mg | CHEWABLE_TABLET | Freq: Three times a day (TID) | ORAL | Status: DC
  Filled 2013-07-13 (×2): qty 2

## 2013-07-13 NOTE — Clinical Social Work Note (Signed)
Clinical Social Worker continuing to follow patient and family for support and discharge planning needs.  CSW spoke with patient son over the phone to provide bed offers.  Patient son has chosen [facility] - CSW contacted facility who is aware of patient son wishes.  CSW has attempted to contact [facility] to update on patient plans at discharge. CSW remains available for support and to facilitate patient needs once medically ready.  Macario Golds, [place] [phone]

## 2013-07-13 NOTE — Progress Notes (Signed)
Speech Language Pathology Treatment: Dysphagia  Patient Details Name: SELYNA KLAHN MRN: [id number] DOB: Aug 04, 1946 Today's Date: 07/13/2013 Time: 7829-5621 SLP Time Calculation (min): 13 min  Assessment / Plan / Recommendation Clinical Impression  Treatment focused on diet tolerance and diagnostic po trials for potential upgrade. Per RN, patient refusing to drink liquids via cup however consumed nectar thick liquid via straw without overt s/s of aspiration. SLP provided max encouragement and cueing for intake of diagnostic liquid trials. Patient remains confused, perseverative. Although intake minimal, patient without overt indication of aspiration with thin liquid trials. Vocal quality improving with more time off ventilator. Patient refused solids. Recommend advance to thin liquids. Straws ok as long as patient taking small, single sips. SLP will f/u to monitor for tolerance of new diet.    HPI HPI: PATRECIA VEIGA is a 67 y.o. female presenting with seizure . PMH is significant for seizure disorder, schizophrenia, CHF, HTN, diabetes, and Hepatitis A,B, and C. CT head negative.       SLP Plan  Continue with current plan of care    Recommendations Diet recommendations: Dysphagia 3 (mechanical soft);Thin liquid Liquids provided via: Cup;Straw Medication Administration: Whole meds with puree Supervision: Patient able to self feed;Full supervision/cueing for compensatory strategies Compensations: Slow rate;Small sips/bites Postural Changes and/or Swallow Maneuvers: Seated upright 90 degrees              Oral Care Recommendations: Oral care BID Follow up Recommendations: None Plan: Continue with current plan of care    GO   [facility] MA, CCC-SLP [phone]   Kathia Covington Meryl 07/13/2013, 2:24 PM

## 2013-07-13 NOTE — Progress Notes (Signed)
Family medicine notified of patient's unwillingness to take PO meds. OK to push K back and try again later per MD. IV dilantin given for 1400 dose. Will continue to assess.

## 2013-07-13 NOTE — Progress Notes (Signed)
Family Medicine Teaching Service Daily Progress Note Intern Pager: [phone]  Patient name: [name] Medical record number: [id number] Date of birth: 1945-11-16 Age: 67 y.o. Gender: female  Primary Care Provider: No PCP Per Patient Consultants: Neurology, CCM Code Status: Full code  Pt Overview and Major Events to Date:  11/6: Admitted for seizure  11/8: Intermittent seizures continued despite vimpat, keppra, dilantin though seizures did not always correspond with EEG. Also febrile to 102, blood cx, urine cx and CXR ordered. Concern for UTI-Levaquin started 11/9: Vanc started for possible staph UTI 11/10: To get MRI, EEG. E. coli UTI on culture, intubated for propofol drip 11/12: Extubated 11/13: SDU status in ICU 11/14: Transfer to telemetry  Assessment and Plan: TYREE VANDRUFF is a 67 y.o. female presenting with seizure . PMH is significant for seizure disorder, schizophrenia, CHF, HTN, diabetes, and Hepatitis A,B, and C.   # Seizure: Extubated 11/12 - 4-drug management per neurology; will transition to po today - PT/OT recommending SNF; CSW seeking placement  # SIRS: Fever, tachycardia, tachypnea. WBC 5.5 - Urine culture with >100k E. Coli, pan sensitive - Continue levaquin (11/9-) 11/15 is last dose.  - Blood culture (11/08): neg  # T2DM: No home medications. Hb A1c: 6.7, controlled without medications - continue CBG checks, no SSI at this time  # Hypertension & CHF: Systolic hypertension at baseline; up 11L from admission, thought to be incomplete charted output. No pedal edema - Will recommend echo as outpatient - SLIV since taking good po  # Hypokalemia: repleted; orally repleting again with KDUR daily, recheck in AM # Hypomagnesemia: repleted  # Anemia: Hgb 11.2 >>> 11.3 Stable # Psych: Continue home klonopin prn, home fluphenazine decanoate due to be given in >1 week.   FEN/GI: Dysphagia 3, mechanical thick liquid, NS @ 100cc/hr Prophylaxis:  SCDs  Disposition: Transferring out of ICU to SDU, continued management there. FL2's sent out to SNFs  Subjective: Alert, not oriented, not cooperative with exam or history. Visually tracks conjugate.   Objective: Temp:  [97.8 F (36.6 C)-98.5 F (36.9 C)] 97.8 F (36.6 C) (11/14 0807) Pulse Rate:  [68-111] 87 (11/14 0800) Resp:  [0-23] 17 (11/14 0800) BP: (126-167)/(61-111) 145/88 mmHg (11/14 0800) SpO2:  [98 %-100 %] 100 % (11/14 0800) Weight:  [149 lb 11.1 oz (67.9 kg)] 149 lb 11.1 oz (67.9 kg) (11/14 0453) Physical Exam: General: 67 yo female sitting in chair with NGT in NAD CV: rrr, no murmur, no JVD, no edema Pulm: Non-labored, CTAB Abd: soft, NT, ND Neuro: Disoriented and agitated with mittens on, constant tremor of left foot +PICC  Laboratory:  Recent Labs Lab 07/10/13 0545 07/11/13 0341 07/12/13 0515  WBC 5.1 5.4 5.4  HGB 10.3* 10.1* 11.3*  HCT 30.3* 29.9* 32.9*  PLT 128* 124* 148*    Recent Labs Lab 07/07/13 1110  07/11/13 0341 07/12/13 0515 07/13/13 0500  NA 136  < > 137 141 145  K 4.1  < > 3.2* 3.6 3.2*  CL 102  < > 107 107 111  CO2 21  < > 18* 17* 22  BUN 16  < > 9 4* 4*  CREATININE 0.46*  < > 0.40* 0.37* 0.34*  CALCIUM 9.2  < > 8.5 9.4 8.5  PROT 7.4  --   --   --   --   BILITOT 0.2*  --   --   --   --   ALKPHOS 138*  --   --   --   --  ALT 20  --   --   --   --   AST 30  --   --   --   --   GLUCOSE 161*  < > 129* 106* 130*  < > = values in this interval not displayed. Urine micro few bacteria, 0-2 wbc  UDS + for benzodiazepines  Lactate 1.9  Trop neg  CBG 91  Phenytoin level: 20.3 > 15.9  CT Head:  No acute intracranial findings.  Chronic ischemic microvascular disease.   CXR;  Similar right lung base pleural thickening and scarring without  superimposed acute cardiopulmonary process.  EKG: sinus tachycardia  Hazeline Junker, MD 07/13/2013, 9:05 AM PGY-1, [facility] Family Medicine FPTS Intern pager: [phone], text pages  welcome

## 2013-07-13 NOTE — Clinical Documentation Improvement (Signed)
THIS DOCUMENT IS NOT A PERMANENT PART OF THE MEDICAL RECORD  Please update your documentation with the medical record to reflect your response to this query. If you need help knowing how to do this please call [phone].  07/13/13   Dr. Jarvis Newcomer,  In a better effort to capture your patient's severity of illness, reflect appropriate length of stay and utilization of resources, a review of the patient medical record has revealed the following information:    - Critical Care Medicine documented "Sepsis in the setting of UTI - positive UA/urine cultures" starting with          progress note on 07/09/13   - "SIRS" documented in [facility] Medicine Teaching Service progress  notes   Based on your clinical judgment, please document in the progress notes and discharge summary which diagnosis is applicable to this admission.   In responding to this query please exercise your independent judgment.    The fact that a query is asked, does not imply that any particular answer is desired or expected.  The documentation of Possible, Probable, Suspected or Likely conditions is permitted with inpatient admissions.  However Possible, Probable Suspected or Likely conditions must be documented as such in the discharge summary, unless ruled out or confirmed during the admission.   Reviewed: additional documentation in the medical record - see my note 11/15, correct diagnosis is sepsis secondary to UTI, now resolved.  Bobbye Morton, MD PGY-2, [facility] 07/14/2013, 10:09 AM FPTS Service pager: [phone] (text pages welcome through [facility])

## 2013-07-13 NOTE — Progress Notes (Signed)
ANTICOAGULATION CONSULT NOTE - Initial Consult  Pharmacy Consult for Lovenox Indication: DVT  Allergies  Allergen Reactions  . Depakote [Valproic Acid]   . Haldol [Haloperidol Lactate]   . Penicillins   . Shellfish Allergy   . Trileptal [Oxcarbazepine]     Patient Measurements: Height: 5\' 5"  (165.1 cm) Weight: 149 lb 11.1 oz (67.9 kg) IBW/kg (Calculated) : 57   Vital Signs: Temp: 97.6 F (36.4 C) (11/14 1700) Temp src: Oral (11/14 1700) BP: 139/64 mmHg (11/14 1700) Pulse Rate: 76 (11/14 1700)  Labs:  Recent Labs  07/11/13 0341 07/12/13 0515 07/13/13 0500  HGB 10.1* 11.3*  --   HCT 29.9* 32.9*  --   PLT 124* 148*  --   CREATININE 0.40* 0.37* 0.34*    Estimated Creatinine Clearance: 63.1 ml/min (by C-G formula based on Cr of 0.34).   Medical History: Past Medical History  Diagnosis Date  . CHF (congestive heart failure)   . Seizures   . Schizophrenia   . Diabetes mellitus without complication   . Hypertension   . Asthma   . Hepatitis A   . Hepatitis B   . Hepatitis C   . Respiratory failure   . Smoker     1 pack daily    Assessment: 67yof admitted with Sz and found to have RUE DVT today.  Plan is to start anticoagulation treatment dose Lovenox 1mg /kg = 70mg  q12hr.  Last CBC stable, CrCl > 13ml/min, no bleeding noted.  Goal of Therapy:  Anti-Xa level 0.6-1.2 units/ml 4hrs after LMWH dose given Monitor platelets by anticoagulation protocol: Yes   Plan:  Lovenox 1mg /kg = 70mg  q12 Weekly CBC and Bmet  Leota Sauers Pharm.D. CPP, BCPS Clinical Pharmacist [phone] 07/13/2013 7:38 PM

## 2013-07-13 NOTE — Progress Notes (Signed)
Family medicine called regarding patient having increased swelling in RUE. Patient has DL PICC that is functioning well with good blood return. MD will order [place] to assess extremity. Will continue to monitor.

## 2013-07-13 NOTE — Progress Notes (Signed)
NUTRITION FOLLOW UP  Intervention:    Ensure Complete po BID, each supplement provides 350 kcal and 13 grams of protein.  Nutrition Dx:   Inadequate oral intake now related to dysphagia as evidenced by Meal Completion: 50%; ongoing.    Goal:   Pt to meet >/= 90% of their estimated nutrition needs; not met.   Monitor:   Diet advancement, PO intake, weight trend, labs   Assessment:   Pt admitted with AMS r/t to status epilepticus.  Pt extubated 11/12. Dysphagia diet started.  Per RN pt has been doing really well eating but will only drink from a straw. Potassium is low and being repleted.   Height: Ht Readings from Last 1 Encounters:  07/09/13 5\' 5"  (1.651 m)    Weight Status:   Wt Readings from Last 1 Encounters:  07/13/13 149 lb 11.1 oz (67.9 kg)  Admission weight 143 lb (65.2 kg)  Re-estimated needs:  Kcal: 1500-1700 Protein: 75-85 grams Fluid: > 1.5 L/day  Skin: no issues noted  Diet Order: Dysphagia 3 with Nectar Thick Liquids Meal Completion: 50%   Intake/Output Summary (Last 24 hours) at 07/13/13 1259 Last data filed at 07/13/13 1200  Gross per 24 hour  Intake   2413 ml  Output      0 ml  Net   2413 ml    Last BM: 11/14   Labs:   Recent Labs Lab 07/10/13 0545 07/11/13 0341 07/12/13 0515 07/13/13 0500  NA 132* 137 141 145  K 3.3* 3.2* 3.6 3.2*  CL 103 107 107 111  CO2 20 18* 17* 22  BUN 14 9 4* 4*  CREATININE 0.41* 0.40* 0.37* 0.34*  CALCIUM 8.4 8.5 9.4 8.5  MG 1.4* 1.6 1.8  --   PHOS 2.2* 2.9 3.2  --   GLUCOSE 124* 129* 106* 130*    CBG (last 3)   Recent Labs  07/13/13 07/13/13 0410 07/13/13 0803  GLUCAP 97 127* 93    Scheduled Meds: . B-complex with vitamin C  1 tablet Oral Daily  . calcium carbonate  1 tablet Oral BID  . docusate  100 mg Per Tube BID  . lacosamide (VIMPAT) IV  200 mg Intravenous Q12H  . lamoTRIgine  150 mg Oral QHS  . levETIRAcetam  1,000 mg Intravenous Q12H  . levofloxacin (LEVAQUIN) IV  250 mg  Intravenous Q24H  . metoprolol tartrate  25 mg Oral BID  . pantoprazole  40 mg Oral Daily  . phenytoin (DILANTIN) IV  100 mg Intravenous Q8H  . potassium chloride  40 mEq Oral Once  . sodium chloride  3 mL Intravenous Q12H  . thiamine  100 mg Oral Daily    Continuous Infusions: . sodium chloride 100 mL/hr at 07/12/13 [address], LDN, CNSC [phone] Pager [phone] After Hours Pager

## 2013-07-13 NOTE — Progress Notes (Addendum)
Right upper extremity venous duplex completed.  Right:  DVT noted in the subclavian and axillary veins with superficial thrombus in the basilic vein.  Left:  Negative for DVT in the subclavian vein.

## 2013-07-14 DIAGNOSIS — N390 Urinary tract infection, site not specified: Secondary | ICD-10-CM | POA: Diagnosis present

## 2013-07-14 DIAGNOSIS — G40909 Epilepsy, unspecified, not intractable, without status epilepticus: Secondary | ICD-10-CM | POA: Diagnosis present

## 2013-07-14 DIAGNOSIS — F209 Schizophrenia, unspecified: Secondary | ICD-10-CM | POA: Diagnosis present

## 2013-07-14 DIAGNOSIS — A419 Sepsis, unspecified organism: Secondary | ICD-10-CM | POA: Clinically undetermined

## 2013-07-14 DIAGNOSIS — I10 Essential (primary) hypertension: Secondary | ICD-10-CM | POA: Diagnosis present

## 2013-07-14 LAB — CULTURE, BLOOD (ROUTINE X 2)
Culture: NO GROWTH
Culture: NO GROWTH

## 2013-07-14 LAB — BASIC METABOLIC PANEL
BUN: 5 mg/dL — ABNORMAL LOW (ref 6–23)
Calcium: 9.3 mg/dL (ref 8.4–10.5)
Creatinine, Ser: 0.35 mg/dL — ABNORMAL LOW (ref 0.50–1.10)
GFR calc Af Amer: >90 mL/min (ref >90)
GFR calc non Af Amer: >90 mL/min (ref >90)
Glucose, Bld: 101 mg/dL — ABNORMAL HIGH (ref 70–99)
Potassium: 3.7 mEq/L (ref 3.5–5.1)

## 2013-07-14 LAB — GLUCOSE, CAPILLARY
Glucose-Capillary: 89 mg/dL (ref 70–99)
Glucose-Capillary: 86 mg/dL (ref 70–99)
Glucose-Capillary: 136 mg/dL — ABNORMAL HIGH (ref 70–99)
Glucose-Capillary: 87 mg/dL (ref 70–99)

## 2013-07-14 MED ORDER — ENOXAPARIN SODIUM 80 MG/0.8ML ~~LOC~~ SOLN: 1.0000 mg/kg | Freq: Two times a day (BID) | SUBCUTANEOUS | Status: DC

## 2013-07-14 MED ORDER — FLUPHENAZINE DECANOATE 25 MG/ML IJ SOLN
25.0000 mg | INTRAMUSCULAR | Status: DC
  Filled 2013-07-14 (×2): qty 1

## 2013-07-14 MED ORDER — LEVETIRACETAM 1000 MG PO TABS: 1000.0000 mg | ORAL_TABLET | Freq: Two times a day (BID) | ORAL | Status: AC

## 2013-07-14 MED ORDER — LACOSAMIDE 200 MG PO TABS: 200.0000 mg | ORAL_TABLET | Freq: Two times a day (BID) | ORAL | Status: DC

## 2013-07-14 MED ORDER — PHENYTOIN SODIUM EXTENDED 100 MG PO CAPS: 100.0000 mg | ORAL_CAPSULE | Freq: Three times a day (TID) | ORAL | Status: DC

## 2013-07-14 NOTE — Progress Notes (Signed)
Family Medicine Teaching Service Daily Progress Note Intern Pager: [phone]  Patient name: [name] Medical record number: [id number] Date of birth: [birth year] Age: 67 y.o. Gender: female  Primary Care Provider: No PCP Per Patient Consultants: Neurology, CCM Code Status: Full code  Pt Overview and Major Events to Date:  11/6: Admitted for seizure, nonverbal. CT head showed no acute abnormality  11/7: CCM consulted for question of apneic events and desats with seizure, did not require ICU transfer at that point  11/8: Intermittent seizures continued despite vimpat, keppra, dilantin though seizures did not always correspond with EEG. Also febrile to 102, blood cx, urine cx and CXR ordered. Concern for UTI-Levaquin started  11/10: Vanc started for possible ?staph UTI (Gram stain with GPC in clusters, NOT seen on culture)  11/10: E. coli UTI on culture, neurologist recommended intubatation for airway protection and propofol drip  11/11: 24h EEG with status epilepticus resolved  11/12: Extubated, stopped Vanc  11/13: SDU status in ICU for monitoring, no further seizure noted  11/14: Transfer to telemetry, right UE PICC DVT noted, started on Lovenox treatment dose  11/15: Improved, PICC to be removed, medically stable for discharge (awaiting SNF bed)   Assessment and Plan: MALORIE BIGFORD is a 67 y.o. female presenting with seizure . PMH is significant for seizure disorder, schizophrenia, CHF, HTN, diabetes, and Hepatitis A,B, and C.   # Seizure: Extubated 11/12 - 4-drug management per neurology, transitioned to PO 11/14 - continue Vimpat 200 mg BID, Lamictal 150 mg qHS, Dilantin ER 100 mg TID, Keppra 1g BID - PT/OT recommending SNF - bed offer at [facility], with dispo pending facility ability to accept pt  # Right upper extremity DVT - no known history of DVT, and likely provoked in setting of PICC line - started Lovenox at treatment dose, plan to continue for 3 months - discussed  with pharmacy - not a good candidate for PO novel agent with multiple seizure meds and history of hepatitis   # Sepsis - resolved: (clarification of documentation) secondary to urinary tract infection; initially considered to be SIRS but with identified nidus of infection, considered sepsis - Urine culture with >100k E. Coli, pan sensitive - Continue levaquin (11/15 is last dose) - Blood culture (11/08): neg  # T2DM: No home medications. Hb A1c: 6.7, controlled without medications - continue CBG checks, no SSI at this time  # Hypertension & CHF: Systolic hypertension at baseline; uncertain exact nature of heart failure, not on diuretic at baseline - up 11L from admission, thought to be incomplete charted output. No significant pedal edema - Will recommend echo as outpatient  # Hypokalemia: repleted / resolved # Hypomagnesemia: repleted  # Anemia: Hgb 11.2 >>> 11.3 Stable # Psych: Continue home klonopin prn, home fluphenazine decanoate 25 mg q14 days (depo shot, due 11/17)  FEN/GI: Dysphagia 3, saline lock IV Prophylaxis: SCDs  Disposition: Management as above. Medically stable for discharge to SNF when able to accept pt.  Subjective: Alert, oriented to self / place but not time. Denies specific complaint (vaguely describes "pain in my bottom," but then later states she does not have pain but has loose stool, but has not had diarrhea per nursing).    Objective: Temp:  [97.6 F (36.4 C)-98.4 F (36.9 C)] 98.4 F (36.9 C) (11/15 0459) Pulse Rate:  [73-99] 99 (11/15 0459) Resp:  [16-21] 20 (11/15 0459) BP: (135-171)/(61-96) 140/68 mmHg (11/15 0500) SpO2:  [100 %] 100 % (11/15 0459) Weight:  [295  lb 8 oz (71.895 kg)] 158 lb 8 oz (71.895 kg) (11/15 0459) Physical Exam: General: adult female, lying in bed in NAD CV: rrr, no murmur, no JVD, no edema Pulm: CTAB, normal WOB Abd: soft, NT, ND, BS+ Neuro: alert, oriented x2 (not to time), exam non-focal Psych: vigilant but not agitated  and not actively psychotic, no obvious hallucination Ext: right UE PICC in place with small amount of induration, but no redness, drainage, or frank tenderness  Laboratory:  Recent Labs Lab 07/10/13 0545 07/11/13 0341 07/12/13 0515  WBC 5.1 5.4 5.4  HGB 10.3* 10.1* 11.3*  HCT 30.3* 29.9* 32.9*  PLT 128* 124* 148*    Recent Labs Lab 07/07/13 1110  07/12/13 0515 07/13/13 0500 07/14/13 0605  NA 136  < > 141 145 144  K 4.1  < > 3.6 3.2* 3.7  CL 102  < > 107 111 110  CO2 21  < > 17* 22 27  BUN 16  < > 4* 4* 5*  CREATININE 0.46*  < > 0.37* 0.34* 0.35*  CALCIUM 9.2  < > 9.4 8.5 9.3  PROT 7.4  --   --   --   --   BILITOT 0.2*  --   --   --   --   ALKPHOS 138*  --   --   --   --   ALT 20  --   --   --   --   AST 30  --   --   --   --   GLUCOSE 161*  < > 106* 130* 101*  < > = values in this interval not displayed. UDS + for benzodiazepines   Micro: as above  CT Head 11/8:  No acute intracranial findings.  Chronic ischemic microvascular disease.   Bobbye Morton, MD 07/14/2013, 9:47 AM PGY-2, [facility] FPTS Intern pager: [phone], text pages welcome

## 2013-07-14 NOTE — Progress Notes (Signed)
Pt is refusing IV access. Pt was told the importance of having an IV. Pt swings her arm whenever IV is attempted. Md on call made aware. New order received to leave in pt's PICC and have fluids run at [facility]. Pt currently resting in bed. Will cont to monitor pt.

## 2013-07-14 NOTE — Clinical Social Work Note (Signed)
CSW advised by MD that patient ready for discharge today back to [facility] skilled nursing facility. CSW contacted facility (and message left) but was unable to talk with a staff person to confirm patient's return to facility. CSW working on Sunday will follow-up with facility regarding d/c on Sunday, 11/16.  Genelle Bal, MSW, LCSW [phone]

## 2013-07-14 NOTE — Progress Notes (Signed)
Attending Addendum  I examined the patient and discussed the assessment and plan with Dr. Casper Harrison. I have reviewed the note and agree.  Patient medically stable for SNF placement.     Dessa Phi, MD FAMILY MEDICINE TEACHING SERVICE

## 2013-07-14 NOTE — Progress Notes (Addendum)
Speech Language Pathology Treatment: Dysphagia  Patient Details Name: [name] MRN: [id number] DOB: 01/29/1946 Today's Date: 07/14/2013 Time: 1000-1017 SLP Time Calculation (min): 17 min  Assessment / Plan / Recommendation Clinical Impression  Pt seen to assess tolerance of po diet, soft/thin.  Per RN, pt with poor intake at breakfast.  Pt leaning to left without awareness, SLP helped to reposition pt.  Observed pt to self feed peaches and applejuice, slow but effective mastication.  Cough at baseline appeared mildly congested and not elicited with po intake.    Rec continue soft diet due to edentulous status.  SLP to follow up one more time to assure tolerance of po diet.  Educated pt to importance of slow intake.    HPI HPI: KIFFANY [name] is a 67 y.o. female presenting with seizure . PMH is significant for seizure disorder, schizophrenia, CHF, HTN, diabetes, and Hepatitis A,B, and C. CT head negative.   Pt required intubation,extubated 11/12.  today pt seen to assess tolerance of diet - soft/thin.     Pertinent Vitals Afebrile, decreased  SLP Plan  Continue with current plan of care    Recommendations Diet recommendations: Dysphagia 3 (mechanical soft);Thin liquid Liquids provided via: Cup;Straw Medication Administration: Whole meds with puree Supervision: Patient able to self feed;Full supervision/cueing for compensatory strategies Compensations: Slow rate;Small sips/bites;Check for pocketing Postural Changes and/or Swallow Maneuvers: Seated upright 90 degrees;Upright 30-60 min after meal;Out of bed for meals              Oral Care Recommendations: Oral care BID Follow up Recommendations: None Plan: Continue with current plan of care    GO     [name] Burnet, MS [facility] SLP [phone]

## 2013-07-15 DIAGNOSIS — G40909 Epilepsy, unspecified, not intractable, without status epilepticus: Secondary | ICD-10-CM

## 2013-07-15 DIAGNOSIS — F209 Schizophrenia, unspecified: Secondary | ICD-10-CM

## 2013-07-15 LAB — GLUCOSE, CAPILLARY
Glucose-Capillary: 119 mg/dL — ABNORMAL HIGH (ref 70–99)
Glucose-Capillary: 125 mg/dL — ABNORMAL HIGH (ref 70–99)
Glucose-Capillary: 117 mg/dL — ABNORMAL HIGH (ref 70–99)

## 2013-07-15 MED ORDER — CLONAZEPAM 0.5 MG PO TABS: 0.5000 mg | ORAL_TABLET | Freq: Two times a day (BID) | ORAL | Status: DC | PRN

## 2013-07-15 MED ORDER — PHENYTOIN SODIUM EXTENDED 100 MG PO CAPS
200.0000 mg | ORAL_CAPSULE | Freq: Two times a day (BID) | ORAL | Status: DC
  Filled 2013-07-15: qty 2

## 2013-07-15 MED ORDER — DOCUSATE SODIUM 100 MG PO CAPS
100.0000 mg | ORAL_CAPSULE | Freq: Two times a day (BID) | ORAL | Status: DC
  Administered 2013-07-15 – 2013-07-16 (×3): 100 mg via ORAL
  Filled 2013-07-15 (×4): qty 1

## 2013-07-15 MED ORDER — PHENYTOIN SODIUM EXTENDED 100 MG PO CAPS
200.0000 mg | ORAL_CAPSULE | Freq: Three times a day (TID) | ORAL | Status: DC
  Administered 2013-07-15 (×2): 200 mg via ORAL
  Filled 2013-07-15 (×2): qty 2

## 2013-07-15 NOTE — Progress Notes (Signed)
Family Medicine Teaching Service Daily Progress Note Intern Pager: [phone]  Patient name: [name] Medical record number: [id number] Date of birth: 09-01-1945 Age: 67 y.o. Gender: female  Primary Care Provider: No PCP Per Patient Consultants: Neurology, CCM Code Status: Full code  Pt Overview and Major Events to Date:  11/6: Admitted for seizure, nonverbal. CT head showed no acute abnormality  11/7: CCM consulted for question of apneic events and desats with seizure, did not require ICU transfer at that point  11/8: Intermittent seizures continued despite vimpat, keppra, dilantin though seizures did not always correspond with EEG. Also febrile to 102, blood cx, urine cx and CXR ordered. Concern for UTI-Levaquin started  11/10: Vanc started for possible ?staph UTI (Gram stain with GPC in clusters, NOT seen on culture)  11/10: E. coli UTI on culture, neurologist recommended intubatation for airway protection and propofol drip  11/11: 24h EEG with status epilepticus resolved  11/12: Extubated, stopped Vanc  11/13: SDU status in ICU for monitoring, no further seizure noted  11/14: Transfer to telemetry, right UE PICC DVT noted, started on Lovenox treatment dose  11/15: Improved, PICC to be removed, medically stable for discharge (awaiting SNF bed)  11/16: SNF bed available, d/c today  Assessment and Plan: [name] is a 66 y.o. female presenting with seizure . PMH is significant for seizure disorder, schizophrenia, CHF, HTN, diabetes, and Hepatitis A,B, and C.   # Seizure: Extubated 11/12 - 4-drug management per neurology, transitioned to PO 11/14 - continue Vimpat 200 mg BID, Lamictal 150 mg qHS, Dilantin ER 100 mg TID, Keppra 1g BID - PT/OT recommending SNF - bed offer at [facility], has bed so will d/c there today  # Right upper extremity DVT - no known history of DVT, and likely provoked in setting of PICC line - started Lovenox at treatment dose, plan to continue for 3  months - discussed with pharmacy - not a good candidate for PO novel agent with multiple seizure meds and history of hepatitis   # Sepsis - resolved: (clarification of documentation) secondary to urinary tract infection; initially considered to be SIRS but with identified nidus of infection, considered sepsis - Urine culture with >100k E. Coli, pan sensitive - levaquin course completed - Blood culture (11/08): neg  # T2DM: No home medications. Hb A1c: 6.7, controlled without medications - continue CBG checks, no SSI at this time  # Hypertension & CHF: Systolic hypertension at baseline; uncertain exact nature of heart failure, not on diuretic at baseline - up 11L from admission, thought to be incomplete charted output. No significant pedal edema - Will recommend echo as outpatient  # Hypokalemia: repleted / resolved # Hypomagnesemia: repleted  # Anemia: Hgb 11.2 >>> 11.3 Stable # Psych: Continue home klonopin prn, home fluphenazine decanoate 25 mg q14 days (depo shot, due 11/17)  FEN/GI: Dysphagia 3, saline lock IV Prophylaxis: SCDs  Disposition: Medically stable for discharge to SNF today  Subjective: Alert, oriented to self / place but not time. Denies specific complaint (vaguely describes "pain in my bottom," but then later states she does not have pain but has loose stool, but has not had diarrhea per nursing).    Objective: Temp:  [97.8 F (36.6 C)-98.3 F (36.8 C)] 98.2 F (36.8 C) (11/16 0544) Pulse Rate:  [80-86] 86 (11/16 0544) Resp:  [16-19] 16 (11/16 0544) BP: (117-132)/(68-89) 132/68 mmHg (11/16 0544) SpO2:  [100 %] 100 % (11/16 0544) Physical Exam: General: adult female, sitting up in  chair in NAD CV: rrr, no murmur, no JVD, no edema of lower extremities Pulm: CTAB, normal WOB Abd: soft, NT, ND, BS+ Neuro: sleeping, awakens to voice, grossly nonfocal, tells me her name Psych: does not appear to be responding to internal stimuli, affect blunted Ext: right UE PICC  has been removed, L forearm has IV  Laboratory:  Recent Labs Lab 07/10/13 0545 07/11/13 0341 07/12/13 0515  WBC 5.1 5.4 5.4  HGB 10.3* 10.1* 11.3*  HCT 30.3* 29.9* 32.9*  PLT 128* 124* 148*    Recent Labs Lab 07/12/13 0515 07/13/13 0500 07/14/13 0605  NA 141 145 144  K 3.6 3.2* 3.7  CL 107 111 110  CO2 17* 22 27  BUN 4* 4* 5*  CREATININE 0.37* 0.34* 0.35*  CALCIUM 9.4 8.5 9.3  GLUCOSE 106* 130* 101*   UDS + for benzodiazepines   Micro: as above  CT Head 11/8:  No acute intracranial findings.  Chronic ischemic microvascular disease.   [name] Dodrill, MD 07/15/2013, 9:32 AM PGY-2, [place] FPTS Intern pager: [phone], text pages welcome

## 2013-07-15 NOTE — Progress Notes (Signed)
CSW attempted to arrange d/c of patient today to [facility] which is patient's choice. CSW called patient's son who stated that he lives in [place] and is unable to complete paperwork. He denied that there is any family or responsible person in this area. His mother is not able to complete paperwork per son due to confusion.  CSW arranged with [facility]- Katheran Awe to fax to son the mandatory admission papers that must be signed today and then mail the rest to him for signature.  Patient agreed to this arrangement- would sign papers and re-fax them to facility with planned d/c today to SNF.  Ok received per Dr. Pollie Meyer.  CSW just received call from Bixby at [facility] stating that she received a call from the son who stated that it was "inconvenient" for him to complete this process today and that he would follow up with her in the a.m to receive the faxed paperwork. Thus, the facility cannot accept patient today. CSW attempted to call son back without success.  Notified Dr. Pollie Meyer of above and CSW will work to facilitate d/c tomorrow.  Lorri Frederick. [place]  [phone]

## 2013-07-15 NOTE — Progress Notes (Addendum)
Attending Addendum  I examined the patient and discussed the assessment and plan with Dr. Pollie Meyer. I have reviewed the note and agree.  Noted phenytoin level of 5.8 from 07/14/15. Increased dose to 20 mg TID (07/15/13). Level will need to be repeated on 07/16/13, goal 10-20.   Patient medically stable for discharge to SNF.     Dessa Phi, MD FAMILY MEDICINE TEACHING SERVICE

## 2013-07-16 LAB — COMPREHENSIVE METABOLIC PANEL
ALT: 27 U/L (ref 0–35)
AST: 32 U/L (ref 0–37)
CO2: 26 mEq/L (ref 19–32)
Calcium: 9.2 mg/dL (ref 8.4–10.5)
Chloride: 102 mEq/L (ref 96–112)
GFR calc non Af Amer: >90 mL/min (ref >90)
Sodium: 138 mEq/L (ref 135–145)
Total Bilirubin: 0.1 mg/dL — ABNORMAL LOW (ref 0.3–1.2)
Total Protein: 6.2 g/dL (ref 6.0–8.3)

## 2013-07-16 LAB — GLUCOSE, CAPILLARY

## 2013-07-16 MED ORDER — PHENYTOIN SODIUM EXTENDED 200 MG PO CAPS: 200.0000 mg | ORAL_CAPSULE | Freq: Two times a day (BID) | ORAL | Status: DC

## 2013-07-16 NOTE — Progress Notes (Signed)
ANTICOAGULATION CONSULT NOTE - Follow up consult  Pharmacy Consult for Lovenox Indication: DVT  Allergies  Allergen Reactions  . Depakote [Valproic Acid]   . Haldol [Haloperidol Lactate]   . Penicillins   . Shellfish Allergy   . Trileptal [Oxcarbazepine]     Patient Measurements: Height: 5\' 5"  (165.1 cm) Weight: 158 lb 8 oz (71.895 kg) IBW/kg (Calculated) : 57   Vital Signs: Temp: 98.8 F (37.1 C) (11/17 0500) BP: 125/62 mmHg (11/17 0500) Pulse Rate: 92 (11/17 0500)  Labs:  Recent Labs  07/14/13 0605 07/16/13 0410  CREATININE 0.35* 0.42*    Estimated Creatinine Clearance: 69.7 ml/min (by C-G formula based on Cr of 0.42).   Medical History: Past Medical History  Diagnosis Date  . CHF (congestive heart failure)   . Seizures   . Schizophrenia   . Diabetes mellitus without complication   . Hypertension   . Asthma   . Hepatitis A   . Hepatitis B   . Hepatitis C   . Respiratory failure   . Smoker     1 pack daily    Assessment: 67yof admitted with Sz and found to have RUE DVT on 07/13/13. Anticoagulation treatment dose Lovenox 1mg /kg = 70mg  q12 hr started on 07/13/13. Last CBC stable on 07/12/13 with pltc = 148K.  SCr 0.42, CrCl ~69 ml/min, no bleeding noted. Not a good candidate for PO anticoagutlant agent with multiple seizure meds and history of hepatitis.  MD plans to continue SQ lovenox for 3 months. Patient to be discharged to SNF today.   Goal of Therapy:  Anti-Xa level 0.6-1.2 units/ml 4hrs after LMWH dose given Monitor platelets by anticoagulation protocol: Yes   Plan:  Continue Lovenox 1mg /kg = 70mg  q12 hours Weekly CBC and BMET. Also recommend to recheck dilantin level on Friday 07/20/13.  Noah Delaine, RPh Clinical Pharmacist Pager: [phone] 07/16/2013 1:29 PM

## 2013-07-16 NOTE — Care Management Note (Signed)
    Page 1 of 1   07/16/2013     1:52:53 PM   CARE MANAGEMENT NOTE 07/16/2013  Patient:  [name],[name]   Account Number:  [id number]  Date Initiated:  07/09/2013  Documentation initiated by:  MAYO,HENRIETTA  Subjective/Objective Assessment:   adm with dx of seizures     Action/Plan:   Anticipated DC Date:     Anticipated DC Plan:    In-house referral  Clinical Social Worker      DC Planning Services  CM consult      Choice offered to / List presented to:             Status of service:  Completed, signed off Medicare Important Message given?   (If response is "NO", the following Medicare IM given date fields will be blank) Date Medicare IM given:   Date Additional Medicare IM given:    Discharge Disposition:  SKILLED NURSING FACILITY  Per UR Regulation:  Reviewed for med. necessity/level of care/duration of stay  If discussed at Long Length of Stay Meetings, dates discussed:   07/17/2013    Comments:  Contact:  Chong Sicilian, son  [phone]  07-16-13 [address] [place] [phone] Pt planned for D/c to [facility] SNF today.   07/10/2013 Carlyle Lipa, RN BSN [facility] CCM 1506--Pt with seizures and intubation late on 11/10 and transfer to ICU. There now, on vent with IV gtt sedation.  07/09/13 1511 Henrietta Mayo RN MSN BSN CCM Per son, pt is [name] in [place].  CSW notified.

## 2013-07-16 NOTE — Progress Notes (Signed)
Speech Language Pathology Treatment: Dysphagia  Patient Details Name: [name] MRN: [id number] DOB: 14-Mar-1946 Today's Date: 07/16/2013 Time: 1478-2956 SLP Time Calculation (min): 15 min  Assessment / Plan / Recommendation Clinical Impression  Skilled treatment session focused on addressing dysphagia goals.  SLP set-up crackers and thin liquids via straw.  Patient consumed trials with delayed throat clear x1.  SLP provided Min verbal cues to cough hard, which appeared to clear possible penetrates.  Patient demonstrated prolonged mastication with trace oral residue that cleared with sips. Patient also required cues to refrain from laughing or talking to maximize safety with p.o. Intake.  Recommend to continue with current diet due to patient being edentulous as well as full supervision given cognition.  No further skilled SLP services are warranted at this time. Patient ready for discharge to SNF later today.        HPI HPI: LASHONNA RIEKE is a 67 y.o. female presenting with seizure . PMH is significant for seizure disorder, schizophrenia, CHF, HTN, diabetes, and Hepatitis A,B, and C. CT head negative.   Pt required intubation,extubated 11/12.  today pt seen to assess tolerance of diet - soft/thin.     Pertinent Vitals Low grade temp last night; No pain   SLP Plan  All goals met    Recommendations Diet recommendations: Dysphagia 3 (mechanical soft);Thin liquid Liquids provided via: Cup;Straw Medication Administration: Whole meds with puree Supervision: Patient able to self feed;Full supervision/cueing for compensatory strategies Compensations: Slow rate;Small sips/bites;Check for pocketing Postural Changes and/or Swallow Maneuvers: Seated upright 90 degrees;Upright 30-60 min after meal;Out of bed for meals              Oral Care Recommendations: Oral care BID Follow up Recommendations: 24 hour supervision/assistance Plan: All goals met    GO    Charlane Ferretti.,  CCC-SLP [phone]  Remedy Corporan 07/16/2013, 1:54 PM

## 2013-07-16 NOTE — Clinical Social Work Note (Addendum)
11:37am- CSW contacted Marylene Land at [facility].  Marylene Land stated that she is working with the son to sign paperwork electronically.  Marylene Land has given CSW permission to call for transportation around 2pm.  RN Neysa Bonito is aware.  MD will need to addend the dc summary or document that dc summary needs no changes.  MD paged to make aware.  CSW will continue to assist.  10:19 am- CSW left message with Marylene Land at [facility], SNF to confirm that pt son had faxed the necessary paperwork for pt admission to SNF.  CSW awaiting a return call.  Vickii Penna, LCSWA [phone]  Clinical Social Work

## 2013-07-16 NOTE — Clinical Social Work Note (Signed)
CSW sent dc summary electronically to [facility], SNF.  Vickii Penna, LCSWA [phone]  Clinical Social Work

## 2013-07-17 ENCOUNTER — Non-Acute Institutional Stay (SKILLED_NURSING_FACILITY): Payer: Medicare Other | Admitting: Internal Medicine

## 2013-07-17 DIAGNOSIS — I10 Essential (primary) hypertension: Secondary | ICD-10-CM

## 2013-07-18 LAB — PHENYTOIN LEVEL, FREE AND TOTAL: Phenytoin, Total: 6.1 mg/L — ABNORMAL LOW (ref 10.0–20.0)

## 2013-07-20 ENCOUNTER — Non-Acute Institutional Stay (SKILLED_NURSING_FACILITY): Payer: Medicare Other | Admitting: Internal Medicine

## 2013-07-20 DIAGNOSIS — I10 Essential (primary) hypertension: Secondary | ICD-10-CM

## 2013-07-20 DIAGNOSIS — E1149 Type 2 diabetes mellitus with other diabetic neurological complication: Secondary | ICD-10-CM

## 2013-07-20 DIAGNOSIS — R269 Unspecified abnormalities of gait and mobility: Secondary | ICD-10-CM

## 2013-07-20 DIAGNOSIS — G40901 Epilepsy, unspecified, not intractable, with status epilepticus: Secondary | ICD-10-CM

## 2013-07-20 DIAGNOSIS — G40401 Other generalized epilepsy and epileptic syndromes, not intractable, with status epilepticus: Secondary | ICD-10-CM

## 2013-07-20 NOTE — Progress Notes (Signed)
Patient ID: [name], female   DOB: [birth year], 67 y.o.   MRN: [id number]  Facility; [facility] Chief complaint; admission to SNF post admit to [facility] from November 6 to November 17  History; this is a 67 year old woman who presented on November 6 with seizures and altered mental status. She is a past history of seizure disorder. CT scan of the head was negative. Intermittent seizures continued despite treatment with Keppra, Dilantin, and vimpat. The patient's seizures did not always correspond with EEGs. She was also febrile to a temperature of 102. Blood urine and chest x-ray were ordered and concern for a UTI suggested the need for Levaquin. Limoli culture showed Escherichia coli. Apparently her questionable seizures were severe enough that a neurologist recommended intubation for airway protection and propofol drip. 24-hour EEG monitoring showed status epilepticus. She was also noted to have a right upper extremity DVT in the setting of a PICC line PICC was removed  Past Medical History  Diagnosis Date  . CHF (congestive heart failure)   . Seizures   . Schizophrenia   . Diabetes mellitus without complication   . Hypertension   . Asthma   . Hepatitis A   . Hepatitis B   . Hepatitis C   . Respiratory failure   . Smoker     1 pack daily   Social; I have very little information on this patient  Medications; ASA 81 daily, calcium carbonate 500 twice a day, Klonopin 0.5 twice a day, Lovenox 70 mg and to the skin every 12 hours, Prolixin 5 mg every 6 hours as needed, Prolixin 25 mg per ml 25 mg to 14 day injection exact dose not completely certain, vimpat 200 twice a day, Lamictal 150 each bedtime, Keppra thousand twice a day, magnesium oxide 400 daily, but Toprolol 25 twice a day, NovoLog flex pen sliding scale with meals, Dilantin 100 3 times a day, thiamine 100 daily  Review of systems Gen. patient denies any knowledge of a history of seizures Respiratory no shortness of  breath Cardiac no chest pain  Physical examination General patient is not in any distress, possible mild tardive dyskinesia with truncal rocking Pulse 97 O2 sat 97% on room air respiratory rate 18 HEENT she is a nodule is no oral lesions Respiratory clear entry bilaterally no crackles or wheezes Cardiac heart sounds are normal no murmurs no gallops no carotid bruits Abdomen no liver no spleen no tenderness. GU bladder is not distended there is no costovertebral angle tenderness Neurologic; significant lower extremity weakness at 4/5 bilaterally she is hyporeflexic Gait; she is able to bring herself to a standing position however her balance is very poor. Can take a few steps gait is wide-based she is at extreme risk for falling  Impression/plan #1 status epilepticus now stabilized. Her exact course in the hospital is a bit difficult to determine. She apparently had some events that were interpreted as seizures clinically that did not have the EEG manifestations. In any case she is on a complex and he epilepticus regimen. #2 upper extremity DVT discharged on Lovenox for 3 months. Apparently she was not felt to be a good candidate for by mouth agents secondary to multiple seizure medications and a history of hepatitis. I am not completely clear why she couldn't be transitioned to Coumadin assuming there was contraindications to xarelto. She is not a great candidate for any anticoagulants secondary to fall risks. #3 sepsis secondary to UTI she has completed antibiotics apparently this was  felt to be staph although Escherichia coli is also mentioned. #4 type 2 diabetes and apparently she is only on a loose sliding scale her hemoglobin A1c was 6.7 without any medications #5 hypertension with a history of CHF for. Echocardiogram was recommended as an outpatient. There was no evidence of CHF at the bedside #6 schizophrenia who she is on fluphenazine decanoate 25 mg every week and every 6 when necessary.  He already appears to have mild tardive dyskinesia. Her thought form is very difficult to fall she is not overtly psychotic however wasn't able to get too much rational out of her about her past medical history.  I don't have much social information here. This lady's background living status and functional status is unclear. Lab work will be ordered. I will go over things with the consultants pharmacist however I don't think keeping this lady on Lovenox for 3 months is a great option given the extreme cost and other issues.

## 2013-07-31 ENCOUNTER — Non-Acute Institutional Stay (SKILLED_NURSING_FACILITY): Payer: Medicare Other | Admitting: Internal Medicine

## 2013-07-31 DIAGNOSIS — Z79899 Other long term (current) drug therapy: Secondary | ICD-10-CM

## 2013-07-31 DIAGNOSIS — R569 Unspecified convulsions: Secondary | ICD-10-CM

## 2013-08-01 ENCOUNTER — Non-Acute Institutional Stay (SKILLED_NURSING_FACILITY): Payer: Medicare Other | Admitting: Internal Medicine

## 2013-08-01 DIAGNOSIS — I82629 Acute embolism and thrombosis of deep veins of unspecified upper extremity: Secondary | ICD-10-CM

## 2013-08-01 DIAGNOSIS — I82621 Acute embolism and thrombosis of deep veins of right upper extremity: Secondary | ICD-10-CM

## 2013-08-14 ENCOUNTER — Encounter: Payer: Self-pay | Admitting: Internal Medicine

## 2013-08-14 ENCOUNTER — Non-Acute Institutional Stay (SKILLED_NURSING_FACILITY): Payer: Medicare Other | Admitting: Internal Medicine

## 2013-08-14 ENCOUNTER — Other Ambulatory Visit: Payer: Self-pay | Admitting: *Deleted

## 2013-08-14 DIAGNOSIS — G40909 Epilepsy, unspecified, not intractable, without status epilepticus: Secondary | ICD-10-CM

## 2013-08-14 DIAGNOSIS — I509 Heart failure, unspecified: Secondary | ICD-10-CM

## 2013-08-14 DIAGNOSIS — I4891 Unspecified atrial fibrillation: Secondary | ICD-10-CM

## 2013-08-14 DIAGNOSIS — E876 Hypokalemia: Secondary | ICD-10-CM

## 2013-08-14 MED ORDER — CLONAZEPAM 0.5 MG PO TABS: ORAL_TABLET | ORAL | Status: DC

## 2013-08-14 NOTE — Progress Notes (Signed)
       PROGRESS NOTE  DATE: 08/14/2013  FACILITY: Nursing Home Location: [facility]  LEVEL OF CARE: SNF (31)  Routine Visit  CHIEF COMPLAINT:  Manage CHF, seizure disorder and atrial fibrillation  HISTORY OF PRESENT ILLNESS:  REASSESSMENT OF ONGOING PROBLEM(S):  ATRIAL FIBRILLATION: the patients atrial fibrillation remains stable.  The patient denies DOE, tachycardia, orthopnea, transient neurological sx, pedal edema, palpitations, & PNDs.  No complications noted from the medications currently being used.  CHF:The patient does not relate significant weight changes, denies sob, DOE, orthopnea, PNDs, pedal edema, palpitations or chest pain.  CHF remains stable.  No complications form the medications being used.  SEIZURE DISORDER: The patient's seizure disorder remains stable. No complications reported from the medications presently being used. Staff do not report any recent seizure activity.  PAST MEDICAL HISTORY : Reviewed.  No changes.  CURRENT MEDICATIONS: Reviewed per [facility]  REVIEW OF SYSTEMS:  GENERAL: no change in appetite, no fatigue, no weight changes, no fever, chills or weakness RESPIRATORY: no cough, SOB, DOE, wheezing, hemoptysis CARDIAC: no chest pain, edema or palpitations GI: no abdominal pain, diarrhea, constipation, heart burn, nausea or vomiting  PHYSICAL EXAMINATION  VS:  T 97.2       P 86     RR 18      BP 142/82     POX %     WT (Lb) 150  GENERAL: no acute distress, normal body habitus EYES: conjunctivae normal, sclerae normal, normal eye lids NECK: supple, trachea midline, no neck masses, no thyroid tenderness, no thyromegaly LYMPHATICS: no LAN in the neck, no supraclavicular LAN RESPIRATORY: breathing is even & unlabored, BS CTAB CARDIAC: Irregularly irregular, no murmur,no extra heart sounds, no edema GI: abdomen soft, normal BS, no masses, no tenderness, no hepatomegaly, no splenomegaly PSYCHIATRIC: the patient is alert &  oriented to person, affect & behavior appropriate  LABS/RADIOLOGY:  11-14 glucose 136, calcium 10.3 otherwise CMP normal, Dilantin level 3.6  ASSESSMENT/PLAN:  Atrial fibrillation-rate controlled Seizure disorder-well-controlled CHF-compensated Hypokalemia-were repleted Diabetes mellitus-check hemoglobin A1c Hypertension-blood pressure borderline Peripheral neuropathy-continue Neurontin Schizophrenia-stable Check CBC and Depakote level  CPT CODE: 47829

## 2013-08-15 ENCOUNTER — Other Ambulatory Visit: Payer: Self-pay | Admitting: *Deleted

## 2013-08-15 MED ORDER — LACOSAMIDE 200 MG PO TABS: ORAL_TABLET | ORAL | Status: AC

## 2013-08-16 NOTE — Progress Notes (Addendum)
Patient ID: [name], female   DOB: 06-May-1946, 67 y.o.   MRN: [id number]           PROGRESS NOTE  DATE:  08/01/2013    FACILITY: [facility]    LEVEL OF CARE:   SNF   Acute Visit   HISTORY OF PRESENT ILLNESS:  This is a 67 year-old woman who came to [place] after an admission to [facility] from July 05, 2013 through July 16, 2013,  apparently with status epilepticus.   She also developed a DVT in the setting of a PICC line in her right upper extremity.  She comes to [place] on Lovenox.    There were  apparently drug interactions between Xarelto and some of her antiepileptic medications.  I have clarified this issue with the consultant pharmacist in the facility.  Nevertheless, there is no absolute contraindication to putting her on Coumadin and I would try to adjust this to maintain a just therapeutic dose with an INR of 2.  There are interactions with Coumadin, as well, although we should be able to monitor the Coumadin.  She has remained on Lovenox at 70 q.12.  Drug interactions involving Xarelto or Eliquis negate their use.    It is also noteworthy that she has been refusing the Lovenox injections at times.     PHYSICAL EXAMINATION:  GENERAL APPEARANCE:  The patient does not appear to be in any distress.  She is loud with notable schizophrenia, although she does not appear to be overtly psychotic at the moment.   CHEST/RESPIRATORY:  Clear air entry bilaterally.   CARDIOVASCULAR:  CARDIAC:   Heart sounds are normal.  There are no murmurs.    ASSESSMENT/PLAN:  Status epilepticus.  Now stable on a very aggressive antiepileptic regimen including Keppra, Lamictal, Vimpat, and Dilantin.  I do not actually see a Dilantin level on the chart.    DVT in the right upper extremity.  Note the recommendations too avoid Xarelto or Eliquis.   I would like to begin her on Coumadin and I will write these orders and have Pharmacy to follow this.  Again, this may be for the next 2-1/2 months or so,  and I would like to maintain a low therapeutic INR.

## 2013-08-22 ENCOUNTER — Emergency Department (HOSPITAL_COMMUNITY)
Admission: EM | Admit: 2013-08-22 | Discharge: 2013-08-22 | Disposition: A | Discharge status: Skilled Nursing Facility | Payer: Medicare Other | Attending: Emergency Medicine | Admitting: Emergency Medicine

## 2013-08-22 ENCOUNTER — Encounter (HOSPITAL_COMMUNITY): Payer: Self-pay | Admitting: Emergency Medicine

## 2013-08-22 ENCOUNTER — Emergency Department (HOSPITAL_COMMUNITY): Payer: Medicare Other

## 2013-08-22 DIAGNOSIS — Z7982 Long term (current) use of aspirin: Secondary | ICD-10-CM | POA: Insufficient documentation

## 2013-08-22 DIAGNOSIS — Z79899 Other long term (current) drug therapy: Secondary | ICD-10-CM | POA: Insufficient documentation

## 2013-08-22 DIAGNOSIS — F209 Schizophrenia, unspecified: Secondary | ICD-10-CM | POA: Insufficient documentation

## 2013-08-22 DIAGNOSIS — Z8709 Personal history of other diseases of the respiratory system: Secondary | ICD-10-CM | POA: Insufficient documentation

## 2013-08-22 DIAGNOSIS — S0181XA Laceration without foreign body of other part of head, initial encounter: Secondary | ICD-10-CM

## 2013-08-22 DIAGNOSIS — F17200 Nicotine dependence, unspecified, uncomplicated: Secondary | ICD-10-CM | POA: Insufficient documentation

## 2013-08-22 DIAGNOSIS — W19XXXA Unspecified fall, initial encounter: Secondary | ICD-10-CM | POA: Insufficient documentation

## 2013-08-22 DIAGNOSIS — S0180XA Unspecified open wound of other part of head, initial encounter: Secondary | ICD-10-CM | POA: Insufficient documentation

## 2013-08-22 DIAGNOSIS — J45909 Unspecified asthma, uncomplicated: Secondary | ICD-10-CM | POA: Insufficient documentation

## 2013-08-22 DIAGNOSIS — Z23 Encounter for immunization: Secondary | ICD-10-CM | POA: Insufficient documentation

## 2013-08-22 DIAGNOSIS — I509 Heart failure, unspecified: Secondary | ICD-10-CM | POA: Insufficient documentation

## 2013-08-22 DIAGNOSIS — Y9210 Unspecified residential institution as the place of occurrence of the external cause: Secondary | ICD-10-CM | POA: Insufficient documentation

## 2013-08-22 DIAGNOSIS — Z7901 Long term (current) use of anticoagulants: Secondary | ICD-10-CM | POA: Insufficient documentation

## 2013-08-22 DIAGNOSIS — Z794 Long term (current) use of insulin: Secondary | ICD-10-CM | POA: Insufficient documentation

## 2013-08-22 DIAGNOSIS — Z8619 Personal history of other infectious and parasitic diseases: Secondary | ICD-10-CM | POA: Insufficient documentation

## 2013-08-22 DIAGNOSIS — Z880 Allergy status to penicillin: Secondary | ICD-10-CM | POA: Insufficient documentation

## 2013-08-22 DIAGNOSIS — G40909 Epilepsy, unspecified, not intractable, without status epilepticus: Secondary | ICD-10-CM | POA: Insufficient documentation

## 2013-08-22 DIAGNOSIS — Y939 Activity, unspecified: Secondary | ICD-10-CM | POA: Insufficient documentation

## 2013-08-22 DIAGNOSIS — I10 Essential (primary) hypertension: Secondary | ICD-10-CM | POA: Insufficient documentation

## 2013-08-22 DIAGNOSIS — E119 Type 2 diabetes mellitus without complications: Secondary | ICD-10-CM | POA: Insufficient documentation

## 2013-08-22 LAB — GLUCOSE, CAPILLARY: Glucose-Capillary: 122 mg/dL — ABNORMAL HIGH (ref 70–99)

## 2013-08-22 MED ORDER — DOXYCYCLINE HYCLATE 100 MG PO CAPS: 100.0000 mg | ORAL_CAPSULE | Freq: Two times a day (BID) | ORAL | Status: DC

## 2013-08-22 MED ORDER — LORAZEPAM 2 MG/ML IJ SOLN
0.5000 mg | Freq: Once | INTRAMUSCULAR | Status: AC
  Administered 2013-08-22: 0.5 mg via INTRAMUSCULAR
  Filled 2013-08-22: qty 1

## 2013-08-22 MED ORDER — TETANUS-DIPHTH-ACELL PERTUSSIS 5-2.5-18.5 LF-MCG/0.5 IM SUSP
0.5000 mL | Freq: Once | INTRAMUSCULAR | Status: AC
  Administered 2013-08-22: 0.5 mL via INTRAMUSCULAR
  Filled 2013-08-22: qty 0.5

## 2013-08-22 MED ORDER — LORAZEPAM 2 MG/ML IJ SOLN
0.5000 mg | Freq: Once | INTRAMUSCULAR | Status: AC
  Administered 2013-08-22: 0.5 mg via INTRAMUSCULAR

## 2013-08-22 NOTE — ED Notes (Signed)
PA at bedside to use dermabond and steristrips to close wound due to patient refusing to have stitches.

## 2013-08-22 NOTE — ED Provider Notes (Signed)
LACERATION REPAIR Performed by: Johnnette Gourd Authorized by: Johnnette Gourd Consent: Verbal consent obtained. Risks and benefits: risks, benefits and alternatives were discussed Consent given by: patient Patient identity confirmed: provided demographic data Prepped and Draped in normal sterile fashion Wound explored  Laceration Location: below right eye  Laceration Length: 2 cm  No Foreign Bodies seen or palpated  Anesthesia: none  Irrigation method: syringe Amount of cleaning: standard  Skin closure: dermabond, steri-strip  Number of sutures: 1 steri strip   Patient tolerance: Patient tolerated the procedure well with no immediate complications.   Trevor Mace, PA-C 08/22/13 [phone]

## 2013-08-22 NOTE — Discharge Instructions (Signed)
Facial Laceration  A facial laceration is a cut on the face. Lacerations usually heal quickly, but they need special care to reduce scarring. It will take 1 to 2 years for the scar to lose its redness and to heal completely.  TREATMENT   Some facial lacerations may not require closure. Some lacerations may not be able to be closed due to an increased risk of infection. It is important to see your caregiver as soon as possible after an injury to minimize the risk of infection and to maximize the opportunity for successful closure.  If closure is appropriate, pain medicines may be given, if needed. The wound will be cleaned to help prevent infection. Your caregiver will use stitches (sutures), staples, wound glue (adhesive), or skin adhesive strips to repair the laceration. These tools bring the skin edges together to allow for faster healing and a better cosmetic outcome. However, all wounds will heal with a scar.   Once the wound has healed, scarring can be minimized by covering the wound with sunscreen during the day for 1 full year. Use a sunscreen with an SPF of at least 30. Sunscreen helps to reduce the pigment that will form in the scar. When applying sunscreen to a completely healed wound, massage the scar for a few minutes to help reduce the appearance of the scar. Use circular motions with your fingertips, on and around the scar. Do not massage a healing wound.  HOME CARE INSTRUCTIONS  For sutures:   Keep the wound clean and dry.   If you were given a bandage (dressing), you should change it at least once a day. Also change the dressing if it becomes wet or dirty, or as directed by your caregiver.   Wash the wound with soap and water 2 times a day. Rinse the wound off with water to remove all soap. Pat the wound dry with a clean towel.   After cleaning, apply a thin layer of the antibiotic ointment recommended by your caregiver. This will help prevent infection and keep the dressing from sticking.   You  may shower as usual after the first 24 hours. Do not soak the wound in water until the sutures are removed.   Only take over-the-counter or prescription medicines for pain, discomfort, or fever as directed by your caregiver.   Get your sutures removed as directed by your caregiver. With facial lacerations, sutures should usually be taken out after 4 to 5 days to avoid stitch marks.   Wait a few days after your sutures are removed before applying makeup.  For skin adhesive strips:   Keep the wound clean and dry.   Do not get the skin adhesive strips wet. You may bathe carefully, using caution to keep the wound dry.   If the wound gets wet, pat it dry with a clean towel.   Skin adhesive strips will fall off on their own. You may trim the strips as the wound heals. Do not remove skin adhesive strips that are still stuck to the wound. They will fall off in time.  For wound adhesive:   You may briefly wet your wound in the shower or bath. Do not soak or scrub the wound. Do not swim. Avoid periods of heavy perspiration until the skin adhesive has fallen off on its own. After showering or bathing, gently pat the wound dry with a clean towel.   Do not apply liquid medicine, cream medicine, ointment medicine, or makeup to your wound while the   wound, be careful not to apply tape directly over the skin adhesive. This may cause the adhesive to be pulled off before the wound is healed. °· Avoid prolonged exposure to sunlight or tanning lamps while the skin adhesive is in place. Exposure to ultraviolet light in the first year will darken the scar. °· The skin adhesive will usually remain in place for 5 to 10 days, then naturally fall off the skin. Do not pick at the adhesive film. °You may need a tetanus shot if: °· You cannot remember when you had your last tetanus shot. °· You have never had a tetanus  shot. °If you get a tetanus shot, your arm may swell, get red, and feel warm to the touch. This is common and not a problem. If you need a tetanus shot and you choose not to have one, there is a rare chance of getting tetanus. Sickness from tetanus can be serious. °SEEK IMMEDIATE MEDICAL CARE IF: °· You develop redness, pain, or swelling around the wound. °· There is yellowish-white fluid (pus) coming from the wound. °· You develop chills or a fever. °MAKE SURE YOU: °· Understand these instructions. °· Will watch your condition. °· Will get help right away if you are not doing well or get worse. °Document Released: 09/23/2004 Document Revised: 11/08/2011 Document Reviewed: 03/29/2013 °[employer]® Patient Information ©2014 [employer]. ° °

## 2013-08-22 NOTE — ED Provider Notes (Signed)
Medical screening examination/treatment/procedure(s) were performed by non-physician practitioner and as supervising physician I was immediately available for consultation/collaboration.  EKG Interpretation   None        Cartha Rotert K Asta Corbridge-Rasch, MD 08/22/13 [phone]

## 2013-08-22 NOTE — ED Notes (Signed)
Came from nsg home, staff found patient outside, no witness of fall, noted lac and swelling around right eye, appears to have fall forward pt is found to be hypothermic outside environment. Appears to have alter mental status, patient c/o of back pain, left knee, pt did not appear to lose consciouness, 12 ekg done in ambulance, 151/89 hr 84

## 2013-08-22 NOTE — ED Notes (Signed)
Came back from xray and head ct

## 2013-08-22 NOTE — ED Provider Notes (Addendum)
CSN: [id number]     Arrival date & time 08/22/13  0138 History   First MD Initiated Contact with Patient 08/22/13 0155     Chief Complaint  Patient presents with  . Fall   (Consider location/radiation/quality/duration/timing/severity/associated sxs/prior Treatment) Patient is a 67 y.o. female presenting with fall. The history is provided by the EMS personnel. The history is limited by the condition of the patient.  Fall This is a new problem. The current episode started less than 1 hour ago. The problem occurs constantly. The problem has not changed since onset.Nothing aggravates the symptoms. Nothing relieves the symptoms. She has tried nothing for the symptoms. The treatment provided no relief.    Past Medical History  Diagnosis Date  . CHF (congestive heart failure)   . Seizures   . Schizophrenia   . Diabetes mellitus without complication   . Hypertension   . Asthma   . Hepatitis A   . Hepatitis B   . Hepatitis C   . Respiratory failure   . Smoker     1 pack daily   History reviewed. No pertinent past surgical history. History reviewed. No pertinent family history. History  Substance Use Topics  . Smoking status: Current Every Day Smoker -- 1.00 packs/day for 50 years    Types: Cigarettes  . Smokeless tobacco: Not on file  . Alcohol Use: Not on file   OB History   Grav Para Term Preterm Abortions TAB SAB Ect Mult Living                 Review of Systems  Unable to perform ROS   Allergies  Depakote; Haldol; Penicillins; Shellfish allergy; and Trileptal  Home Medications   Current Outpatient Rx  Name  Route  Sig  Dispense  Refill  . acetaminophen (TYLENOL) 500 MG tablet   Oral   Take 500 mg by mouth every 6 (six) hours as needed for pain.         [name] aspirin EC 81 MG tablet   Oral   Take 81 mg by mouth daily.         . B Complex-C (B-COMPLEX WITH VITAMIN C) tablet   Oral   Take 1 tablet by mouth daily.         . calcium carbonate (TUMS - DOSED IN  MG ELEMENTAL CALCIUM) 500 MG chewable tablet   Oral   Chew 1 tablet by mouth 2 (two) times daily.         . clonazePAM (KLONOPIN) 0.5 MG tablet      Take one tablet by mouth twice daily as needed for anxiety   60 tablet   5   . docusate sodium (COLACE) 100 MG capsule   Oral   Take 100 mg by mouth 2 (two) times daily.         [name] enoxaparin (LOVENOX) 80 MG/0.8ML injection   Subcutaneous   Inject 0.7 mLs (70 mg total) into the skin every 12 (twelve) hours.   0 Syringe      . fluPHENAZine (PROLIXIN) 5 MG tablet   Oral   Take 5 mg by mouth every 6 (six) hours as needed (for breakthrough).         . fluPHENAZine decanoate (PROLIXIN) 25 MG/ML injection   Intramuscular   Inject into the muscle every 14 (fourteen) days.         . insulin aspart (NOVOLOG FLEXPEN) 100 UNIT/ML SOPN FlexPen   Subcutaneous   Inject 0-8 Units into  the skin 3 (three) times daily with meals. Per blood sugar level sliding scale         . lacosamide (VIMPAT) 200 MG TABS tablet      Take one tablet by mouth twice daily   60 tablet   5   . lamoTRIgine (LAMICTAL) 150 MG tablet   Oral   Take 150 mg by mouth at bedtime.         . levETIRAcetam (KEPPRA) 1000 MG tablet   Oral   Take 1 tablet (1,000 mg total) by mouth 2 (two) times daily.         . magnesium oxide (MAG-OX) 400 MG tablet   Oral   Take 400 mg by mouth daily.         . metoprolol (LOPRESSOR) 50 MG tablet   Oral   Take 25 mg by mouth 2 (two) times daily.         . phenytoin (DILANTIN) 100 MG ER capsule   Oral   Take 100 mg by mouth 3 (three) times daily.         [name] thiamine (VITAMIN B-1) 100 MG tablet   Oral   Take 100 mg by mouth daily.          BP 149/65  Pulse 101  Temp(Src) 97.4 F (36.3 C) (Oral)  Resp 19  SpO2 98% Physical Exam  Constitutional: She appears well-developed and well-nourished.  HENT:  Head: Macrocephalic. Head is without raccoon's eyes and without Battle's sign.    Right Ear: No  hemotympanum.  Mouth/Throat: Oropharynx is clear and moist.  Eyes: Conjunctivae are normal. Pupils are equal, round, and reactive to light.  Neck: Normal range of motion. Neck supple.  Cardiovascular: Normal rate, regular rhythm and intact distal pulses.   Pulmonary/Chest: Effort normal and breath sounds normal. She has no wheezes. She has no rales.  Abdominal: Soft. Bowel sounds are normal. There is no tenderness. There is no rebound and no guarding.  Musculoskeletal: Normal range of motion. She exhibits no edema and no tenderness.  Neurological: She is alert. She has normal reflexes.  Skin: Skin is warm and dry.  Psychiatric: She has a normal mood and affect.    ED Course  Procedures (including critical care time) Labs Review Labs Reviewed  GLUCOSE, CAPILLARY - Abnormal; Notable for the following:    Glucose-Capillary 122 (*)    All other components within normal limits   Imaging Review Dg Pelvis 1-2 Views  08/22/2013   CLINICAL DATA:  Status post fall; left hip pain.  EXAM: PELVIS - 1-2 VIEW  COMPARISON:  Abdominal radiograph performed 07/09/2013  FINDINGS: There is no evidence of fracture or dislocation. Both femoral heads are seated normally within their respective acetabula. Severe degenerative change is noted at the left hip, with mild protrusio acetabuli. Diffuse associated sclerotic change is seen. There is mild axial joint space narrowing at the right hip. Mild degenerative change is noted along the lumbar spine. The sacroiliac joints are unremarkable in appearance.  The visualized bowel gas pattern is grossly unremarkable in appearance.  IMPRESSION: 1. No evidence of fracture or dislocation. 2. 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 3. Mild axial joint space narrowing at the right hip.   Electronically Signed   By: Roanna Raider M.D.   On: 08/22/2013 03:21   Ct Head Wo Contrast  08/22/2013   CLINICAL DATA:  Laceration  and swelling around right eye  EXAM: CT  HEAD WITHOUT CONTRAST  CT MAXILLOFACIAL WITHOUT CONTRAST  CT CERVICAL SPINE WITHOUT CONTRAST  TECHNIQUE: Multidetector CT imaging of the head, cervical spine, and maxillofacial structures were performed using the standard protocol without intravenous contrast. Multiplanar CT image reconstructions of the cervical spine and maxillofacial structures were also generated.  COMPARISON:  Prior study from 07/07/2013.  FINDINGS: CT HEAD FINDINGS  Right periorbital soft tissue swelling with a few scattered foci of emphysema is present. Right globe is intact. Calvarium is intact.  There is no acute intracranial hemorrhage or infarct. No mass lesion or midline shift. Gray-white matter differentiation is well maintained. Ventricles are normal in size without evidence of hydrocephalus. CSF containing spaces are within normal limits. No extra-axial fluid collection.  No mastoid effusion.  CT MAXILLOFACIAL FINDINGS  Right periorbital soft tissue contusion involving the preseptal soft tissues is present. The right globe is intact. No retro-orbital hematoma. The intraconal and extraconal fat are clear. The bony right orbit is intact without evidence of a orbital floor fracture or or other acute injury. The right zygomatic arch and right maxillary sinus are intact.  Mandible is intact. Left-sided facial structures including the left globe and bony left orbit are intact.  Paranasal sinuses are clear.  No nasal bone fracture.  No radiopaque foreign body.  The patient is edentulous.  CT CERVICAL SPINE FINDINGS  The vertebral bodies are normally aligned with preservation of the normal cervical lordosis. Vertebral body heights are preserved. Normal C1-2 articulations are intact. No prevertebral soft tissue swelling. No acute fracture or listhesis.  Visualized soft tissues of the neck are within normal limits. Calcifications noted within the carotid bifurcations bilaterally. No apical pneumothorax.   IMPRESSION: CT BRAIN:  1. No acute intracranial process. CT MAXILLOFACIAL:  1. Right periorbital soft tissue contusion. Intact right globe. No evidence of orbital floor fracture or retro-orbital pathology. 2. No acute maxillofacial fracture identified.  CT CERVICAL SPINE:  1. No acute fracture or traumatic malalignment within the cervical spine.   Electronically Signed   By: Rise Mu M.D.   On: 08/22/2013 03:07   Ct Cervical Spine Wo Contrast  08/22/2013   CLINICAL DATA:  Laceration and swelling around right eye  EXAM: CT HEAD WITHOUT CONTRAST  CT MAXILLOFACIAL WITHOUT CONTRAST  CT CERVICAL SPINE WITHOUT CONTRAST  TECHNIQUE: Multidetector CT imaging of the head, cervical spine, and maxillofacial structures were performed using the standard protocol without intravenous contrast. Multiplanar CT image reconstructions of the cervical spine and maxillofacial structures were also generated.  COMPARISON:  Prior study from 07/07/2013.  FINDINGS: CT HEAD FINDINGS  Right periorbital soft tissue swelling with a few scattered foci of emphysema is present. Right globe is intact. Calvarium is intact.  There is no acute intracranial hemorrhage or infarct. No mass lesion or midline shift. Gray-white matter differentiation is well maintained. Ventricles are normal in size without evidence of hydrocephalus. CSF containing spaces are within normal limits. No extra-axial fluid collection.  No mastoid effusion.  CT MAXILLOFACIAL FINDINGS  Right periorbital soft tissue contusion involving the preseptal soft tissues is present. The right globe is intact. No retro-orbital hematoma. The intraconal and extraconal fat are clear. The bony right orbit is intact without evidence of a orbital floor fracture or or other acute injury. The right zygomatic arch and right maxillary sinus are intact.  Mandible is intact. Left-sided facial structures including the left globe and bony left orbit are intact.  Paranasal sinuses are clear.   No nasal bone fracture.  No radiopaque foreign  body.  The patient is edentulous.  CT CERVICAL SPINE FINDINGS  The vertebral bodies are normally aligned with preservation of the normal cervical lordosis. Vertebral body heights are preserved. Normal C1-2 articulations are intact. No prevertebral soft tissue swelling. No acute fracture or listhesis.  Visualized soft tissues of the neck are within normal limits. Calcifications noted within the carotid bifurcations bilaterally. No apical pneumothorax.  IMPRESSION: CT BRAIN:  1. No acute intracranial process. CT MAXILLOFACIAL:  1. Right periorbital soft tissue contusion. Intact right globe. No evidence of orbital floor fracture or retro-orbital pathology. 2. No acute maxillofacial fracture identified.  CT CERVICAL SPINE:  1. No acute fracture or traumatic malalignment within the cervical spine.   Electronically Signed   By: Rise Mu M.D.   On: 08/22/2013 03:07   Ct Maxillofacial Wo Cm  08/22/2013   CLINICAL DATA:  Laceration and swelling around right eye  EXAM: CT HEAD WITHOUT CONTRAST  CT MAXILLOFACIAL WITHOUT CONTRAST  CT CERVICAL SPINE WITHOUT CONTRAST  TECHNIQUE: Multidetector CT imaging of the head, cervical spine, and maxillofacial structures were performed using the standard protocol without intravenous contrast. Multiplanar CT image reconstructions of the cervical spine and maxillofacial structures were also generated.  COMPARISON:  Prior study from 07/07/2013.  FINDINGS: CT HEAD FINDINGS  Right periorbital soft tissue swelling with a few scattered foci of emphysema is present. Right globe is intact. Calvarium is intact.  There is no acute intracranial hemorrhage or infarct. No mass lesion or midline shift. Gray-white matter differentiation is well maintained. Ventricles are normal in size without evidence of hydrocephalus. CSF containing spaces are within normal limits. No extra-axial fluid collection.  No mastoid effusion.  CT MAXILLOFACIAL  FINDINGS  Right periorbital soft tissue contusion involving the preseptal soft tissues is present. The right globe is intact. No retro-orbital hematoma. The intraconal and extraconal fat are clear. The bony right orbit is intact without evidence of a orbital floor fracture or or other acute injury. The right zygomatic arch and right maxillary sinus are intact.  Mandible is intact. Left-sided facial structures including the left globe and bony left orbit are intact.  Paranasal sinuses are clear.  No nasal bone fracture.  No radiopaque foreign body.  The patient is edentulous.  CT CERVICAL SPINE FINDINGS  The vertebral bodies are normally aligned with preservation of the normal cervical lordosis. Vertebral body heights are preserved. Normal C1-2 articulations are intact. No prevertebral soft tissue swelling. No acute fracture or listhesis.  Visualized soft tissues of the neck are within normal limits. Calcifications noted within the carotid bifurcations bilaterally. No apical pneumothorax.  IMPRESSION: CT BRAIN:  1. No acute intracranial process. CT MAXILLOFACIAL:  1. Right periorbital soft tissue contusion. Intact right globe. No evidence of orbital floor fracture or retro-orbital pathology. 2. No acute maxillofacial fracture identified.  CT CERVICAL SPINE:  1. No acute fracture or traumatic malalignment within the cervical spine.   Electronically Signed   By: Rise Mu M.D.   On: 08/22/2013 03:07    EKG Interpretation   None       MDM  No diagnosis found. Will send home on antibiotics    Clemma Johnsen K Howell Groesbeck-Rasch, MD 08/22/13 3086  Jasmine Awe, MD 08/22/13 [phone]

## 2013-08-22 NOTE — ED Notes (Signed)
MD at bedside. 

## 2013-08-22 NOTE — ED Notes (Signed)
Patient transported to CT 

## 2013-08-22 NOTE — ED Notes (Signed)
Patient transported back from head ct

## 2013-08-22 NOTE — ED Notes (Signed)
Walked into patient room and patient had taken all of her clothes and monitor cords off. Will not let this nurse reattach to monitor. Pt. Refusing to let PA/MD stitch up eye. States "You can't do anything to me that I don't want you to do to me". Pt. Consented to ativan.

## 2013-08-22 NOTE — ED Notes (Signed)
Cleaned an irrigated wound. Lac right buccal under right eye.

## 2013-08-22 NOTE — ED Notes (Signed)
PTAR at bedside. [facility] called and given report to pt nurse.

## 2013-08-28 ENCOUNTER — Encounter: Payer: Self-pay | Admitting: Internal Medicine

## 2013-08-28 NOTE — Progress Notes (Signed)
Patient ID: [name], female   DOB: 1946-03-29, 67 y.o.   MRN: [id number]        PROGRESS NOTE  DATE: 07/17/2013    FACILITY:  [facility]  LEVEL OF CARE: SNF (31)  Acute Visit  CHIEF COMPLAINT:  Manage hypertension.    HISTORY OF PRESENT ILLNESS: I was requested by the staff to assess the patient regarding above problem(s):  HTN: Pt 's HTN is unstable.  Denies CP, sob, DOE, pedal edema, headaches, dizziness or visual disturbances.  No complications from the medications currently being used.  Last BP :   Blood pressures today are 180/110 and 150/90.    PAST MEDICAL HISTORY : Reviewed.  No changes.  CURRENT MEDICATIONS: Reviewed per [facility]  REVIEW OF SYSTEMS:  GENERAL: no change in appetite, no fatigue, no weight changes, no fever, chills or weakness RESPIRATORY: no cough, SOB, DOE,, wheezing, hemoptysis CARDIAC: no chest pain, edema or palpitations GI: no abdominal pain, diarrhea, constipation, heart burn, nausea or vomiting  PHYSICAL EXAMINATION  VS:  T 98.5       P 82      RR 16      BP 180/110     POX %       WT (Lb)  GENERAL: no acute distress, normal body habitus NECK: supple, trachea midline, no neck masses, no thyroid tenderness, no thyromegaly RESPIRATORY: breathing is even & unlabored, BS CTAB CARDIAC: RRR, no murmur,no extra heart sounds, no edema GI: abdomen soft, normal BS, no masses, no tenderness, no hepatomegaly, no splenomegaly PSYCHIATRIC: the patient is alert & oriented to person, affect & behavior appropriate  ASSESSMENT/PLAN:  Hypertension.  Uncontrolled.  Increase Lopressor to 50 mg b.i.d.    THN Metrics:   Aspirin 81 mg q.d.  Current smoker.    CPT CODE: 40981

## 2013-09-10 ENCOUNTER — Encounter: Payer: Self-pay | Admitting: Family

## 2013-09-10 ENCOUNTER — Non-Acute Institutional Stay (SKILLED_NURSING_FACILITY): Payer: Medicare Other | Admitting: Family

## 2013-09-10 DIAGNOSIS — E1149 Type 2 diabetes mellitus with other diabetic neurological complication: Secondary | ICD-10-CM

## 2013-09-10 DIAGNOSIS — I82629 Acute embolism and thrombosis of deep veins of unspecified upper extremity: Secondary | ICD-10-CM

## 2013-09-10 DIAGNOSIS — F209 Schizophrenia, unspecified: Secondary | ICD-10-CM

## 2013-09-10 DIAGNOSIS — I10 Essential (primary) hypertension: Secondary | ICD-10-CM

## 2013-09-10 DIAGNOSIS — I82621 Acute embolism and thrombosis of deep veins of right upper extremity: Secondary | ICD-10-CM

## 2013-09-10 DIAGNOSIS — G40909 Epilepsy, unspecified, not intractable, without status epilepticus: Secondary | ICD-10-CM

## 2013-09-10 NOTE — Progress Notes (Signed)
Patient ID: [name], female   DOB: 10-Dec-1945, 68 y.o.   MRN: [id number]  Date: 09/10/13 [facility]     Chief Complaint  Patient presents with  . Discharge Note    HPI:  Pt is being d/[facility] to [facility]. Pt was admitted for short term rehab s/p hospitalization for seizure disorder, urosepsis, RUE DVT with co-morbidities: DM type II, Anemia, Schizophrenia, HTN, CHF, and Hypokalemia.  Health care team denies concerns at present.        Allergies  Allergen Reactions  . Depakote [Valproic Acid] Other (See Comments)    unknown  . Haldol [Haloperidol Lactate] Other (See Comments)    Unknown   . Penicillins Other (See Comments)    Unknown   . Shellfish Allergy Other (See Comments)    unknown  . Trileptal [Oxcarbazepine] Other (See Comments)    unknown     Medication List       This list is accurate as of: 09/10/13  2:16 PM.  Always use your most recent med list.               acetaminophen 500 MG tablet  Commonly known as:  TYLENOL  Take 500 mg by mouth every 6 (six) hours as needed for pain.     aspirin EC 81 MG tablet  Take 81 mg by mouth daily.     B-complex with vitamin C tablet  Take 1 tablet by mouth daily.     calcium carbonate 500 MG chewable tablet  Commonly known as:  TUMS - dosed in mg elemental calcium  Chew 1 tablet by mouth 2 (two) times daily.     clonazePAM 0.5 MG tablet  Commonly known as:  KLONOPIN  Take one tablet by mouth twice daily as needed for anxiety     docusate sodium 100 MG capsule  Commonly known as:  COLACE  Take 100 mg by mouth 2 (two) times daily.     enoxaparin 80 MG/0.8ML injection  Commonly known as:  LOVENOX  Inject 1 mg/kg into the skin every 12 (twelve) hours. D/c Feb 16,2015; for RUE DVT-therapy for total 3 months pt started November 2014     fluPHENAZine 5 MG tablet  Commonly known as:  PROLIXIN  Take 5 mg by mouth every 6 (six) hours as needed (for breakthrough).     fluPHENAZine  decanoate 25 MG/ML injection  Commonly known as:  PROLIXIN  Inject into the muscle every 14 (fourteen) days.     lacosamide 200 MG Tabs tablet  Commonly known as:  VIMPAT  Take one tablet by mouth twice daily     lamoTRIgine 150 MG tablet  Commonly known as:  LAMICTAL  Take 200 mg by mouth 2 (two) times daily.     levETIRAcetam 1000 MG tablet  Commonly known as:  KEPPRA  Take 1 tablet (1,000 mg total) by mouth 2 (two) times daily.     magnesium oxide 400 MG tablet  Commonly known as:  MAG-OX  Take 400 mg by mouth daily.     metoprolol 50 MG tablet  Commonly known as:  LOPRESSOR  Take 25 mg by mouth 2 (two) times daily.     NOVOLOG FLEXPEN 100 UNIT/ML FlexPen  Generic drug:  insulin aspart  Inject 0-8 Units into the skin 3 (three) times daily with meals. Per blood sugar level sliding scale     phenytoin 100 MG ER capsule  Commonly known as:  DILANTIN  Take 100 mg by mouth 3 (three) times daily.     thiamine 100 MG tablet  Commonly known as:  VITAMIN B-1  Take 100 mg by mouth daily.     warfarin 10 MG tablet  Commonly known as:  COUMADIN  Take 10 mg by mouth daily.         DATA REVIEWED   Laboratory Studies: 08/27/13-Phenytoin 23.7, Albumin 3.9 08/20/13-Hemoglobin 7.8 08/16/13-WBC 4.2, Hemoglobin 11.5, Hematocrit 34.9, Platelets 264, Hemoglobin 6.7     Past Medical History  Diagnosis Date  . CHF (congestive heart failure)   . Seizures   . Schizophrenia   . Diabetes mellitus without complication   . Hypertension   . Asthma   . Hepatitis A   . Hepatitis B   . Hepatitis C   . Respiratory failure   . Smoker     1 pack daily     Review of Systems  Constitutional: Negative.   HENT: Negative.   Eyes: Negative.   Respiratory: Negative.   Cardiovascular: Positive for leg swelling.  Musculoskeletal: Negative.   Skin: Negative.   Neurological: Negative.   Psychiatric/Behavioral: Negative.      Physical Exam Filed Vitals:   09/10/13 1358  BP:  120/70  Pulse: 69  Temp: 98 F (36.7 C)  Resp: 14   There is no weight on file to calculate BMI. Physical Exam  HENT:  Head: Normocephalic.  Cardiovascular: Normal rate and regular rhythm.   Pulmonary/Chest: Effort normal and breath sounds normal.  Abdominal: Soft.  Musculoskeletal:  Self-propelled in wheelchair  Neurological: She is alert.  Follow commands; inappropriate conversation sequence  Skin: Skin is warm and dry.  Psychiatric: She has a normal mood and affect.    ASSESSMENT/PLAN D/C to [facility] Provide prescription for Vimpat and Clonazepam CXR ordered- requested by [facility] facility

## 2013-09-11 ENCOUNTER — Non-Acute Institutional Stay (SKILLED_NURSING_FACILITY): Payer: Medicare Other | Admitting: Internal Medicine

## 2013-09-11 DIAGNOSIS — J479 Bronchiectasis, uncomplicated: Secondary | ICD-10-CM

## 2013-09-19 ENCOUNTER — Encounter: Payer: Self-pay | Admitting: Internal Medicine

## 2013-09-19 DIAGNOSIS — Z79899 Other long term (current) drug therapy: Secondary | ICD-10-CM | POA: Insufficient documentation

## 2013-09-19 DIAGNOSIS — R569 Unspecified convulsions: Secondary | ICD-10-CM | POA: Insufficient documentation

## 2013-09-19 NOTE — Progress Notes (Signed)
Patient ID: [name] FrancoBeverly A Berber, female   DOB: Apr 23, 1946, 68 y.o.   MRN: [id number]          PROGRESS NOTE  DATE: 07/31/2013    [facility]  LEVEL OF CARE: SNF (31)  Acute Visit  CHIEF COMPLAINT:  Manage seizure disorder.    HISTORY OF PRESENT ILLNESS: I was requested by the staff to assess the patient regarding above problem(s):  SEIZURE DISORDER: The patient's seizure disorder remains stable. No complications reported from the medications presently being used. Staff do not report any recent seizure activity.  On 07/27/2013, patient's Dilantin level was 3.6.     PAST MEDICAL HISTORY : Reviewed.  No changes.         CURRENT MEDICATIONS: [facility]  REVIEW OF SYSTEMS:  GENERAL: no change in appetite, no fatigue, no weight changes, no fever, chills or weakness RESPIRATORY: no cough, SOB, DOE,, wheezing, hemoptysis CARDIAC: no chest pain, edema or palpitations GI: no abdominal pain, diarrhea, constipation, heart burn, nausea or vomiting  PHYSICAL EXAMINATION  GENERAL: no acute distress, normal body habitus NECK: supple, trachea midline, no neck masses, no thyroid tenderness, no thyromegaly RESPIRATORY: breathing is even & unlabored, BS CTAB CARDIAC: RRR, no murmur,no extra heart sounds, no edema GI: abdomen soft, normal BS, no masses, no tenderness, no hepatomegaly, no splenomegaly PSYCHIATRIC: the patient is alert & oriented to person, affect & behavior appropriate  LABS/RADIOLOGY: On 07/27/2013:  Calcium 10.3.    At hospital discharge:  Calcium 9.2.    ASSESSMENT/PLAN:  Seizure disorder.  Stable.  We will increase Dilantin to 200 mg b.i.d.    V58.69.  Check Dilantin level in two weeks.    Hypercalcemia.  Mild.  We will monitor at this point.  The patient is on Tums.    THN Metrics:   Aspirin 81 mg q.d.  Current smoker.  BP:  132/68.    CPT CODE: [id number]

## 2013-10-13 ENCOUNTER — Emergency Department: Payer: Self-pay | Admitting: Emergency Medicine

## 2013-10-17 DIAGNOSIS — J479 Bronchiectasis, uncomplicated: Secondary | ICD-10-CM | POA: Insufficient documentation

## 2013-10-17 NOTE — Progress Notes (Signed)
Patient ID: [name] FrancoBeverly A Cosens, female   DOB: 01/16/1946, 68 y.o.   MRN: [id number]          PROGRESS NOTE  DATE: 09/11/2013    [facility]  LEVEL OF CARE: SNF (31)  Acute Visit  CHIEF COMPLAINT:  Manage bronchiectasis.    HISTORY OF PRESENT ILLNESS: I was requested by the staff to assess the patient regarding above problem(s):  This patient had a chest x-ray done yesterday to rule out PE.  However, it shows questionable bronchiectasis along the right heart border.  Patient is complaining of a productive cough and shortness of breath.   She does not have a history of COPD.    PAST MEDICAL HISTORY : Reviewed.  No changes.  CURRENT MEDICATIONS: [facility]  REVIEW OF SYSTEMS:  GENERAL: no change in appetite, no fatigue, no weight changes, no fever, chills or weakness RESPIRATORY: no cough, SOB, DOE,, wheezing, hemoptysis CARDIAC: no chest pain, edema or palpitations GI: no abdominal pain, diarrhea, constipation, heart burn, nausea or vomiting  PHYSICAL EXAMINATION  VS:  T 98.1      P 74     RR 18     BP 120/70       GENERAL: no acute distress, normal body habitus NECK: supple, trachea midline, no neck masses, no thyroid tenderness, no thyromegaly RESPIRATORY: breathing is even & unlabored, BS CTAB CARDIAC: RRR, no murmur,no extra heart sounds EDEMA/VARICOSITIES:  +1 bilateral lower extremity edema  ARTERIAL:  pedal pulses diminished   GI: abdomen soft, normal BS, no masses, no tenderness, no hepatomegaly, no splenomegaly PSYCHIATRIC: the patient is alert & oriented to person, affect & behavior appropriate  ASSESSMENT/PLAN:  Questionable bronchiectasis.  New problem.  Per radiologist recommendation, we will request a chest CT without contrast.    CPT CODE: [id number]     Angela CoxGayani Y Dasanayaka, MD [facility] [phone]

## 2013-11-20 ENCOUNTER — Inpatient Hospital Stay: Payer: Self-pay | Admitting: Internal Medicine

## 2013-11-20 LAB — COMPREHENSIVE METABOLIC PANEL
ALBUMIN: 4 g/dL (ref 3.4–5.0)
ANION GAP: 6 — AB (ref 7–16)
AST: 37 U/L (ref 15–37)
Alkaline Phosphatase: 171 U/L — ABNORMAL HIGH
BUN: 15 mg/dL (ref 7–18)
Bilirubin,Total: 0.2 mg/dL (ref 0.2–1.0)
CHLORIDE: 107 mmol/L (ref 98–107)
Calcium, Total: 9.4 mg/dL (ref 8.5–10.1)
Co2: 27 mmol/L (ref 21–32)
Creatinine: 0.49 mg/dL — ABNORMAL LOW (ref 0.60–1.30)
EGFR (African American): >60
EGFR (Non-African Amer.): >60
GLUCOSE: 88 mg/dL (ref 65–99)
OSMOLALITY: 280 (ref 275–301)
POTASSIUM: 4.2 mmol/L (ref 3.5–5.1)
SGPT (ALT): 42 U/L (ref 12–78)
SODIUM: 140 mmol/L (ref 136–145)
Total Protein: 8.2 g/dL (ref 6.4–8.2)

## 2013-11-20 LAB — CBC WITH DIFFERENTIAL/PLATELET
Basophil #: 0.1 10*3/uL (ref 0.0–0.1)
Basophil %: 0.9 %
EOS ABS: 0.1 10*3/uL (ref 0.0–0.7)
Eosinophil %: 0.9 %
HCT: 42.9 % (ref 35.0–47.0)
HGB: 13.9 g/dL (ref 12.0–16.0)
Lymphocyte #: 2.2 10*3/uL (ref 1.0–3.6)
Lymphocyte %: 35.5 %
MCH: 31.5 pg (ref 26.0–34.0)
MCHC: 32.4 g/dL (ref 32.0–36.0)
MCV: 97 fL (ref 80–100)
MONOS PCT: 11.4 %
Monocyte #: 0.7 x10 3/mm (ref 0.2–0.9)
NEUTROS ABS: 3.2 10*3/uL (ref 1.4–6.5)
Neutrophil %: 51.3 %
PLATELETS: 200 10*3/uL (ref 150–440)
RBC: 4.42 10*6/uL (ref 3.80–5.20)
RDW: 14.3 % (ref 11.5–14.5)
WBC: 6.3 10*3/uL (ref 3.6–11.0)

## 2013-11-20 LAB — URINALYSIS, COMPLETE
BILIRUBIN, UR: NEGATIVE
Blood: NEGATIVE
GLUCOSE, UR: NEGATIVE mg/dL (ref 0–75)
Ketone: NEGATIVE
Nitrite: NEGATIVE
PH: 8 (ref 4.5–8.0)
PROTEIN: NEGATIVE
RBC,UR: 2 /HPF (ref 0–5)
Specific Gravity: 1.004 (ref 1.003–1.030)
Squamous Epithelial: 1
WBC UR: 15 /HPF (ref 0–5)

## 2013-11-20 LAB — PHENYTOIN LEVEL, TOTAL: Dilantin: 38.3 ug/mL (ref 10.0–20.0)

## 2013-11-20 LAB — PROTIME-INR
INR: 1.2
PROTHROMBIN TIME: 14.8 s — AB (ref 11.5–14.7)

## 2013-11-21 ENCOUNTER — Ambulatory Visit: Payer: Self-pay | Admitting: Neurology

## 2013-11-21 LAB — COMPREHENSIVE METABOLIC PANEL
ALK PHOS: 145 U/L — AB
Albumin: 3.2 g/dL — ABNORMAL LOW (ref 3.4–5.0)
Anion Gap: 5 — ABNORMAL LOW (ref 7–16)
BUN: 14 mg/dL (ref 7–18)
Bilirubin,Total: 0.3 mg/dL (ref 0.2–1.0)
CALCIUM: 9.5 mg/dL (ref 8.5–10.1)
CREATININE: 0.52 mg/dL — AB (ref 0.60–1.30)
Chloride: 103 mmol/L (ref 98–107)
Co2: 29 mmol/L (ref 21–32)
EGFR (African American): >60
EGFR (Non-African Amer.): >60
Glucose: 97 mg/dL (ref 65–99)
OSMOLALITY: 274 (ref 275–301)
POTASSIUM: 3.9 mmol/L (ref 3.5–5.1)
SGOT(AST): 21 U/L (ref 15–37)
SGPT (ALT): 31 U/L (ref 12–78)
Sodium: 137 mmol/L (ref 136–145)
Total Protein: 7.1 g/dL (ref 6.4–8.2)

## 2013-11-21 LAB — CBC WITH DIFFERENTIAL/PLATELET
Basophil #: 0 10*3/uL (ref 0.0–0.1)
Basophil %: 0.8 %
Eosinophil #: 0.1 10*3/uL (ref 0.0–0.7)
Eosinophil %: 2.1 %
HCT: 39.4 % (ref 35.0–47.0)
HGB: 13 g/dL (ref 12.0–16.0)
Lymphocyte #: 2 10*3/uL (ref 1.0–3.6)
Lymphocyte %: 54.1 %
MCH: 31.8 pg (ref 26.0–34.0)
MCHC: 32.9 g/dL (ref 32.0–36.0)
MCV: 97 fL (ref 80–100)
Monocyte #: 0.5 x10 3/mm (ref 0.2–0.9)
Monocyte %: 13.3 %
Neutrophil #: 1.1 10*3/uL — ABNORMAL LOW (ref 1.4–6.5)
Neutrophil %: 29.7 %
Platelet: 173 10*3/uL (ref 150–440)
RBC: 4.08 10*6/uL (ref 3.80–5.20)
RDW: 14.4 % (ref 11.5–14.5)
WBC: 3.8 10*3/uL (ref 3.6–11.0)

## 2013-11-21 LAB — PHENYTOIN LEVEL, TOTAL: Dilantin: 25.9 ug/mL — ABNORMAL HIGH (ref 10.0–20.0)

## 2013-11-21 LAB — URINE CULTURE

## 2014-03-11 ENCOUNTER — Emergency Department: Payer: Self-pay | Admitting: Emergency Medicine

## 2014-08-27 ENCOUNTER — Encounter: Payer: Self-pay | Admitting: General Surgery

## 2014-08-30 ENCOUNTER — Encounter: Payer: Self-pay | Admitting: General Surgery

## 2014-09-02 ENCOUNTER — Emergency Department: Payer: Self-pay | Admitting: Emergency Medicine

## 2014-09-17 ENCOUNTER — Encounter: Payer: Self-pay | Admitting: Surgery

## 2014-09-26 ENCOUNTER — Emergency Department: Payer: Self-pay | Admitting: Student

## 2014-09-26 LAB — PROTIME-INR
INR: 1
Prothrombin Time: 13 secs (ref 11.5–14.7)

## 2014-09-26 LAB — APTT: Activated PTT: 30.3 secs (ref 23.6–35.9)

## 2014-09-30 ENCOUNTER — Encounter: Payer: Self-pay | Admitting: Surgery

## 2014-10-24 ENCOUNTER — Emergency Department: Payer: Self-pay | Admitting: Emergency Medicine

## 2014-10-29 ENCOUNTER — Encounter
Admit: 2014-10-29 | Disposition: A | Discharge status: Home / Self Care | Payer: Self-pay | Admitting: Cardiothoracic Surgery

## 2014-10-29 ENCOUNTER — Encounter: Admit: 2014-10-29 | Disposition: A | Discharge status: Home / Self Care | Payer: Self-pay | Admitting: Surgery

## 2014-12-20 NOTE — Consult Note (Signed)
Brief Consult Note: Diagnosis: abnormal CT/ coltiis, diarrheal illness.   Patient was seen by consultant.   Consult note dictated.   Recommend further assessment or treatment.   Comments: Patient seen and examined, full consult dictated.  Patietn presenting with ams. sepsis/respiratory failure.  Currently off vent.  continues with diarrheal/softw watery stool, now off lactulose.  Hyperammonemia on admission, now normal.  History of etoh and substance abuse, paranoid schizophrenia. Currently denies n/v ofr abdominal pain, states she had none prior to admission.  Stool studies negative for multiple infective organism, but of note not bloody at any poin in the admission.  Doubt ischemia, likely infective.  Recommend repeat CT with contrast in 2-3 days to reevaluate.  Would hold colonoscopy for now in consideration of her recent respiratory failure and concern for sedated proceedure at present.  This can be done in a few weeks.  If stools continue loose (now that she is off lactulose) repeat c. diff.  Will follow with you.  Electronic Signatures: Barnetta ChapelSkulskie, Reymundo Winship (MD)  (Signed 22-Jun-14 15:27)  Authored: Brief Consult Note   Last Updated: 22-Jun-14 15:27 by Barnetta ChapelSkulskie, Derl Abalos (MD)

## 2014-12-20 NOTE — H&P (Signed)
PATIENT NAME:  [name]#:  [id number] DATE OF BIRTH:  [birth year]  DATE OF ADMISSION:  02/13/2013  REFERRING PHYSICIAN:  Dorothea Glassman, MD  PRIMARY CARE PHYSICIAN: At nursing home.   HISTORY OF PRESENT ILLNESS: Unable to obtain from the patient due to altered mental status, the patient intubated and sedated. What we know about this patient is from a previous admission on behavioral health in 2007 and whatever information I was able to get from the nursing home.   As far as we know, she is a 69 year old lady who is a single who is seen by Psychiatry due to paranoid delusions with past medical history of diabetes, hypertension, asthma, probable hepatitis. She has history of chronic paranoid schizophrenia and alcohol dependence in the past with multiple psychiatric hospitalizations. The patient comes today with a history of being found at her home to be slumped over, unresponsive since she was found.  We do not have a date of the last time being seen normally or time of it today.  The patient needed to be intubated here in the ER. She moved extremities only on withdrawal to pain whenever she was getting the I and O catheters, and apparently there was a description of possible right eye deviation. The patient had very poor IV access for what IO placement on both legs was done. CT of the head showed only atrophy with minimal white matter disease without any significant new strokes.  Apparently, there is some concern for hypoxia, possible seizure. Dr. Mellody Drown has been consulted, and he recommended to treat it as postictal, and he doubted that there was a stroke at this moment since there were not any focal deficits.   He recommended an EEG and MRI of the brain without contrast, but at this moment the patient is ventilated. The patient is being difficult to manage as her blood pressure is still very low, 70s/40s is the maximum we have got on dopamine at 35 mcg and Levophed running at 50 right now.    A central line was placed by me, and so far the patient has remained unresponsive. Not able to get any other history. The family was trying to contact us over here, although I have not been able to contact them myself.   PAST MEDICAL HISTORY: 1.  Type 2 diabetes.  2.  Peptic ulcer disease.  3.  Hypertension.  4.  Asthma.  5.  History of hepatitis A, B or C which has been questionable.  6.  Paranoid schizophrenia which is chronic.  FAMILY HISTORY: Unobtainable at this moment as the patient is sedated.   SOCIAL HISTORY: The patient is from [place], going to [facility], and now she is in a family care home. Apparently the patient has been a heavy drinker but not recently, and there is no notice about smoking at this moment. Apparently, the patient used to use cocaine in the past.   PAST SURGICAL HISTORY:  Not able to obtain.   ALLERGIES: THE PATIENT IS ALLERGIC TO DEPAKOTE, HALDOL, PENICILLIN, TRILEPTAL AND SHELLFISH.   CURRENT MEDICATIONS:   1.  Vitamin B 100 once a day.  2.  Seroquel XR 200 mg every night.  3.  Tylenol p.r.n. pain.  4.  Aspirin 81 mg once daily.  5.  Benazepril 10 mg once daily.  6.  Clonidine 0.1 mg twice daily.  7.  Clonazepam 1 mg 3 times a day. 8.  Depakote ER 500 mg extended-release, take 3 tablets once a day.  9.  Invega Sustenna intramuscular extended release once a month.  10.  Amantadine 100 mg at bedtime.  11.  Metoprolol ER 50 mg 0.5 tablets twice daily.  12.  Amlodipine 10 mg once a day.  13.  Hydrochlorothiazide 25 mg once a day.  14.  Docusate 100 mg 2 times daily.  15.  MiraLax 17 grams once a day.   PHYSICAL EXAMINATION: VITAL SIGNS: Blood pressures as mentioned above right now are 70s/40s, heart rate is in the 90s. The patient is on a ventilator, assist control 16, 500 tidal volume, FiO2 of 100 and a PEEP of 5. The patient is unresponsive, critically ill, toxic-looking.  Her pupils are about 6 mm, very sluggish response. No movement of  ocular muscles at this moment. The patient is sedated and intubated. No oral lesions. No oropharyngeal exudates. Very dry mucosa.  NECK: Supple. No JVD. No thyromegaly. No adenopathy. No carotid bruits. No rigidity. No masses.  CARDIOVASCULAR: Regular rate and rhythm. No murmurs, rubs, or gallops are appreciated at this moment. Slightly tachycardic in the higher 90s. No tenderness to palpation of anterior chest wall. The patient is not responding to sternal rub.  No displacement of PMI.  LUNGS: For the most, clear without any wheezing or crepitus. No use of accessory muscles, well ventilated on the ventilator. No signs of pneumonia.  ABDOMEN: Soft, nontender, nondistended. No hepatosplenomegaly. No masses. Bowel sounds are positive.  GENITOURINARY:  Negative for external lesions. Large amount of diarrhea liquid on the diaper.  EXTREMITIES: No edema, cyanosis or clubbing. Pulses are very weak due to shock. Skin is cold peripherally, no cyanosis, no clubbing.  MUSCULOSKELETAL: No significant joint abnormalities. No joint effusions.  VASCULAR: Capillary refill is about 5 to 6 seconds.  SKIN:  With decreased turgor, no rashes.  NEUROLOGIC: Unable to fully assess due to sedation. The patient is not moving any extremities. The patient is not reacting to pain, and Babinski's are upright.   PSYCHIATRIC:  Unable to assess.   LABORATORY AND RADIOLOGICAL DATA:  Glucose 74, BUN 80, creatinine 2.14, potassium is 5.3. GFR is around 24, total protein 7, bilirubin 0.8, albumin 2.8, AST is 400, ALT is 137. There are no recent previous labs. Troponin is normal. TSH is 2.15. White count is 4, hemoglobin is 13.7, platelets 174. ABG with a pH of 7.13, pCO2 of 53, PaO2 of 45. Her HCO3 is 17 with CO2 of 22.   Chest x-ray after central placement:  No complications. No signs of exudates or consolidations. CT of the head as mentioned in history of present illness.   ASSESSMENT AND PLAN: This is a 69 year old female found  unresponsive in her family group home who is not responsive at this moment, intubated and sedated.   1.  Altered mental status:  This is likely to be related to metabolic encephalopathy either due to sepsis versus seizure disorder versus stroke. Dr. Katrinka BlazingSmith has evaluated the patient. He believes this is not a stroke. He recommended an MRI and an EEG.   At this moment, we are not able to do the MRI as the patient is critically ill, cannot go for that long. Her blood pressure is very low. We can get an EEG, probably, if the patient survives the night. She is critically ill.  We can look for a septic process as the patient has mainly flowing liquid diarrhea. This could be Clostridium difficile. For now, we are going to cover her with vancomycin and meropenem, but we are going  to add on Flagyl as another option after the C. diff  tox is obtained.  2.  Respiratory failure: The patient needed to be intubated. She has hypoxic respiratory failure, likely due to the altered mental status and possible septic process. The patient was very hypoxic, with pO2 in the 40s.  3.  Shock:  The patient has likely septic shock, and this could be related to a GI process. Based on the diarrhea, the patient could be really dehydrated and also septic.  The patient does not have a fever. She did not have any white blood count, although she is tachycardic. We are going to get a lactic acid right now and evaluate her for that.  For now, we are going to call it hypovolemic shock as the patient is severely dehydrated.  4.  Diabetes:  We are going to do Accu-Cheks and start her on insulin coverage as necessary. For now, her blood sugars are normal.  5.  Seizure disorder:  The patient is on fosphenytoin right now.  She was taking Keppra before.  6.  Elevated liver function tests, questionable history of hepatitis:  For now, we are going to check a hepatitis panel.  7.  Acute kidney injury:  Likely the patient has an elevation of creatinine  from previous labs done a long time ago. From her history, she does not have chronic kidney disease for what this is likely to be acute. We are going to rule out rhabdomyolysis, get a CK. As far as her kidney failure, we are going to give her more fluid. This is her fifth bolus of 1 liter.  8.  Elevated potassium:   Likely due to acute kidney failure. We are going to continue to monitor at this moment.  We are just going to do fluid resuscitation.  9.  Hypertension: Hold blood pressure medications.  10.  Asthma:  At this moment, the patient is well ventilated. No signs of exacerbation.  11.  Shock: Again, we are going to add on steroids as the patient is not responding for pressors.  12.  The patient is a very sick patient at this moment, and we are going to try to give her as much support as we can, but her chances of survival are very poor.  13.  Deep vein thrombosis prophylaxis with Lovenox,  gastrointestinal prophylaxis Protonix.  changed to DNR  spoke with son.  Flint Melter [phone] TIME SPENT:   The patient is critically ill. I spent about 90 minutes with this patient without accounting for procedures.  ____________________________ Felipa Furnace, MD rsg:cb D: 02/13/2013 16:18:32 ET T: 02/13/2013 16:47:29 ET JOB#: [id number]  cc: Felipa Furnace, MD, <Dictator> Jakia Kennebrew Juanda Chance MD ELECTRONICALLY SIGNED 02/13/2013 18:26

## 2014-12-20 NOTE — Consult Note (Signed)
Referring Physician:  James Ivanoff, Roselie Awkward :   Primary Care Physician:  James Ivanoff, [facility] : [facility], [address], [place], [place] [address], [place] [phone]  Reason for Consult: Admit Date: 13-Feb-2013  Chief Complaint: unresponsiveness  Reason for Consult: altered mental status   History of Present Illness: History of Present Illness:   69 yo RHD F presents from NH after an episode of unresponsiveness while eating.  Pt was apparently having lunch when she passed out and became unresponsive.  She arrived at [facility] via ambulance and still remained unresponsive and there was some R eye deviation per ER physician.  She did not have any focal deficits so was not given tPA.  She was loaded with dilantin overnight for possible seizure but has not improved.  ROS:  Review of Systems   unobtainable secondary to mental status  Past Medical/Surgical Hx:  Hepatitis C:   Hepatitis B:   Diabetes:   htn:   Schizophrenia:   Depression:   Asthma:   Hysterectomy - Partial:   Past Medical/ Surgical Hx:  Past Medical History as above, ?? seizure   Past Surgical History as above   Home Medications: Medication Instructions Last Modified Date/Time  hydrochlorothiazide 25 mg oral tablet 1 tab(s) orally once a day 17-Jun-14 14:15  benazepril 10 mg oral tablet 1 tab(s) orally once a day 17-Jun-14 14:15  aspirin 81 mg oral tablet 1 tab(s) orally once a day 17-Jun-14 14:15  Vitamin B-100 Vitamin B Complex with C oral tablet, extended release 1 tab(s) orally once a day 17-Jun-14 14:15  amLODIPine 10 mg oral tablet 1 tab(s) orally once a day 17-Jun-14 14:15  Metoprolol Succinate ER 50 mg oral tablet, extended release 0.5 tab(s) orally 2 times a day 17-Jun-14 14:15  MiraLax - oral powder for reconstitution 17 gram(s) orally once a day 17-Jun-14 14:15  docusate sodium sodium 100 mg oral capsule 1 cap(s) orally 2 times a day 17-Jun-14 14:15  cloNIDine 0.1 mg oral tablet  1 tab(s) orally 2 times a day 17-Jun-14 14:15  clonazePAM 1 mg oral tablet 1 tab(s) orally 3 times a day 17-Jun-14 14:15  acetaminophen 500 mg oral tablet 1 tab(s) orally every 6 hours 17-Jun-14 14:15  amantadine 100 mg oral capsule 1 cap(s) orally once a day (at bedtime) 17-Jun-14 14:15  SEROquel XR 200 mg oral tablet, extended release 1 tab(s) orally once a day (in the evening) 17-Jun-14 14:15  Depakote ER 500 mg oral tablet, extended release 3 tab(s) orally once a day 17-Jun-14 14:15  Invega Sustenna 234 mg/1.5 mL intramuscular suspension, extended release 1.5 milliliter(s) intramuscular once a month 17-Jun-14 14:15   Allergies:  PCN: Rash  Trileptal: Unknown  Depakote: Unknown  Haldol: Unknown  Shellfish: Unknown  Allergies:  Allergies see list   Social/Family History: Employment Status: disabled  Lives With: alone  Living Arrangements: assisted living  Social History: no tob, no EtOH, no illicits  Family History: unobtainable   Vital Signs: **Vital Signs.:   18-Jun-14 07:00  Vital Signs Type Routine  Pulse Pulse 136  Respirations Respirations 23  Systolic BP Systolic BP 87  Diastolic BP (mmHg) Diastolic BP (mmHg) 55  Mean BP 65  Pulse Ox % Pulse Ox % 100  Oxygen Delivery Ventilator Assisted  Pulse Ox Heart Rate 136   Physical Exam: General: ill appearing in ICU, appropiate weight  HEENT: normocephalic, sclera icteric, oropharynx clear  Neck: supple, no JVD, no bruits  Chest: CTA B, no wheezes, good movement  Cardiac: tachycardic,  no murmur, weak pulses  Extremities: diaphoretic, no C/C/E, FROM   Neurologic Exam: Mental Status: intubated, not sedated, does not open or follow, GCS 3T  Cranial Nerves: pupils 35m B and reactive, weak corneals B, L eye deviation but Dolls intact, good cough  Motor Exam: no movement to pain, flaccid tone  Deep Tendon Reflexes: 1+/4 B, mute plantars  Sensory Exam: somewhat grimaces to pain???  Coordination: untestable   Lab  Results:  Thyroid:  17-Jun-14 13:29   Thyroid Stimulating Hormone 2.15 (0.45-4.50 (International Unit)  ----------------------- Pregnant patients have  different reference  ranges for TSH:  - - - - - - - - - -  Pregnant, first trimetser:  0.36 - 2.50 uIU/mL)  Hepatic:  18-Jun-14 01:40   Bilirubin, Total 0.7  Alkaline Phosphatase 85  SGPT (ALT)  139  SGOT (AST)  373  Total Protein, Serum  5.6  Albumin, Serum  2.2  Routine Micro:  17-Jun-14 14:28   Specimen Source INDWELLING CATHETER    19:16   Micro Text Report BLOOD CULTURE   COMMENT                   NO GROWTH IN 8-12 HOURS   ANTIBIOTIC                       Culture Comment NO GROWTH IN 8-12 HOURS  Result(s) reported on 14 Feb 2013 at 07:31AM.  Routine Chem:  17-Jun-14 15:56   Ammonia, Plasma  242 (Result(s) reported on 13 Feb 2013 at 029:52WU)  Folic Acid, Serum 113.2(Result(s) reported on 13 Feb 2013 at 06:04PM.)  18-Jun-14 01:40   BUN  60  Creatinine (comp)  2.00  Sodium, Serum  124  Potassium, Serum 3.5  Chloride, Serum  89  CO2, Serum  17  Calcium (Total), Serum  6.3  Osmolality (calc) 310  eGFR (African American)  30  eGFR (Non-African American)  26 (eGFR values <640mmin/1.73 m2 may be an indication of chronic kidney disease (CKD). Calculated eGFR is useful in patients with stable renal function. The eGFR calculation will not be reliable in acutely ill patients when serum creatinine is changing rapidly. It is not useful in  patients on dialysis. The eGFR calculation may not be applicable to patients at the low and high extremes of body sizes, pregnant women, and vegetarians.)  Anion Gap  18  Magnesium, Serum  1.0 (1.8-2.4 THERAPEUTIC RANGE: 4-7 mg/dL TOXIC: > 10 mg/dL  -----------------------)  Hemoglobin A1c ([facility])  6.7 (The American Diabetes Association recommends that a primary goal of therapy should be <7% and that physicians should reevaluate the treatment regimen in patients with HbA1c values  consistently >8%.)  Cholesterol, Serum 120  Triglycerides, Serum 147  HDL (INHOUSE) 41  VLDL Cholesterol Calculated 29  LDL Cholesterol Calculated 50 (Result(s) reported on 14 Feb 2013 at 02:46AM.)    06:10   Result Comment GLUCOSE - RESULTS VERIFIED BY REPEAT TESTING.  - NOTIFIED OF CRITICAL VALUE  - C/CARLA FOUST AT 0729 02/14/13-LAB  - READ-BACK PROCESS PERFORMED.  Result(s) reported on 14 Feb 2013 at 07:30AM.  Glucose, Serum  730  Urine Drugs:  [id number] Tricyclic Antidepressant, Ur Qual (comp) NEGATIVE (Result(s) reported on 13 Feb 2013 at 05:01PM.)  Amphetamines, Urine Qual. NEGATIVE  MDMA, Urine Qual. NEGATIVE  Cocaine Metabolite, Urine Qual. NEGATIVE  Opiate, Urine qual NEGATIVE  Phencyclidine, Urine Qual. NEGATIVE  Cannabinoid, Urine Qual. NEGATIVE  Barbiturates, Urine Qual. NEGATIVE  Benzodiazepine,  Urine Qual. NEGATIVE (----------------- The URINE DRUG SCREEN provides only a preliminary, unconfirmed analytical test result and should not be used for non-medical  purposes.  Clinical consideration and professional judgment should be  applied to any positive drug screen result due to possible interfering substances.  A more specific alternate chemical method must be used in order to obtain a confirmed analytical result.  Gas chromatography/mass spectrometry (GC/MS) is the preferred confirmatory method.)  Methadone, Urine Qual. NEGATIVE  Cardiac:  17-Jun-14 13:29   Troponin I < 0.02 (0.00-0.05 0.05 ng/mL or less: NEGATIVE  Repeat testing in 3-6 hrs  if clinically indicated. >0.05 ng/mL: POTENTIAL  MYOCARDIAL INJURY. Repeat  testing in 3-6 hrs if  clinically indicated. NOTE: An increase or decrease  of 30% or more on serial  testing suggests a  clinically important change)    21:56   CK, Total  8994  Routine UA:  17-Jun-14 14:28   Color (UA) Yellow  Clarity (UA) Clear  Glucose (UA) Negative  Bilirubin (UA) Negative  Ketones (UA) Trace  Specific  Gravity (UA) 1.019  Blood (UA) Negative  pH (UA) 5.0  Protein (UA) Negative  Nitrite (UA) Negative  Leukocyte Esterase (UA) Negative (Result(s) reported on 13 Feb 2013 at 04:33PM.)  RBC (UA) <1 /HPF  WBC (UA) <1 /HPF  Bacteria (UA) NONE SEEN  Epithelial Cells (UA) <1 /HPF  Hyaline Cast (UA) 1 /LPF (Result(s) reported on 13 Feb 2013 at 04:33PM.)  Routine Hem:  18-Jun-14 01:40   WBC (CBC) 4.8  RBC (CBC) 3.90  Hemoglobin (CBC) 13.2  Hematocrit (CBC) 41.4  Platelet Count (CBC)  147  MCV  106  MCH 34.0  MCHC 32.0  RDW 14.0  Neutrophil % 83.0  Lymphocyte % 10.4  Monocyte % 6.4  Eosinophil % 0.1  Basophil % 0.1  Neutrophil # 4.0  Lymphocyte #  0.5  Monocyte # 0.3  Eosinophil # 0.0  Basophil # 0.0 (Result(s) reported on 14 Feb 2013 at 02:34AM.)   Radiology Results: CT:    17-Jun-14 13:46, CT Head Without Contrast  CT Head Without Contrast   REASON FOR EXAM:    altered mental status  COMMENTS:       PROCEDURE: CT  - CT HEAD WITHOUT CONTRAST  - Feb 13 2013  1:46PM     RESULT: Noncontrast CT of the brain is performed. The patient has no   previous exam for comparison.    There is mild prominence of the ventricles and sulci consistent with   atrophy. There is no intracranial hemorrhage, mass, mass effect or   midline shift. No territorial infarct is evident. The included sinuses   and mastoid air cells show normal appearing aeration. The calvarium   appears intact.    IMPRESSION:   1. Age-appropriate atrophy. No acute intracranial abnormality.    Dictation Site: 1        Verified By: Sundra Aland, M.D., MD   Radiology Impression: Radiology Impression: CT of head personally reviewed by me and shows mild atrophy and white matter changes   Impression/Recommendations: Recommendations:   labs reviewed by me and highly abnormal d/w Dr. Mortimer Fries notes reviewed   Probable status epilepticus-  there are random spike waves seen on EEG to support this but there is no  ictal runs on EEG but this does not mean that pt is not in status which is more a clinical diagnosis Severe encephalopathy-  this is mostly toxic metabolic and EEG supports this more than seizures.  Elevated BUN and ammonia are most common causes pluse valproic acid can raise the ammonia in carnitine deficiency. Multisystem failure with elevated lactate-  will most likely cause pt demise but will support all neurologic needs add Keppra 721m BID continue Dilantin 1048mTID and check level add L-carnitine 6gm IV x 1 add Lactulose 3045m8h consider switching Meropenum because it lowers seizure threshold will repeat EEG tomorrow or Friday if pt survives and does not improve will follow minutes of critical care time spent, evaluating pt, reviewing images and coordinating care.  Electronic Signatures: SmiJamison NeighborD)  (Signed 18-Jun-14 12:15)  Authored: REFERRING PHYSICIAN, Primary Care Physician, Consult, History of Present Illness, Review of Systems, PAST MEDICAL/SURGICAL HISTORY, HOME MEDICATIONS, ALLERGIES, Social/Family History, NURSING VITAL SIGNS, Physical Exam-, LAB RESULTS, RADIOLOGY RESULTS, Recommendations   Last Updated: 18-Jun-14 12:15 by SmiJamison NeighborD)

## 2014-12-20 NOTE — Consult Note (Signed)
PATIENT NAME:  [name], GUINYARD MR#:  [id number] DATE OF BIRTH:  1946-05-30  DATE OF CONSULTATION:  02/18/2013  REFERRING PHYSICIAN:  Rock Hall Sink, MD   CONSULTING PHYSICIAN:  Lollie Sails, MD  REASON FOR CONSULTATION: Colitis, possibly ischemic versus infective.   HISTORY OF PRESENT ILLNESS: Ms. Bin is a 69 year old African American female who was admitted to the hospital on 02/13/2013 after having been found at home with altered mental status, unresponsive. She was admitted with a diagnosis of shock, respiratory failure and sepsis. An abdominal CT scan was done on the 18th which showed possible colitis, ischemic versus infective. Over the course of her hospitalization, she has responded to treatment with antibiotics, and actually this morning she extubated herself. I was able to talk with her a bit today, and she denied any problems with abdominal pain either currently or before the hospitalization. There was no nausea or vomiting. She has no personal history of peptic ulcer disease. She states she has never had a colonoscopy or an EGD. There is no heartburn or dysphagia. It is of note that there was a report of some diarrhea on admission, and she did have diarrhea earlier in the admission. She currently has a rectal tube with a watery loose stool.   GI FAMILY HISTORY: Negative for colorectal cancer.   PAST MEDICAL HISTORY:  1.  The patient has a history of a psychiatric disorder with chronic paranoid schizophrenia as well as substance abuse, both alcohol and cocaine.  2.  Type 2 diabetes.  3.  Peptic ulcer disease. I am uncertain as to the exact history on this.  4.  Hypertension.  5.  Asthma.  6.  History of hepatitis, uncertain type.  7.  Paranoid schizophrenia as noted.   SOCIAL HISTORY: The patient has been going to [facility]. She is now in a family care home. She has not been drinking alcohol recently, heavy drinker in the past, as well as other substance abuse.    CURRENT OUTPATIENT MEDICATIONS: 1.  Acetaminophen 500 mg, 1 every 6 hours p.r.n.   2.  Amantadine 100 mg before bedtime.  3.  Amlodipine 10 mg once a day.  4.  Aspirin 81 mg daily.  5.  Benazepril 10 mg once a day.  6.  Clonazepam 1 mg 3 times a day.  7.  Clonidine 0.1 mg twice a day.  8.  Depakote ER 500 extended-release 3 tablets once a day.  9.  Docusate sodium 100 mg capsule twice a day. 10.  Hydrochlorothiazide 25 mg once a day. 11.  Invega Sustenna intramuscular injection once a month.  12.  Metoprolol succinate ER 50 mg 1/2 tablet twice a day.  13.  MiraLax 1 dose daily.  14.  Seroquel XR 200 once a day.  15.  Vitamin B 100 B complex once a day.   ALLERGIES: SHE HAS RECORDED ALLERGY OF DEPAKOTE, HALDOL, PENICILLIN, TRILEPTAL AND SHELLFISH. IT IS OF NOTE THAT THE PATIENT WAS ON DEPAKOTE WHEN SHE CAME IN, APPARENTLY.  PHYSICAL EXAMINATION: VITAL SIGNS: Temperature is 98, pulse 116, respirations 30, blood pressure 93/49, pulse oximetry 100.  GENERAL: She is a 69 year old African American female in no acute distress. She is oriented x 1.  HEENT: Normocephalic, atraumatic. Eyes: Anicteric. Nose: Septum midline. No lesions. Oropharynx: No lesions.  NECK: No JVD.  HEART: Regular rate and rhythm.  LUNGS: Clear.  ABDOMEN: Soft, nontender, nondistended. Bowel sounds positive, normoactive. There is a mild discomfort laterally both sides of the abdomen. There are  no masses, rebound or organomegaly.  RECTAL: Anorectal exam deferred.  EXTREMITIES: No clubbing, cyanosis or edema.  NEUROLOGICAL: Cranial nerves II through XII grossly intact. Muscle strength bilaterally equal and symmetric. Deep tendon reflexes bilaterally equal and symmetric.     LABORATORY AND RADIOLOGICAL DATA:  This morning's labs include a glucose of 161, BUN 22, creatinine 0.70, sodium 147, potassium 3.4, chloride 112, bicarbonate 28, calcium 7.7, phosphorus 1.5, magnesium 1.5. Hepatic profile showing a total protein  of 4.9, albumin 1.8, total bilirubin 0.2, alk phos 83, AST 77, ALT 64. Hemogram showing a white count of 7.5, H and H 10.4/29.6, platelet count 159, MCV is 100.   She had an abdominal CT scan on 02/14/2013, this showing findings of colitis involving the descending and sigmoid colon, mild wall thickening of the ascending colon and hepatic flexure. Differential includes infectious, inflammatory and ischemic. Mild increased attenuation of the bladder. She had a CT scan of the head on that same date showing no acute intracranial process. Several days ago was her last chest film showing shallow inspiration without evidence of acute cardiopulmonary disease. She had an ultrasound of her right arm showing no sonographic evidence of DVT in the interrogated vessels of the right upper extremity.   ASSESSMENT:  The patient was admitted with altered mental status, diarrheal illness and septic shock. She has been on antibiotics and is now off the ventilator, relatively stable. She continues to have a loose stool and a rectal tube. She presented with a hyperammonemia in the setting of a history of alcohol abuse in the past. Currently, however, as of several days ago her ammonia was down to 28. She continues to have a loose stool with rectal tube. CT scan finding of colonic thickening consistent with colitis uncertain etiology.   RECOMMENDATIONS: 1.  The patient currently seems to be much improved from her initial presentation. Her hyperammonemia has cleared quite well, and I would reduce the lactulose to perhaps 1 dose daily.  2.  In regards to the finding on the CT scan:  With her just now coming off of the ventilator and having experienced respiratory failure, would hold on doing sedated procedures such as colonoscopy for now. However, I would recommend a CT scan with at least oral contrast at the 1-week mark from the previous. If her creatinine continues to be good, would also do it with IV contrast as well for  evaluation of the mesenteric aortic takeoffs.  3.  In regards to her history of questionable hepatitis, we will obtain chronic hepatitis serologies.  ____________________________ Lollie Sails, MD mus:cb D: 02/18/2013 15:18:03 ET T: 02/18/2013 16:06:22 ET JOB#: [id number]  cc: Lollie Sails, MD, <Dictator> Lollie Sails MD ELECTRONICALLY SIGNED 03/26/2013 7:13

## 2014-12-20 NOTE — Consult Note (Signed)
Chief Complaint:  Subjective/Chief Complaint patietn seen for abnormal ct, colitis.  Patient doing better, tolerating po, no diarrhea for 2 days.   VITAL SIGNS/ANCILLARY NOTES: **Vital Signs.:   24-Jun-14 17:10  Vital Signs Type Routine  Temperature Temperature (F) 98.7  Celsius 37  Temperature Source oral  Pulse Pulse 114  Respirations Respirations 31  Systolic BP Systolic BP 349  Diastolic BP (mmHg) Diastolic BP (mmHg) 60  Mean BP 93  Pulse Ox % Pulse Ox % 92  Oxygen Delivery 2L; Nasal Cannula  Pulse Ox Heart Rate 94   Brief Assessment:  Cardiac Regular  tachycardia   Respiratory clear BS   Gastrointestinal details normal Soft  Nontender  Nondistended  No masses palpable  Bowel sounds normal   Lab Results: Hepatic:  24-Jun-14 04:12   Albumin, Serum  2.3 (Result(s) reported on 20 Feb 2013 at 09:58AM.)  TDMs:  24-Jun-14 04:12   Dilantin, Serum 13.5 (Result(s) reported on 20 Feb 2013 at 04:49AM.)  General Ref:  23-Jun-14 05:32   Hepatitis A Antibody, Total ========== TEST NAME ==========  ========= RESULTS =========  = REFERENCE RANGE =  HEPATITIS A ANTIBDY,TOTL  Hep A Ab, Total Hep A Ab, Total                 [   Positive             ]          Negative               [facility]   No: [id number]           [address], [place], [place] [address]           Lindon Romp, MD         [phone]   Result(s) reported on 20 Feb 2013 at 09:19AM.  Hepatitis B Surface Antibody, Qual ========== TEST NAME ==========  ========= RESULTS =========  = REFERENCE RANGE =  HEPATITIS B SURF.AB,QUAL  Hep B Surface Ab Hep B Surface Ab, Qual          [   Non Reactive         ]                                                Non Reactive: Inconsistent with immunity,                                            less than 10 mIU/mL                              Reactive:     Consistent with immunity,                                            greater than 9.9 mIU/mL                [facility]            No: [id number]           [address]  [place], [place] [address]           Lindon Romp, MD         [phone]   Result(s) reported on 20 Feb 2013 at 09:19AM.  HBsAg ========== TEST NAME ==========  ========= RESULTS =========  = REFERENCE RANGE =  HEPATITIS B SURFACE AG  HBsAg Screen HBsAg Screen                    [   Confirm. indicated   ]          Negative HBsAg Confirmation              [   Positive         ]                   .               [facility]            No: [id number]           [address] [place], [place] [address]           Lindon Romp, MD         [phone]   Result(s) reported on 20 Feb 2013 at 05:53PM.  Routine Chem:  24-Jun-14 04:12   Glucose, Serum  135  BUN  20  Creatinine (comp) 0.66  Sodium, Serum  148  Potassium, Serum 3.8  Chloride, Serum  114  CO2, Serum 28  Calcium (Total), Serum  8.0  Anion Gap  6  Osmolality (calc) 299  eGFR (African American) >60  eGFR (Non-African American) >60 (eGFR values <43m/min/1.73 m2 may be an indication of chronic kidney disease (CKD). Calculated eGFR is useful in patients with stable renal function. The eGFR calculation will not be reliable in acutely ill patients when serum creatinine is changing rapidly. It is not useful in  patients on dialysis. The eGFR calculation may not be applicable to patients at the low and high extremes of body sizes, pregnant women, and vegetarians.)  Result Comment LABS - This specimen was collected through an   - indwelling catheter or arterial line.  - A minimum of 571m of blood was wasted prior    - to collecting the sample.  Interpret  - results with caution.  - TPL  Result(s) reported on 20 Feb 2013 at 04:49AM.  Magnesium, Serum  1.4 (1.8-2.4 THERAPEUTIC RANGE: 4-7 mg/dL TOXIC: > 10 mg/dL  -----------------------)  Phosphorus, Serum 2.7 (Result(s) reported on 20 Feb 2013 at 04:49AM.)   Routine Hem:  24-Jun-14 04:12   WBC (CBC) 9.1  RBC (CBC)  2.68  Hemoglobin (CBC)  9.3  Hematocrit (CBC)  27.2  Platelet Count (CBC) 235 (Result(s) reported on 20 Feb 2013 at 04:49AM.)  MCV  101  MCH  34.6  MCHC 34.1  RDW  14.7  Bands 1  Segmented Neutrophils 48  Lymphocytes 42  Monocytes 7  Eosinophil 1  Myelocyte 1  Diff Comment 1 ANISOCYTOSIS  Diff Comment 2 POIKILOCYTOSIS  Diff Comment 3 POLYCHROMASIA  Diff Comment 4 PLTS VARIED IN SIZE  Result(s) reported on 20 Feb 2013 at 04:49AM.   Radiology Results: CT:    24-Jun-14 17:19, CT Abdomen and Pelvis With Contrast  CT Abdomen and Pelvis With Contrast   REASON FOR EXAM:    (1) sepsis, colitis, abd pain f/u; (2) sepsis,   colitis, abd pain f/u  COMMENTS:  PROCEDURE: CT  - CT ABDOMEN / PELVIS  W  - Feb 20 2013  5:19PM     RESULT: Axial CT scanning was performed through the abdomen and pelvis   with reconstructions at 3 mm intervals and slice thicknesses. The patient   received 100 cc of Isovue-370 but was unable to tolerate more than a   small amount of oral contrast. Comparison is made to the noncontrast CT   study of February 14 2013.    Again demonstrated are small bilateral pleural effusions. There is   consolidation of portions of the right lower lobe posteriorly consistent   with pneumonia. This also is not new. The liver exhibits no focal mass or     ductal dilation. The gallbladder isadequately distended with no evidence   of stones. The stomach is relatively nondistended. The pancreas, adrenal   glands, and abdominal aorta are normal in appearance. The spleen appears   to be absent.    There is heterogeneous enhancement of portions of the mid and upper pole   of the left kidney which may reflect pyelonephritis. Mildly increased   density is present in the perinephric fat on the left. Neither kidney   exhibits evidence of stones nor of obstruction. There is no periaortic   nor pericaval  lymphadenopathy.    The small bowel loops do not appear abnormally distended. Contrast has   traversed the small bowel and reached the ascending and transverse   portions of the colon. There is no small or large bowel of obstruction.   No bowel definite wall thickening is demonstrated.  Within the pelvis a rectal balloon is present. The urinary bladder   contains a Foley catheter and small amount of intraluminal gas as well.   The uterus is situated to the left of midline. No suspicious adnexal   masses are demonstrated.    There is increased density within the subcutaneous fat bilaterally   suggesting anasarca. This has progressed somewhat since the previous   study. This is especially conspicuous along the right flank on images 55   through 102. Correlation with patient's clinical exam is needed. I cannot   exclude hemorrhage into these tissues.    IMPRESSION:   1. Since the previous study the appearance of the colon has improved. No   significant wall thickening is demonstrated. There is no evidence of   bowel obstruction or ileus.  2. The appearance of the left kidney is worrisome for pyelonephritis. A   Foley catheter is in place in the bladder and a small amount of gas is   present in the lumen which could be artifactually introduced or related   to infection.  3. There is no acute hepatobiliary abnormality.  4. Right basilar pneumonia remains present and small bilateral pleural   effusions remain. The finding is subcutaneous fat density likely reflects   anasarcano hemorrhage into the soft tissues cannot be absolutely   excluded.     Dictation Site: 1        Verified By: DAVID A. Martinique, M.D., MD   Assessment/Plan:  Assessment/Plan:  Assessment 1) colitis-none per ct today. no abd pain, tolerating po.   2) abnormal lfts-positive hepatitis b Sag.  hepatitis c serology pending.   Plan 1) no new GI recs, awaiting hcv serology.  no luminal evaluation planned.    Electronic Signatures: Loistine Simas (MD)  (Signed 24-Jun-14 20:33)  Authored: Chief Complaint, VITAL SIGNS/ANCILLARY NOTES, Brief Assessment, Lab Results, Radiology Results, Assessment/Plan  Last Updated: 24-Jun-14 20:33 by Loistine Simas (MD)

## 2014-12-20 NOTE — Consult Note (Signed)
General Aspect 69 year old lady with a hx of paranoid delusions, diabetes, hypertension, asthma, probable hepatitis, chronic paranoid schizophrenia and alcohol dependence in the past with multiple psychiatric hospitalizations, presenting to [facility] unresponsive, hypotensive, septic with possible PNA, UTO/pylonephritis, colitis.  initially intubated for airway support, started on broad ABX, pressors. No BP improved, but having sinus tachycardia rates up to 150 bpm at times. Started on diltiazem IV, changed to diltiazem po q6. Echo showed EF 50%, no significant valve disease.    CT of the head showed only atrophy with minimal white matter disease without any significant new strokes.   Physical Exam:  GEN well developed, well nourished, no acute distress   HEENT hearing intact to voice, moist oral mucosa   NECK supple   RESP normal resp effort  rhonchi   CARD Regular rate and rhythm  Tachycardic  No murmur   ABD denies tenderness  soft   LYMPH negative neck   EXTR negative edema   SKIN normal to palpation   NEURO motor/sensory function intact   PSYCH alert, poor insight   Review of Systems:  Subjective/Chief Complaint No complaints, eating liquid diet   General: No Complaints   Skin: No Complaints   ENT: No Complaints   Eyes: No Complaints   Neck: No Complaints   Respiratory: No Complaints   Cardiovascular: No Complaints   Gastrointestinal: No Complaints   Genitourinary: No Complaints   Vascular: No Complaints   Musculoskeletal: No Complaints   Neurologic: No Complaints   Hematologic: No Complaints   Endocrine: No Complaints   Psychiatric: No Complaints   Review of Systems: All other systems were reviewed and found to be negative   ROS sher ios a poor historian   Medications/Allergies Reviewed Medications/Allergies reviewed     Hepatitis C:    Hepatitis B:    Diabetes:    htn:    Schizophrenia:    Depression:    Asthma:    Hysterectomy  - Partial:        Admit Diagnosis:   AMS SHOCK: Onset Date: 20-Feb-2013, Status: Active, Description: AMS SHOCK  Home Medications: Medication Instructions Status  hydrochlorothiazide 25 mg oral tablet 1 tab(s) orally once a day Active  benazepril 10 mg oral tablet 1 tab(s) orally once a day Active  aspirin 81 mg oral tablet 1 tab(s) orally once a day Active  Vitamin B-100 Vitamin B Complex with C oral tablet, extended release 1 tab(s) orally once a day Active  amLODIPine 10 mg oral tablet 1 tab(s) orally once a day Active  Metoprolol Succinate ER 50 mg oral tablet, extended release 0.5 tab(s) orally 2 times a day Active  MiraLax - oral powder for reconstitution 17 gram(s) orally once a day Active  docusate sodium sodium 100 mg oral capsule 1 cap(s) orally 2 times a day Active  cloNIDine 0.1 mg oral tablet 1 tab(s) orally 2 times a day Active  clonazePAM 1 mg oral tablet 1 tab(s) orally 3 times a day Active  acetaminophen 500 mg oral tablet 1 tab(s) orally every 6 hours Active  amantadine 100 mg oral capsule 1 cap(s) orally once a day (at bedtime) Active  SEROquel XR 200 mg oral tablet, extended release 1 tab(s) orally once a day (in the evening) Active  Depakote ER 500 mg oral tablet, extended release 3 tab(s) orally once a day Active  Invega Sustenna 234 mg/1.5 mL intramuscular suspension, extended release 1.5 milliliter(s) intramuscular once a month Active   Lab Results: Routine Chem:  25-Jun-14 03:47   Magnesium, Serum  1.0 (1.8-2.4 THERAPEUTIC RANGE: 4-7 mg/dL TOXIC: > 10 mg/dL  -----------------------)  Phosphorus, Serum 2.7 (Result(s) reported on 21 Feb 2013 at 04:13AM.)  Glucose, Serum  135  BUN 12  Creatinine (comp) 0.60  Sodium, Serum 143  Potassium, Serum 4.1  Chloride, Serum  109  CO2, Serum 30  Calcium (Total), Serum  7.9  Anion Gap  4  Osmolality (calc) 287  eGFR (African American) >60  eGFR (Non-African American) >60 (eGFR values <43m/min/1.73 m2 may be an  indication of chronic kidney disease (CKD). Calculated eGFR is useful in patients with stable renal function. The eGFR calculation will not be reliable in acutely ill patients when serum creatinine is changing rapidly. It is not useful in  patients on dialysis. The eGFR calculation may not be applicable to patients at the low and high extremes of body sizes, pregnant women, and vegetarians.)   EKG:  Interpretation EKG shows sinus tachycardia, rate on arrival 105 bpm, nonspecific ST ABN  Tele strips showing sinus tach rates to 150 bpm at times   Radiology Results: XRay:    24-Jun-14 08:36, Chest Portable Single View  Chest Portable Single View   REASON FOR EXAM:    hypoxia  COMMENTS:       PROCEDURE: DXR - DXR PORTABLE CHEST SINGLE VIEW  - Feb 20 2013  8:36AM     RESULT: Comparison is made to the study of 02/17/2013. There is a   left-sided central venous catheter present via the jugular veinwith the   tip at the junction of the left brachiocephalic vein with the superior   vena cava. Bilateral lung base atelectasis or developing pneumonia is   present worse on the left than the right. There is hypoinflation. The   cardiac silhouette is normal. There is no interstitial edema or   significant effusion.    IMPRESSION:  Hypoinflation with bilateral lung base atelectasis versus   developing pneumonia. Interval removal of endotracheal tube and     nasogastric tube.    Dictation Site: 2        Verified By: GSundra Aland M.D., MD    PCN: Rash  Trileptal: Unknown  Depakote: Unknown  Haldol: Unknown  Vital Signs/Nurse's Notes: **Vital Signs.:   25-Jun-14 06:15  Vital Signs Type Routine  Pulse Pulse 94  Pulse source if not from Vital Sign Device per cardiac monitor  Respirations Respirations 16  Systolic BP Systolic BP 1259 Diastolic BP (mmHg) Diastolic BP (mmHg) 67  Mean BP 89  Pulse Ox % Pulse Ox % 100  Oxygen Delivery 2L; Nasal Cannula  Pulse Ox Heart Rate 92     Impression 69yo woman with  hx of paranoid delusions, diabetes, hypertension, asthma, probable hepatitis, chronic paranoid schizophrenia and alcohol dependence in the past with multiple psychiatric hospitalizations[facility] with possible PNA, UTO/pylonephritis, colitis.  Admitted for septic shock, intubated on pressors. Now extubated and off pressors. tachycardic into 150s yestreday s/p CT abdomen : clitis much better but proveios seen ateletasis worse vs pna, and now also more perinephric stranding / pyelo.  1) Sinus tach Likely secondary to underlying infection, unable to exclude etoh withdrawal? Would change diltiazem to metoprolol 25 mg q6 with hold parameters (may work better than diltiazem) Extra metoprolol IV as needed  2) Acute colitis, Infectious vs ischemic On meropenem  Repeat Ct abd showed improved colitis.  3)R basilar PNA: was present at admission ( atelectasis on  R base vs infection )  on broad ABX,  levaquin started  4) Septic and hypovolemic Shock:  severe multiorgan failure ( ams/aki/resp failure )- Resolved  5)  Altered mental status: . -metabolic encephalopathy sepsis/resolved -self extubated 6/22.   Electronic Signatures: Ida Rogue (MD)  (Signed 25-Jun-14 08:42)  Authored: General Aspect/Present Illness, History and Physical Exam, Review of System, Past Medical History, Health Issues, Home Medications, Labs, EKG , Radiology, Allergies, Vital Signs/Nurse's Notes, Impression/Plan   Last Updated: 25-Jun-14 08:42 by Ida Rogue (MD)

## 2014-12-20 NOTE — Discharge Summary (Signed)
PATIENT [facility]:  [name], [name] MR#:  [id number] DATE OF BIRTH:  06-Sep-1945  DATE OF ADMISSION:  02/13/2013 DATE OF DISCHARGE:  02/24/2013.   REASON FOR ADMISSION:  Altered mental status.   FINAL DIAGNOSES INCLUDE:  1.  Altered mental status, metabolic encephalopathy due to a combination of factors. (1)  Seizures. (2)  Sepsis.  2.  Acute colitis, infectious versus ischemic most likely infectious, treated with antibiotics.  3.  Sinus tachycardia due to infection.  4.  Urinary tract infection.  5.  Community-acquired pneumonia on admission or develop at least within the first 48 hours.  6.  Contamination with strep barient on blood culture, deemed as a contaminant.  7.  Pyelonephritis.  8.  Right basilar pneumonia.  9.  Septic and hypovolemic shock.  10.  Acute respiratory failure.  11.  Metabolic acidosis.  12.  Lactic acidosis.  13.  Type 2 diabetes.  14.  Severe hyperglycemia due to stress.  15.  Seizure disorder.  16.  Acute kidney injury, now resolved.  17.  Elevated liver function tests due to a history of hepatitis C and B.  18.  Rhabdomyolysis, now resolved.  19.  Severe hypomagnesemia.  20.  Hypokalemia.  21.  Anemia of chronic disease.  IMPORTANT RESULTS: 1.  Chest x-ray on admission, portable:  Endotracheal in place. Mild right basilar opacities likely secondary to atelectasis. This has progressed to an infiltrate within further x-rays. X-rays on June 21st showed shallow inspiration. Hypoinflation on June 24th with bilateral lung base atelectasis versus developing pneumonia.  2.  CT of the head without contrast:  Age-appropriate atrophy. This is at admission.  3.  CT abdomen:  Findings of colitis involving the descending colon and sigmoid colon, mild wall thickening of the ascending colon and hepatic flexure. Likely infectious versus inflammatory versus ischemic colitis as the differential diagnosis. There is mild decreased attenuation of the bladder reflected possible blood  products. At some point there was gas on the bladder as well. CT of the abdomen and pelvis repeated shows that by June 24th, there is previous aesthetic comparison show significant improvement with no significant wall thickening, no obstructions of ileus, left kidney worrisome for pyelonephritis. Foley catheter was in place and showed gas on the bladder, likely related to the infection. No acute hepatobiliary abnormalities. Right basilar pneumonia remains present. Small bilateral pleural effusions.  4.  Ultrasound Doppler of the lower extremity:  No sonographic evidence of thrombus.   OTHER IMPORTANT RESULTS:  The patient presented to the ER with acute respiratory failure. At that moment her lactic acid was 8.3, her pH was 7.08, her pCO2 was 53, her pO2 was 45 and her HCO2 was 17. She was severely acidotic. Vitamin B12 in the 600s. UA was negative for infection at admission. Her white count was only 4. Her hemoglobin was 13.7. Her ALT was 137, AST was 416. Her hemoglobin A1c was 6.7. Ammonia plasma was 242, but repeated level showed only 42. Potassium was 5.3 on admission, creatinine was 2.14. Her creatinine has normalized and right now it is 0.5. Her magnesium has been low around 1 to 1.3. Her white count at discharge 7.2. Her hemoglobin is 9.2.   HOSPITAL COURSE:  The patient is a very nice 69 year old female who was admitted on 02/13/2013 with a history of being at her assisted living, was found down slumped over a chair, unresponsive, intubated in the ER, with possible deviation of the eyes to the right side and possible seizure as per Neurology,  transferred to the CCU with blood pressures of the 70s/40s, put on pressors. This patient was very interesting in the sense of she was really sick and now she is recovering very nicely, which is quite refreshing. The patient came with significant hypotension, dehydrated, requiring three different pressors including Levophed, vasopressin and dopamine. The patient  actually was significantly acidotic, looking very bad, who was significantly ill without any major response for several days. She was not able to be awakened despite the lack of sedation. Then she started to waking up within the first two days and put on isolation. She was not moving her extremities voluntarily, but when once she woke up, she actually was alert and oriented x 3. She extubated herself without any complications. She started to become tachycardiac once the presses were gone for what another CT scan of the abdomen was done showing the colitis much better, but now atelectasis worse, likely pneumonia, with also pyelonephritis. The patient was at that moment added on Levaquin to double-cover for pneumonia and treat it appropriately.  The patient was seen by Cardiology due to the tachycardia and they recommended increase the dose of her beta blocker, The patient has been slowly progressing to the point of today being able to be discharged.   PROBLEMS:  1.  Colitis, now resolved, as per CT scan findings. She was on meropenem and Flagyl. The Flagyl was stopped. BloodCultures grew out Streptococcus barient, which was mostly a contamination. As far as her colitis, she has been able to feed herself. She has continued to have loose bowel movements for which we recommend this to be watched so that she does not get dehydrated again. The patient has on top of that pyelonephritis and UTI. Cultures were negative but findings were evident on the CT scan for what we continued treatment. 2.  As far as her right basilar pneumonia. The patient had this showing up in the ER and it was treated with meropenem. Levaquin was added on later on. At the beginning, it was thought that the process was mostly GI and that her pneumonia was atelectasis but during the course of the hospitalization, medications were added on to cover for atypicals and broad-spectrum.  3.  As far as her septic shock. The patient required up to three  pressors. She was completely acidotic with a lactic acid of 8.3. It was highly suspicious that the patient could have dead bowel syndrome, although the patient mostly was septic and dehydrated. Her scan did not show any signs of dead bowel, mostly just colitis.  4.  Respiratory failure. The patient is now on room air. She was self-extubated herself on 02/18/2013.  5.  As far as her diabetes, she has really good blood sugars now, but her blood sugars shoot up to 800 during the worst of her crisis, and they were corrected using insulin drip. Once she has been off the insulin drip, her blood sugars have been well controlled.  6.  Metabolic acidosis was present on admission, now resolved. The patient was on a bicarbonate drip. She developed acute kidney injury ATN due to the dehydration and sepsis and hypotension, which is now resolved. She also has rhabdomyolysis due to muscle breakdown from the sepsis and this also resolved.   TIME SPENT ON DISCHARGE:  60 minutes.   ____________________________ Felipa Furnace, MD rsg:jm D: 02/24/2013 10:13:06 ET T: 02/24/2013 10:54:50 ET JOB#: [id number]  cc: Felipa Furnace, MD, <Dictator> Patient's skilled nursing facility [facility] Juanda Chance MD ELECTRONICALLY  SIGNED 02/28/2013 15:08

## 2014-12-20 NOTE — Consult Note (Signed)
Chief Complaint:  Subjective/Chief Complaint seen for abnormal ct, colitis.  doing well s/p extubation.  denies n/v or abd pain.  on dysphagia I diet.   VITAL SIGNS/ANCILLARY NOTES: **Vital Signs.:   23-Jun-14 18:00  Vital Signs Type Routine  Temperature Source oral  Pulse Pulse 150  Respirations Respirations 42  Systolic BP Systolic BP 536  Diastolic BP (mmHg) Diastolic BP (mmHg) 58  Mean BP 84  Pulse Ox % Pulse Ox % 100  Oxygen Delivery 2L  Pulse Ox Heart Rate 152   Brief Assessment:  Cardiac Regular   Respiratory clear BS   Gastrointestinal details normal Soft  Nontender  Nondistended  No masses palpable  Bowel sounds normal   Lab Results: Hepatic:  23-Jun-14 05:32   Albumin, Serum  1.9 (Result(s) reported on 19 Feb 2013 at 02:24PM.)  Routine Chem:  23-Jun-14 05:32   Result Comment LABS - This specimen was collected through an   - indwelling catheter or arterial line.  - A minimum of 72ms of blood was wasted prior    - to collecting the sample.  Interpret  - results with caution.  Result(s) reported on 19 Feb 2013 at 05:57AM.  Glucose, Serum  152  BUN  23  Creatinine (comp) 0.66  Sodium, Serum  147  Potassium, Serum 4.2  Chloride, Serum  113  CO2, Serum 29  Calcium (Total), Serum  7.6  Anion Gap  5  Osmolality (calc) 299  eGFR (African American) >60  eGFR (Non-African American) >60 (eGFR values <673mmin/1.73 m2 may be an indication of chronic kidney disease (CKD). Calculated eGFR is useful in patients with stable renal function. The eGFR calculation will not be reliable in acutely ill patients when serum creatinine is changing rapidly. It is not useful in  patients on dialysis. The eGFR calculation may not be applicable to patients at the low and high extremes of body sizes, pregnant women, and vegetarians.)  Phosphorus, Serum 2.7 (Result(s) reported on 19 Feb 2013 at 05:48AM.)  Magnesium, Serum  1.7 (1.8-2.4 THERAPEUTIC RANGE: 4-7 mg/dL TOXIC: > 10  mg/dL  -----------------------)  Routine Hem:  23-Jun-14 05:32   WBC (CBC) 6.6  RBC (CBC)  2.65  Hemoglobin (CBC)  9.3  Hematocrit (CBC)  27.0  Platelet Count (CBC) 154  MCV  102  MCH  35.0  MCHC 34.3  RDW 14.3  Neutrophil % 43.8  Lymphocyte % 41.0  Monocyte % 13.8  Eosinophil % 1.1  Basophil % 0.3  Neutrophil # 2.9  Lymphocyte # 2.7  Monocyte # 0.9  Eosinophil # 0.1  Basophil # 0.0   Assessment/Plan:  Assessment/Plan:  Assessment 1) admission with sepsis, resp failure, diarrhea, shock-doign well/continues to improve.  2) abnormal ct and diarrhea on admission.  currently no abdominal pain, tolerating po, diarrhea resolved. .   Plan 1) recommend repeat ct in the next couple days with at least oral contrast for comparison with previous.  No plans for sedated luminal evaluation at present, but will need fu as outpatient.   Electronic Signatures: SkLoistine SimasMD)  (Signed 23-Jun-14 19:58)  Authored: Chief Complaint, VITAL SIGNS/ANCILLARY NOTES, Brief Assessment, Lab Results, Assessment/Plan   Last Updated: 23-Jun-14 19:58 by SkLoistine SimasMD)

## 2014-12-21 NOTE — Consult Note (Signed)
Referring Physician:  Vaughan Basta Vaughan Basta :   Reason for Consult: Admit Date: 20-Nov-2013  Chief Complaint: "I need to get out of here now"  Reason for Consult: phenytoin toxicity   History of Present Illness: History of Present Illness:   Ms. [name] is a 69 yo woman with PMH notable for epilepsy (on multiple medications, unknown etiology or semiology), paranoid schizophrenia, DM, HTN, and hepatitis who presented to [facility] after having a mechanical fall at the facility in which she lives. The patient is a poor historian and the information is gathered from the medical record.  was no concern for seizure or loss of consciousness. Initial evaluation in the ED showed a toxic total serum phenytoin level of 38.3, and she was admitted for further observation and workup. The Neurology service was consulted for assistance managing her medication.  ROS:  General denies complaints   HEENT no complaints   Lungs no complaints   Cardiac no complaints   GI no complaints   GU no complaints   Musculoskeletal no complaints   Extremities no complaints   Skin no complaints   Neuro no complaints   Endocrine no complaints   Psych no complaints   Past Medical/Surgical Hx:  Hepatitis C:   Hepatitis B:   Diabetes:   htn:   Schizophrenia:   Depression:   Asthma:   Hysterectomy - Partial:   Home Medications: Medication Instructions Last Modified Date/Time  calcium carbonate 500 mg (200 mg elemental calcium) oral tablet, chewable 1 tab(s) orally 2 times a day (8 am, 8 pm) 24-Mar-15 12:09  levETIRAcetam 1000 mg oral tablet 1 tab(s) orally 2 times a day (8 am, 8 pm) 24-Mar-15 12:09  metoprolol tartrate 50 mg oral tablet 1 tab(s) orally once a day (8 am) 24-Mar-15 12:09  omeprazole 20 mg oral delayed release capsule 1 cap(s) orally once a day 1 hour before a meal (7 am) 24-Mar-15 12:09  phenytoin 100 mg oral capsule, extended release 2 cap(s) orally 2 times a day  (8 am, 8 pm) 24-Mar-15 12:09  docusate sodium 100 mg oral capsule 1 cap(s) orally 2 times a day (8 am, 8 pm) 24-Mar-15 12:09  Vimpat 200 mg oral tablet 1 tab(s) orally 2 times a day (8 am, 8 pm) 24-Mar-15 12:09  Super B Complex Vitamin B Complex oral tablet 1 tab(s) orally once a day (8 am) 24-Mar-15 12:09  Vitamin B1 100 mg oral tablet 1 tab(s) orally once a day (8 am) 24-Mar-15 12:09  clonazePAM 0.5 mg oral tablet 1 tab(s) orally 2 times a day, As Needed for anxiety 24-Mar-15 12:09  Mapap 500 mg oral tablet 1 tab(s) orally every 6 hours, As Needed - for Pain 24-Mar-15 12:09  Aspirin Enteric Coated 81 mg oral delayed release tablet 1 tab(s) orally once a day (8 am) 24-Mar-15 12:09  fluPHENAZine decanoate 25 mg/mL injectable solution 1 milliliter(s) intramuscular every 2 weeks (14 days) 24-Mar-15 12:09  fluPHENAZine 5 mg oral tablet 1 tab(s) orally 2 times a day (8 am, 8 pm) 24-Mar-15 12:09  Jantoven 3 mg oral tablet 3 tab(s) (9 mg) orally once a day (2 pm) 24-Mar-15 12:09  lamoTRIgine 200 mg oral tablet 1 tab(s) orally 2 times a day (8 am, 8 pm) 24-Mar-15 12:09  magnesium oxide 400 mg oral tablet 1 tab(s) orally once a day (8 am) 24-Mar-15 12:09   Allergies:  PCN: Rash  Trileptal: Unknown  Depakote: Unknown  Haldol: Unknown  Social/Family History: Living Arrangements: residential facility  Social History: Smokes 1/2 ppd, not interested in quitting. Denies EtOH or illicit drug use.  Family History: Denies any FH of seizures.   Vital Signs: **Vital Signs.:   25-Mar-15 05:18  Vital Signs Type Routine  Temperature Temperature (F) 98.4  Celsius 36.8  Temperature Source oral  Pulse Pulse 98  Respirations Respirations 18  Systolic BP Systolic BP 595  Diastolic BP (mmHg) Diastolic BP (mmHg) 75  Mean BP 92  Pulse Ox % Pulse Ox % 92  Pulse Ox Activity Level  At rest  Oxygen Delivery Room Air/ 21 %   EXAM: Well-developed, well-nourished, in NAD. No conjunctival injection or scleral  edema. Oropharynx clear. No carotid bruits auscultated. Normal S1, S2 and regular cardiac rhythm on exam. Lungs clear to auscultation bilaterally. Abdomen soft and nontender. Peripheral pulses palpated. No clubbing, cyanosis, or edema in extremities.  MENTAL STATUS: Alert and oriented to person, place, and time. Language fluent and appropriate though thought process is tangential. Naming and repetition are intact. She is unable to follow complex commands and is unable to interpret simple proverbs. Left-right confusion is present. CRANIAL NERVES: Visual fields full to confrontation. PERRL. EOMI without nystagmus. Facial sensation intact. Facial muscles full and symmetric. Hearing intact to finger rub. Uvula midline with symmetric palatal elevation. Tongue midline without fasciculations. MOTOR: Normal bulk and tone. Strength 5/5 in deltoids, biceps, triceps, wrist flexors and extensors, and hand intrinsics bilaterally. Strength 5/5 in iliopsoas, glutes, hamstrings, quads, and tib ant bilaterally. REFLEXES: 2+ in biceps, triceps, patella, and achilles bilaterally. Flexor plantar responses bilaterally. SENSORY: Intact to vibration and pinprick throughout. COORDINATION: Mild dysmetria bilaterally on finger-nose. GAIT: Not tested.  Lab Results: Hepatic:  25-Mar-15 04:22   Bilirubin, Total 0.3  Alkaline Phosphatase  145 (45-117 NOTE: New Reference Range 07/20/13)  SGPT (ALT) 31  SGOT (AST) 21  Total Protein, Serum 7.1  Albumin, Serum  3.2  TDMs:  25-Mar-15 04:22   Dilantin, Serum  25.9 (Result(s) reported on 21 Nov 2013 at 05:47AM.)  Routine Micro:  24-Mar-15 11:43   Micro Text Report URINE CULTURE   COMMENT                   MIXED BACTERIAL ORGANISMS   COMMENT                   RESULTS SUGGESTIVE OF CONTAMINATION   ANTIBIOTIC                       Specimen Source CLEAN CATCH  Culture Comment MIXED BACTERIAL ORGANISMS  Culture Comment . RESULTS SUGGESTIVE OF CONTAMINATION  Result(s)  reported on 21 Nov 2013 at 09:24AM.  Routine Chem:  24-Mar-15 11:43   Result Comment DILANTIN - RESULTS VERIFIED BY REPEAT TESTING.  - CRITICAL RESULT CALLED TO AND READ BACK  - FROM BILL SMITH/1226;11-20-13/SFW.  Result(s) reported on 20 Nov 2013 at 12:29PM.  25-Mar-15 04:22   Glucose, Serum 97  BUN 14  Creatinine (comp)  0.52  Sodium, Serum 137  Potassium, Serum 3.9  Chloride, Serum 103  CO2, Serum 29  Calcium (Total), Serum 9.5  Osmolality (calc) 274  eGFR (African American) >60  eGFR (Non-African American) >60 (eGFR values <72m/min/1.73 m2 may be an indication of chronic kidney disease (CKD). Calculated eGFR is useful in patients with stable renal function. The eGFR calculation will not be reliable in acutely ill patients when serum creatinine is changing rapidly. It is not useful in  patients on dialysis. The eGFR  calculation may not be applicable to patients at the low and high extremes of body sizes, pregnant women, and vegetarians.)  Anion Gap  5  Routine UA:  24-Mar-15 11:43   Color (UA) Yellow  Clarity (UA) Clear  Glucose (UA) Negative  Bilirubin (UA) Negative  Ketones (UA) Negative  Specific Gravity (UA) 1.004  Blood (UA) Negative  pH (UA) 8.0  Protein (UA) Negative  Nitrite (UA) Negative  Leukocyte Esterase (UA) 1+ (Result(s) reported on 20 Nov 2013 at 12:01PM.)  RBC (UA) 2 /HPF  WBC (UA) 15 /HPF  Bacteria (UA) 1+  Epithelial Cells (UA) 1 /HPF (Result(s) reported on 20 Nov 2013 at 12:01PM.)  Routine Coag:  24-Mar-15 14:40   Prothrombin  14.8  INR 1.2 (INR reference interval applies to patients on anticoagulant therapy. A single INR therapeutic range for coumarins is not optimal for all indications; however, the suggested range for most indications is 2.0 - 3.0. Exceptions to the INR Reference Range may include: Prosthetic heart valves, acute myocardial infarction, prevention of myocardial infarction, and combinations of aspirin and anticoagulant. The  need for a higher or lower target INR must be assessed individually. Reference: The Pharmacology and Management of the Vitamin K  antagonists: the seventh ACCP Conference on Antithrombotic and Thrombolytic Therapy. BZJIR.6789 Sept:126 (3suppl): N9146842. A HCT value >55% may artifactually increase the PT.  In one study,  the increase was an average of 25%. Reference:  "Effect on Routine and Special Coagulation Testing Values of Citrate Anticoagulant Adjustment in Patients with High HCT Values." American Journal of Clinical Pathology 2006;126:400-405.)  Routine Hem:  25-Mar-15 04:22   WBC (CBC) 3.8  RBC (CBC) 4.08  Hemoglobin (CBC) 13.0  Hematocrit (CBC) 39.4  Platelet Count (CBC) 173  MCV 97  MCH 31.8  MCHC 32.9  RDW 14.4  Neutrophil % 29.7  Lymphocyte % 54.1  Monocyte % 13.3  Eosinophil % 2.1  Basophil % 0.8  Neutrophil #  1.1  Lymphocyte # 2.0  Monocyte # 0.5  Eosinophil # 0.1  Basophil # 0.0 (Result(s) reported on 21 Nov 2013 at 05:27AM.)   Radiology Results: CT:    24-Mar-15 11:00, CT Head Without Contrast  CT Head Without Contrast   REASON FOR EXAM:    pain following trauma  COMMENTS:       PROCEDURE: CT  - CT HEAD WITHOUT CONTRAST  - Nov 20 2013 11:00AM     CLINICAL DATA:  Fall    EXAM:  CT HEAD WITHOUT CONTRAST    TECHNIQUE:  Contiguous axial images were obtained from the baseof the skull  through the vertex without intravenous contrast.    COMPARISON:  02/14/2013  FINDINGS:  No skull fracture is noted. Mild scalp swelling left posterior  parietal region axial image 25.    No intracranial hemorrhage, mass effect or midline shift. Mild  cerebral atrophy. No acute cortical infarction. No mass lesion is  noted on this unenhanced scan. The gray and white-matter  differentiation is preserved.     IMPRESSION:  No acute intracranial abnormality. Mild focal scalp swelling in left  posterior parietal region.      Electronically Signed    By: Lahoma Crocker  M.D.    On: 11/20/2013 11:23         Verified By: Ephraim Hamburger, M.D.,   Impression/Recommendations: Recommendations:   Ms. Pernice is a 69 yo woman with a history of epilepsy managed on a complicated medication regimen who presented after a mechanical fall and was  found to have a toxic phenytoin level. Her neurologic exam now shows very mild bilateral ataxia, though no nystagmus that is typically associated with excessive phenytoin dosing. It is possible that the ataxia and balance difficulties caused by phenytoin toxicity may have contributed to her fall. am reluctant to permanently discontinue phenytoin given that she obviously has a very complicated antiepileptic regimen, which may suggest she has had difficult-to-control epilepsy in the past. I would, however, hold all phenytoin dosing until her serum levels are in the therapeutic range, i.e. 10-20. Then she should probably be resumed at a lower dose. Hold phenytoin until total serum phenytoin level is < 20. Resume dosing at 1/2 her previous dose - 145m extended release BID is reasonable. This could conceivably be done in the outpatient setting if she is able to arrange lab draws and a physician at her facility is able to follow up with this. Outpatient followup in next several weeks with the physician managing her seizure medications you for the opportunity to participate in Ms. CRinggold care. Please page neurology consults with further questions.  Electronic Signatures: HCarmin Richmond(MD)  (Signed 25-Mar-15 10:57)  Authored: REFERRING PHYSICIAN, Consult, History of Present Illness, Review of Systems, PAST MEDICAL/SURGICAL HISTORY, HOME MEDICATIONS, ALLERGIES, Social/Family History, NURSING VITAL SIGNS, Physical Exam-, LAB RESULTS, RADIOLOGY RESULTS, Recommendations   Last Updated: 25-Mar-15 10:57 by HCarmin Richmond(MD)

## 2014-12-21 NOTE — Consult Note (Signed)
Brief Consult Note: Diagnosis: schizophrenia undifferentiated.   Patient was seen by consultant.   Consult note dictated.   Comments: Psychiatry: Patient seen. Spoke with patient and a rep at [facility] and reviewed chart. Patient has chronic schizophrenia and receives ongoing outpt treatment. She is mentally at her baseline. She is not acutely dangerous. Does not require any change to current treatment. She does not understand the real nature of her condition and treatment and likely does not have capacity to make most decisions. She has a guardian, however, in [facility] [facility]. Patient is anxious to go and that seems to be the plan. If she is any trouble before then I'd suggest ativan 2mg  IM prn. Note dictated.  Electronic Signatures: Clapacs, Jackquline DenmarkJohn T (MD)  (Signed 25-Mar-15 13:18)  Authored: Brief Consult Note   Last Updated: 25-Mar-15 13:18 by Audery Amellapacs, John T (MD)

## 2014-12-21 NOTE — H&P (Signed)
PATIENT NAME:  [name], [name] MR#:  [id number] DATE OF BIRTH:  1946/05/07  DATE OF ADMISSION:  11/20/2013  PRIMARY CARE PHYSICIAN: At nursing home.   REFERRING ER PHYSICIAN:  Dr. Margarita Grizzle.   CHIEF COMPLAINT:  Fall.  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 She lives in a facility and today, morning, she says she was trying to get up from the bed and go to her wheelchair but she lost her balance and fell down. At that time, she also hit her head on the wall and so the nurse and other technicians over there came to her rescue, called EMS and sent her to the Emergency Room for further evaluation after this fall and hitting her head. On workup in the ER, a CT scan of the head, cervical spine and x-rays to look for any fractures, all of them came out to be negative but her Dilantin level came out very high and so she is being admitted for Dilantin toxicity. On further questioning to patient, she says that she was totally alert, did not pass out or did not have any abnormal movements. Denies any headache or weakness or numbness. She did not lose control on her bowel or bladder during this episode and she was knowing everything, what is going on, and she was telling the people around her about what she felt and how she fell down. She denied any palpitations, chest pain or shortness of breath associated with this episode either.   REVIEW OF SYSTEMS:    CONSTITUTIONAL: Negative for fever, fatigue, weakness, pain or weight loss.  EYES: No blurring, double vision, discharge or redness.  EARS, NOSE, THROAT: No tinnitus, ear pain or hearing loss.  RESPIRATORY: No cough, wheezing, hemoptysis or shortness of breath.  CARDIOVASCULAR: No chest pain. No palpitations, edema or arrhythmia.  GASTROINTESTINAL: No nausea, vomiting,  diarrhea, abdominal pain.  GENITOURINARY: The patient has chronically loss of control of her bladder but denies any recent worsening or burning in the urine.  ENDOCRINE: No heat or cold intolerance. No increased sweating.  SKIN: No rashes or itching.  NEUROLOGICAL: No numbness, weakness, tremor or vertigo following or before the episode.  PSYCHIATRIC: She has psychiatric issues but states that she is taking all the medication regularly.   PAST MEDICAL HISTORY: 1.  Type 2 diabetes.  2.  Hypertension.  3.  Peptic ulcer disease.  4.  Hepatitis.  5.  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   FAMILY HISTORY:  Not significant for cancer or cardiac disease.   SOCIAL HISTORY: She is a smoker, currently smokes almost half pack a day. Lives in a facility and uses wheelchair for her day-to-day activities but able to transfer herself from bed to wheelchair.   MEDICATIONS AT ADMISSION AND NURSING HOME:  1.  Aspirin 81 mg once a day.  2.  Calcium carbonate 500 mg 2 times a day.  3.  Clonazepam 0.5 mg oral tablet 2 times a day.  4.  Docusate sodium 100 mg 2 times a day.  5.  Fluphenazine 5 mg oral tablet 2 times a day.  6.  Fluphenazine 25 mg per mL injectable solution 1 mL intramuscular every 2 weeks.  7.  Jantoven 3 mg oral tablet take 3 tablets, that is 9 mg,  oral once a day.  8.  Lamotrigine 200 mg oral  tablet 1 tablet 2 times a day.  9.  Keppra 1000 mg oral tablet 2 times a day.  10.  Magnesium oxide 400 mg once a day.  11.  Mapap 500 mg oral tablet every 6 hours as needed for pain.  12.  Metoprolol 50 mg once a day.  13.  Omeprazole 20 mg delayed-release capsule once a day.  14.  Phenytoin 100 mg capsule extended-release 2 capsules 2 times a day.  15.  Vitamin B complex once a day.  16.  Vimpat 200 mg oral tablet 2 times a day.  17.  Vitamin B1 100 mg oral tablet once a day.   PHYSICAL EXAMINATION: VITAL SIGNS: In the ER, temperature 98.2, pulse 83, respirations 20, blood  pressure 172/82 and pulse ox 99% on room air.  GENERAL: The patient is alert and oriented to time, place and person but she is somewhat impulsive and not understanding the seriousness of the situation or the plan, looks like not very well then balanced psychiatrically.  HEENT: Head and neck atraumatic. Conjunctivae pink.  Oral mucosa moist.  NECK: Supple. No JVD.  RESPIRATORY: Bilateral clear and equal air entry.  CARDIOVASCULAR: S1, S2 present, regular. No murmur.  ABDOMEN: Soft, nontender. Bowel sounds present. No organomegaly.  SKIN: No rashes.  LEGS: No edema.  NEUROLOGICAL: Power 5 out of 5. Follows commands. Moves all 4 limbs. No tremors.  PSYCHIATRIC: As mentioned above, she is impulsive and is not very well appears to be understanding the plan and questioning the decisions about her treatment, even about the need for staying in the hospital.   LABORATORY, DIAGNOSTIC AND RADIOLOGICAL DATA:   1.  Glucose 88, BUN 15, creatinine 0.49, sodium 140, potassium 4.2, chloride 107, CO2 27, osmolality 280, calcium 9.4. 2.  Alkaline phosphate is 171, bilirubin total 0.2, SGOT 37, SGPT 42.  3.  Dilantin level 38.3.  4.  WBC 6.3, hemoglobin 13.9, platelet count 200.  5.  Urinalysis is positive with 15 WBC, 1+ leukocyte esterase.  6.  CT scan of the head and cervical spine is done to rule out any injuries which are negative for any fractures or bleeding.  7.  X-ray of left hip is also done as she had some complaint of pain over there but it is negative for any fracture.   ASSESSMENT AND PLAN: A 69 year old female who has multiple psychiatry and seizure history, who lives in a nursing home and is taking multiple seizure and psychiatric medication, had a fall due to loss of balance today and sent to Emergency Room, found having elevated Dilantin level.  1.  Dilantin toxicity or overdose. We would like to stop Dilantin for now. Currently, there are no signs of hepatic or renal failure or toxicity  secondary to that so we would like to monitor the Dilantin level and hepatic and renal function tomorrow. We will continue other medications to prevent seizures but call neurologist and we might need to readjust the dose of her seizure medication.  2.  Schizophrenia. Currently, the patient is not very realistic about treatment plan and she is questioning why she needs to stay in the hospital. She is also not very much sure whether the same group home will be able to take her back but she still says that. "I would like to go home, back, if I can go today," and questioning that why cannot the neurologist and other doctors can come and see her right now and discharge her as she is feeling totally  fine. I will call psychiatry consult for further management of her issue and decide the capacity in case if she plans to not comply with medical decisions and treatment and want to go though it is less likely because she lives in a facility.  3.  Anticoagulation at home. The patient is on Coumadin 9 mg daily. I could not find any reason why, she is not able to tell me but she tells me that she never had blood clots, she never had any major heart issues or valvular surgeries or irregular heartbeat. On my EKG, I have normal sinus rhythm and previous admission 7 months ago here does not mention anything about her Coumadin so I will check her PT/INR today but I would like to stop Coumadin till we find out why she was taking it.  4.  Diabetes and hypertension.  These are mentioned in her old records but she is not taking anything for diabetes. She is taking metoprolol for hypertension. We will continue the same. Blood pressure is stable. We will put her on insulin sliding scale coverage to check her fingerstick also if she has diabetes or not.  5.  Urinary tract infection.  She has urinary incontinence for almost a month but denies any burning or increased frequency. Her UA is positive borderline, so I would like to treat and  check  urine culture for that.  6.  Tobacco abuse. The patient is a smoker. Smoking cessation counseling is done for 4 minutes but she says that, "I would not stop smoking on saying of any man, woman or child, only if Jesus comes and tells me to stop, I will stop it." but she is ready use nicotine inhaler in the hospital and agrees not to smoke in the hospital. I spent 4 minutes explaining about smoking cessation.  7.  CODE STATUS: Full code.   TOTAL TIME SPENT ON THIS ADMISSION: 50 minutes.     ____________________________ Hope Pigeon Elisabeth Pigeon, MD vgv:cs D: 11/20/2013 14:06:00 ET T: 11/20/2013 14:35:11 ET JOB#: [id number]  cc: Hope Pigeon. Elisabeth Pigeon, MD, <Dictator> Altamese Dilling MD ELECTRONICALLY SIGNED 11/26/2013 22:01

## 2014-12-21 NOTE — Discharge Summary (Signed)
PATIENT [facility]:  [name], [name] MR#:  [id number] DATE OF BIRTH:  12/19/1945  DATE OF ADMISSION:  11/20/2013 DATE OF DISCHARGE:  11/21/2013  ADMITTING DIAGNOSES:   1. Dilantin toxicity. 2. Schizophrenia.   DISCHARGE DIAGNOSES: 1.  Dilantin toxicity. 2.  Generalized weakness. 3.  Bilateral ataxia.  4.  Pyuria.  No urinary tract infection.  5.  Tobacco abuse.  6.  Mild dehydration.  7.  History of diabetes mellitus.  8.  Hypertension.  9.  Peptic ulcer disease.  10.  Hepatitis.  11.  Chronic paranoid schizophrenia.  12.  Seizure disorder.   DISCHARGE CONDITION:  Stable.   DISCHARGE MEDICATIONS:  The patient is to resume:  1.  Calcium carbonate 500 mg p.o. twice daily.  2.  Keppra 1000 mg twice daily.  3.  Metoprolol tartrate 50 mg p.o. daily.  4.  Omeprazole 20 mg p.o. daily.  5.  Docusate sodium 100 mg twice daily.  6.  Vimpat 200 mg p.o. twice daily.  7.  Super B vitamins once daily.  8.  Vitamin B 100 mg daily.  9.  Clonazepam 0.5 mg twice daily as needed.  10.  Tylenol 500 mg p.o. every 6 hours as needed.  11.  Aspirin 81 mg p.o. daily.  12.  Fluphenazine 25 mg in 1 mL injectable solution, 1 mL intramuscular every 2 weeks.  13.  Fluphenazine 5 mg twice daily.  14.  Jantoven which is Coumadin 3 mg p.o. 3 tablets, which would be 9 mg once a day.  15.  Lamotrigine 200 mg p.o. twice daily.  16.  Magnesium oxide 400 mg once daily.  17.  Phenytoin 100 mg p.o. twice daily.  18.  Nicotine oral inhaler 1 inhalation every 1 to 2 hours as needed.  19.  Nicotine transdermal patch once daily.   Home health physical therapy as well as nurse.  The patient's nurse will be advised to get Dilantin level and repeat it in the next 2 days and send the results to Dr. Judithann SheenSparks to adjust medications if needed.   DIET:  A 2 gram salt, low fat, cholesterol, carbohydrate-controlled diet, mechanical soft.   ACTIVITY LIMITATIONS:  As tolerated.   REFERRALS:  Home health physical therapy as well as  R.N.    FOLLOWUP APPOINTMENT:  With Dr. Judithann SheenSparks in 2 days after discharge.    CONSULTANTS:  Care management, social work, Dr. Toni Amendlapacs.   RADIOLOGIC STUDIES:  Left knee AP and lateral on 11/20/2013 was negative.  Left hip complete x-ray on 11/20/2013 revealed no acute osseous injury of left hip, severe left hip osteoarthritis noted.  Given the patient's age and osteopenia, if there is a persistent clinical concern for a hip fracture, an MRI of the hip would be recommended for increased sensitivity.  Thoracic spine AP and lateral on 11/20/2013 was negative.  Lumbar spine AP and lateral on 11/20/2013 showed no acute osseous injury of the lumbar spine, degenerative disk disease of L2-3 and L3-4.  Left hip osteoarthritis was also noted.  CT scan of the head without contrast on 11/20/2013 revealed no acute intracranial abnormality, mild focal scalp swelling in the left posterior parietal region was noted.  CT of cervical spine without contrast on 11/20/2013 showed no evidence of acute cervical spine fracture or dislocation.  There is degenerative disk disease in the mid cervical spine.  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  Thyroid ultrasound would be useful when the patient has recovered from  the acute injury.   HOSPITAL COURSE:  [name] is a 69 year old African American female with past medical history significant for history of psychiatric disease who presents to the hospital with complaints of a fall.  Apparently, the patient was trying to get out from bed to the wheelchair, but lost her  balance and fell down and hit her head on the wall, so EMS was called and the patient was sent to the Emergency Room for further evaluation.  In the Emergency Room, she had her labs checked and she was found to have elevated Dilantin level and the hospitalist services were contacted for admission.  Vital signs on the day of admission: Temperature was 98.2, pulse was  83, respiratory rate was 20, blood pressure 172/82, saturation was 99% on room air.  Physical exam was unremarkable.  The patient was able to move upper and lower extremities and follows commands.  Her lab data done in the Emergency Room revealed normal BMP, slightly elevated alkaline phosphatase 171, otherwise liver enzymes were also normal.  Dilantin level was high at 38.3.  CBC was within normal limits.  The patient's pro-time was 14.8 and INR was 1.2.  Urinalysis revealed 15 white blood cells, 1+ leukocyte esterase; however, urine cultures showed mixed bacterial organisms, results suggestive of contamination.  EKG showed normal sinus rhythm at 76 beats per minute.  Normal axis.  No acute ST-T changes were noted.  The patient was admitted to the hospital for further evaluation.  Her Dilantin was stopped and Dilantin level was rechecked on 11/21/2013 and it was much better at 25.9.  The patient's clinical exam was truly unremarkable.  She did not exhibit any signs of overdose or toxicity.  The patient was consulted by Dr. Toni Amend who felt that patient has chronic schizophrenia and receives ongoing outpatient treatment.  She is mentally at her baseline and she is not acutely dangerous.  She does not require any change of current treatment.  She however, does not understand the real nature of her condition and treatment and likely does not have capacity to make most decisions.  Her guardian was [facility].  The patient was anxious to go and Dr. Toni Amend felt that it was the plan to discharge her; however, if medical staff would find that she is in trouble or she would have any problems, then Dr. Toni Amend recommended Ativan 2 mg IM as needed.  The patient was about to be given IV fluids and her labs were recommended to be rechecked just to make sure that the level is below 20; however, the patient refused to have her labs checked as well as to be given any IV fluids, so we felt that the patient's Dilantin  should be placed on hold and started only tomorrow, 11/22/2013.  Her level should be rechecked again in the next 2 days to make sure that it is actually therapeutic.  The patient was also consulted by neurologist, Dr. Jeryl Columbia the same day, 11/21/2013.  Dr. Jeryl Columbia felt that the patient's phenytoin should be held until phenytoin level is below 20 and resume dosing and only half of previous dose which would be at 100 mg extended release twice daily and he felt that that is a reasonable dose.  This could conceivably be done as outpatient setting if lab draws could be arranged and we felt that patient's lab draws could be arranged every few days and make sure that her Dilantin level remains stable and does not decline significantly.  Dr. Jeryl Columbia  also recommended outpatient follow-up in the next several weeks with the physician who manages her seizure medications, but did not feel that the patient needs to be staying in the hospital for that reason.  On the day of discharge, the patient was satisfactory.  As mentioned above, her vitals were stable with temperature of 98.5, pulse was 88, respiratory rate was 18, blood pressure 105/64, saturation was 96% on room air at rest.   TIME SPENT:  40 minutes.    ____________________________ Katharina Caper, MD rv:ea D: 11/21/2013 18:17:10 ET T: 11/22/2013 02:05:44 ET JOB#: [id number]  cc: Katharina Caper, MD, <Dictator> Duane Lope. Judithann Sheen, MD   Katharina Caper MD ELECTRONICALLY SIGNED 12/04/2013 21:11

## 2014-12-21 NOTE — Consult Note (Signed)
PATIENT NAME:  [name], [name] MR#:  [id number] DATE OF BIRTH:  01/23/1946  DATE OF CONSULTATION:  11/21/2013  CONSULTING PHYSICIAN:  Audery AmelJohn T. Ruwayda Curet, MD  IDENTIFYING INFORMATION AND REASON FOR CONSULTATION: A 69 year old woman with a history of schizophrenia admitted in the hospital after suffering a fall at [facility]. Found to have an elevated Dilantin level. Consult for patient's ability to understand treatment.   HISTORY OF PRESENT ILLNESS: Information obtained from the patient and the chart. Also spoke to a representative at [facility]. The patient came into the hospital after having a fall that occurred evidently during transfer from bed to wheelchair. Appears not to have lost consciousness. Does not seem to have suffered any brain or orthopedic injury. On workup Dilantin level was found to be very high and she was admitted to the hospital for stabilization. It was noted by the admitting physician that the patient appeared to be confused and had trouble understanding the treatment plan.  On my assessment today, the patient tells me that she has chronic weakness and pain in her legs. She says that she wants to use a wheelchair all the time. She claims that she normally can transfer from the wheelchair to the bed and vice versa but that recently she has been feeling a little bit more weak. She admits that she had been feeling a little bit more confused in her head recently, but has a hard time describing it. Does not report depression. Does not report suicidal or homicidal ideation. She is evasive about reporting hallucinations but seems to suggest that she does still have auditory hallucinations. She denies suicidal or homicidal ideation. The patient tells me that she just wants to be discharged from the hospital. When I explained to her that the doctors wanted to make sure they had done everything properly and safely, she gets irritable and tells me that she  knows for a fact that everything has been done and that there is no need for her to be here and she wants to be discharged. Despite her adamancy about this, however, she did not make any actual effort to get up out of bed and was not physically disruptive.   PAST PSYCHIATRIC HISTORY: The patient has a long established diagnosis of schizophrenia. She was last treated at our hospital in 2007. At that time, she was very psychotic, agitated, hostile, and took some time to respond to medication. According to the patient, she has not had any other psychiatric hospitalizations since then, but I am not sure how reliably a historian she is. She is treated with Prolixin Decanoate 25 mg every couple of weeks and also gets Prolixin 5 mg twice a day and clonazepam as needed. The patient has been seen by Dr. Omelia [place]Headen in [place]. She has only been living at her current living facility for the last couple months. They are in the process of trying to transfer her to a new provider and it sounds like that still has not been fully established. The patient is not known to have a history of suicidal behavior, and she does not endorse any. She does have a history of lashing out and being aggressive, but that has been quite a while in the past.   SUBSTANCE ABUSE HISTORY: The patient denies any substance abuse currently. It is not known whether there was any problem in the past, but given her living situation that does not seem to be an issue now.   SOCIAL HISTORY: She  lives at [facility] facility. She has just been there for the last couple of months. The patient evidently is legally incompetent and I am told that she has a guardian in the form of the [name] of [facility]. The patient says that she has 2 adult children, but claims that they never come to visit her and that she has been told they are not allowed to visit her. I did follow up on that. It sounds like that could be psychotic.  I am not sure about the details.   PAST MEDICAL HISTORY: The patient has a seizure disorder. Additionally, she has a history of type 2 diabetes, peptic ulcer disease, hypertension, asthma, some kind of hepatitis. The patient tells me that she has cancer, but her description of it is so vague I suspect that may also be psychotic.   REVIEW OF SYSTEMS:  Denies depression. Denies suicidal or homicidal ideation. Says that she sleeps reasonably well at night. Denies any current problems with her energy. She denies any acute pain. Not feeling dizzy right now. The rest of the full review of systems is negative except that she is vaguely endorsing possible auditory hallucinations.   MENTAL STATUS EXAMINATION: Disheveled woman looks older than her stated age, cooperative with the interview. Eye contact good. Psychomotor activity slow and impaired. Speech decreased in total amount but understandable. Affect blunted, slightly irritable, but not hostile. Mood stated as fine. Thoughts are disorganized and scattered. Several things that she says could be delusional. Endorses possible auditory and possible visual hallucinations. Judgment and insight poor. She does know where she is, is oriented to place, also oriented to date and month. She could remember 2 out of 3 objects at 3 minutes. Longer term memory seems to probably be within the normal range. Fund of knowledge appears to be adequate for general issues, although again she does not have a really good understanding of her medical condition.   MEDICATIONS: Aspirin 81 mg a day, calcium 500 mg twice a day, clonazepam 0.5 mg twice a day, docusate 100 mg twice a day, Prolixin 5 mg 2 times a day, Prolixin Decanoate 25 mg IM every 2 weeks, Jantoven 9 mg once a day, lamotrigine 200 mg twice a day, Keppra 1000 mg twice a day, magnesium 400 mg once a day, Mapap 500 mg every 6 hours as needed for pain, metoprolol (Dictation Anomaly) <<25MISSING TEXT>> mg once a day, omeprazole  20 mg per day, Dilantin 100 mg 2 capsules 2 times a day, vitamin B complex once a day, Vimpat 200 mg twice a day, vitamin B1 twice a day. I believe they stopped the Dilantin.   ALLERGIES: DEPAKOTE, HALDOL, PENICILLIN, TRILEPTAL.   ASSESSMENT:  A 69 year old woman with schizophrenia. Currently has psychiatric symptoms of some degree of confusion, probably delusional symptoms, difficulty cognitively processing and understanding things, some degree of irritability. Based on my interview with her today, I would agree that she lacks the capacity to make most decisions. She was not able to process a discussion about her medical needs and was insistent that she understood them better than her doctors without being able to present any information to support that. Fortunately, the patient has been declared incompetent and has a guardian in the form of Department of Social Services. She is demanding to leave the hospital but has not been physically disruptive at this point. Hopefully, typical of a person with schizophrenia, she might say that she is insistent on something but be fairly  passive in acting on it. At this point, she is not acutely dangerous to herself. Does not need any acute change in her psychiatric treatment. She does need, however, followup treatment so that she can stay on the Prolixin Decanoate.   RECOMMENDATION FOR TREATMENT: No change to her medical or psychiatric treatment. The patient encouraged to try and be a little bit patient here. If she starts to be disruptive or problematic, I would not hesitate to give her 2 mg Ativan IM every 2 hours as needed for agitation. It is my understanding that she is probably being discharged today anyway.   DIAGNOSIS, PRINCIPAL AND PRIMARY: AXIS I:  Schizophrenia, undifferentiated.   SECONDARY DIAGNOSES: AXIS I:   Delirium from Dilantin overdose, resolving.  AXIS II:  Deferred.  AXIS III: Multiple medical problems including Dilantin overdose, seizures,  high blood pressure, diabetes, gastric reflux disease.  AXIS IV: Severe from multiple illnesses, poor functioning, lack of social support.  AXIS V:  Functioning at time of evaluation is 40.   ____________________________ Audery Amel, MD jtc:ce D: 11/21/2013 13:27:49 ET T: 11/21/2013 15:16:02 ET JOB#: [id number]  cc: Audery Amel, MD, <Dictator> Audery Amel MD ELECTRONICALLY SIGNED 11/21/2013 15:56

## 2015-12-02 ENCOUNTER — Other Ambulatory Visit: Payer: Self-pay | Admitting: Internal Medicine

## 2015-12-02 DIAGNOSIS — S0990XA Unspecified injury of head, initial encounter: Secondary | ICD-10-CM

## 2015-12-11 ENCOUNTER — Ambulatory Visit
Admission: RE | Admit: 2015-12-11 | Discharge: 2015-12-11 | Disposition: A | Discharge status: Home / Self Care | Payer: Medicare Other | Source: Ambulatory Visit | Attending: Internal Medicine | Admitting: Internal Medicine

## 2015-12-11 DIAGNOSIS — W1830XA Fall on same level, unspecified, initial encounter: Secondary | ICD-10-CM | POA: Insufficient documentation

## 2015-12-11 DIAGNOSIS — I739 Peripheral vascular disease, unspecified: Secondary | ICD-10-CM | POA: Insufficient documentation

## 2015-12-11 DIAGNOSIS — S0990XA Unspecified injury of head, initial encounter: Secondary | ICD-10-CM

## 2015-12-11 DIAGNOSIS — S0510XA Contusion of eyeball and orbital tissues, unspecified eye, initial encounter: Secondary | ICD-10-CM | POA: Diagnosis present

## 2016-11-02 ENCOUNTER — Observation Stay: Payer: Medicare Other

## 2016-11-02 ENCOUNTER — Emergency Department: Payer: Medicare Other

## 2016-11-02 ENCOUNTER — Encounter: Payer: Self-pay | Admitting: Emergency Medicine

## 2016-11-02 ENCOUNTER — Observation Stay
Admission: EM | Admit: 2016-11-02 | Discharge: 2016-11-03 | Disposition: A | Discharge status: Home / Self Care | Payer: Medicare Other | Attending: Internal Medicine | Admitting: Internal Medicine

## 2016-11-02 DIAGNOSIS — I10 Essential (primary) hypertension: Secondary | ICD-10-CM | POA: Insufficient documentation

## 2016-11-02 DIAGNOSIS — F209 Schizophrenia, unspecified: Secondary | ICD-10-CM | POA: Insufficient documentation

## 2016-11-02 DIAGNOSIS — R569 Unspecified convulsions: Secondary | ICD-10-CM | POA: Diagnosis not present

## 2016-11-02 DIAGNOSIS — E785 Hyperlipidemia, unspecified: Secondary | ICD-10-CM

## 2016-11-02 DIAGNOSIS — Z8249 Family history of ischemic heart disease and other diseases of the circulatory system: Secondary | ICD-10-CM | POA: Diagnosis not present

## 2016-11-02 DIAGNOSIS — S0003XA Contusion of scalp, initial encounter: Secondary | ICD-10-CM | POA: Diagnosis not present

## 2016-11-02 DIAGNOSIS — R29898 Other symptoms and signs involving the musculoskeletal system: Secondary | ICD-10-CM

## 2016-11-02 DIAGNOSIS — Z794 Long term (current) use of insulin: Secondary | ICD-10-CM | POA: Diagnosis not present

## 2016-11-02 DIAGNOSIS — Z7901 Long term (current) use of anticoagulants: Secondary | ICD-10-CM | POA: Diagnosis not present

## 2016-11-02 DIAGNOSIS — G459 Transient cerebral ischemic attack, unspecified: Secondary | ICD-10-CM | POA: Diagnosis present

## 2016-11-02 DIAGNOSIS — E119 Type 2 diabetes mellitus without complications: Secondary | ICD-10-CM | POA: Diagnosis not present

## 2016-11-02 DIAGNOSIS — G40909 Epilepsy, unspecified, not intractable, without status epilepticus: Secondary | ICD-10-CM | POA: Diagnosis not present

## 2016-11-02 DIAGNOSIS — F17210 Nicotine dependence, cigarettes, uncomplicated: Secondary | ICD-10-CM | POA: Diagnosis not present

## 2016-11-02 DIAGNOSIS — Z79899 Other long term (current) drug therapy: Secondary | ICD-10-CM | POA: Diagnosis not present

## 2016-11-02 DIAGNOSIS — I639 Cerebral infarction, unspecified: Secondary | ICD-10-CM

## 2016-11-02 DIAGNOSIS — R531 Weakness: Secondary | ICD-10-CM | POA: Diagnosis present

## 2016-11-02 DIAGNOSIS — Z22322 Carrier or suspected carrier of Methicillin resistant Staphylococcus aureus: Secondary | ICD-10-CM

## 2016-11-02 DIAGNOSIS — T148XXA Other injury of unspecified body region, initial encounter: Secondary | ICD-10-CM

## 2016-11-02 DIAGNOSIS — W050XXA Fall from non-moving wheelchair, initial encounter: Secondary | ICD-10-CM | POA: Insufficient documentation

## 2016-11-02 DIAGNOSIS — F0390 Unspecified dementia without behavioral disturbance: Secondary | ICD-10-CM | POA: Diagnosis not present

## 2016-11-02 DIAGNOSIS — Z7982 Long term (current) use of aspirin: Secondary | ICD-10-CM | POA: Insufficient documentation

## 2016-11-02 DIAGNOSIS — R51 Headache: Secondary | ICD-10-CM | POA: Insufficient documentation

## 2016-11-02 DIAGNOSIS — W19XXXA Unspecified fall, initial encounter: Secondary | ICD-10-CM

## 2016-11-02 LAB — CBC WITH DIFFERENTIAL/PLATELET
BAND NEUTROPHILS: 1 %
BASOS PCT: 1 %
Basophils Absolute: 0.1 10*3/uL (ref 0–0.1)
Blasts: 0 %
EOS ABS: 0 10*3/uL (ref 0–0.7)
EOS PCT: 0 %
HCT: 40.1 % (ref 35.0–47.0)
Hemoglobin: 13.5 g/dL (ref 12.0–16.0)
LYMPHS ABS: 4.9 10*3/uL — AB (ref 1.0–3.6)
Lymphocytes Relative: 35 %
MCH: 32.9 pg (ref 26.0–34.0)
MCHC: 33.5 g/dL (ref 32.0–36.0)
MCV: 98.1 fL (ref 80.0–100.0)
MONO ABS: 1.3 10*3/uL — AB (ref 0.2–0.9)
MYELOCYTES: 0 %
Metamyelocytes Relative: 0 %
Monocytes Relative: 9 %
NEUTROS PCT: 54 %
NRBC: 0 /100{WBCs}
Neutro Abs: 7.7 10*3/uL — ABNORMAL HIGH (ref 1.4–6.5)
OTHER: 0 %
PLATELETS: 223 10*3/uL (ref 150–440)
PROMYELOCYTES ABS: 0 %
RBC: 4.09 MIL/uL (ref 3.80–5.20)
RDW: 14.2 % (ref 11.5–14.5)
WBC: 14 10*3/uL — ABNORMAL HIGH (ref 3.6–11.0)

## 2016-11-02 LAB — COMPREHENSIVE METABOLIC PANEL
ALBUMIN: 3.9 g/dL (ref 3.5–5.0)
ALT: 19 U/L (ref 14–54)
ANION GAP: 8 (ref 5–15)
AST: 27 U/L (ref 15–41)
Alkaline Phosphatase: 95 U/L (ref 38–126)
BUN: 12 mg/dL (ref 6–20)
CHLORIDE: 107 mmol/L (ref 101–111)
CO2: 24 mmol/L (ref 22–32)
Calcium: 9.4 mg/dL (ref 8.9–10.3)
Creatinine, Ser: 0.4 mg/dL — ABNORMAL LOW (ref 0.44–1.00)
GFR calc Af Amer: >60 mL/min (ref >60)
GFR calc non Af Amer: >60 mL/min (ref >60)
GLUCOSE: 127 mg/dL — AB (ref 65–99)
Potassium: 4.3 mmol/L (ref 3.5–5.1)
SODIUM: 139 mmol/L (ref 135–145)
Total Bilirubin: 0.6 mg/dL (ref 0.3–1.2)
Total Protein: 7 g/dL (ref 6.5–8.1)

## 2016-11-02 LAB — PROTIME-INR
INR: 1.03
Prothrombin Time: 13.5 seconds (ref 11.4–15.2)

## 2016-11-02 LAB — GLUCOSE, CAPILLARY: GLUCOSE-CAPILLARY: 113 mg/dL — AB (ref 65–99)

## 2016-11-02 LAB — TROPONIN I: Troponin I: <0.03 ng/mL (ref <0.03)

## 2016-11-02 MED ORDER — LAMOTRIGINE 100 MG PO TABS
200.0000 mg | ORAL_TABLET | Freq: Two times a day (BID) | ORAL | Status: DC
  Administered 2016-11-02 – 2016-11-03 (×2): 200 mg via ORAL
  Filled 2016-11-02 (×2): qty 2
  Filled 2016-11-02: qty 8

## 2016-11-02 MED ORDER — METOPROLOL TARTRATE 25 MG PO TABS
25.0000 mg | ORAL_TABLET | Freq: Two times a day (BID) | ORAL | Status: DC
  Administered 2016-11-02 – 2016-11-03 (×2): 25 mg via ORAL
  Filled 2016-11-02 (×2): qty 1

## 2016-11-02 MED ORDER — ONDANSETRON HCL 4 MG/2ML IJ SOLN: 4.0000 mg | Freq: Four times a day (QID) | INTRAMUSCULAR | Status: DC | PRN

## 2016-11-02 MED ORDER — SODIUM CHLORIDE 0.9% FLUSH
3.0000 mL | Freq: Two times a day (BID) | INTRAVENOUS | Status: DC
  Administered 2016-11-03: 3 mL via INTRAVENOUS

## 2016-11-02 MED ORDER — MUPIROCIN 2 % EX OINT: TOPICAL_OINTMENT | CUTANEOUS | 0 refills | Status: AC

## 2016-11-02 MED ORDER — ENOXAPARIN SODIUM 40 MG/0.4ML ~~LOC~~ SOLN: 40.0000 mg | SUBCUTANEOUS | Status: DC

## 2016-11-02 MED ORDER — CLONAZEPAM 1 MG PO TABS
1.0000 mg | ORAL_TABLET | Freq: Two times a day (BID) | ORAL | Status: DC | PRN
  Administered 2016-11-03: 13:00:00 1 mg via ORAL
  Filled 2016-11-02 (×2): qty 1

## 2016-11-02 MED ORDER — SODIUM CHLORIDE 0.9 % IV SOLN: 250.0000 mL | INTRAVENOUS | Status: DC | PRN

## 2016-11-02 MED ORDER — PHENYTOIN SODIUM EXTENDED 100 MG PO CAPS
100.0000 mg | ORAL_CAPSULE | Freq: Two times a day (BID) | ORAL | Status: DC
  Administered 2016-11-02 – 2016-11-03 (×2): 100 mg via ORAL
  Filled 2016-11-02 (×2): qty 1

## 2016-11-02 MED ORDER — WARFARIN SODIUM 10 MG PO TABS
10.0000 mg | ORAL_TABLET | Freq: Every day | ORAL | Status: DC
  Filled 2016-11-02: qty 1

## 2016-11-02 MED ORDER — RIVAROXABAN 20 MG PO TABS
20.0000 mg | ORAL_TABLET | Freq: Every day | ORAL | Status: DC
  Administered 2016-11-03: 09:00:00 20 mg via ORAL
  Filled 2016-11-02: qty 1

## 2016-11-02 MED ORDER — ACETAMINOPHEN 650 MG RE SUPP: 650.0000 mg | Freq: Four times a day (QID) | RECTAL | Status: DC | PRN

## 2016-11-02 MED ORDER — VITAMIN B-1 100 MG PO TABS
100.0000 mg | ORAL_TABLET | Freq: Every day | ORAL | Status: DC
Start: 2016-11-02 — End: 2016-11-03
  Administered 2016-11-02 – 2016-11-03 (×2): 100 mg via ORAL
  Filled 2016-11-02 (×2): qty 1

## 2016-11-02 MED ORDER — MAGNESIUM OXIDE 400 (241.3 MG) MG PO TABS
400.0000 mg | ORAL_TABLET | Freq: Every day | ORAL | Status: DC
  Administered 2016-11-02 – 2016-11-03 (×2): 400 mg via ORAL
  Filled 2016-11-02 (×4): qty 1

## 2016-11-02 MED ORDER — ACETAMINOPHEN 325 MG PO TABS
650.0000 mg | ORAL_TABLET | Freq: Four times a day (QID) | ORAL | Status: DC | PRN
  Administered 2016-11-02 – 2016-11-03 (×2): 650 mg via ORAL
  Filled 2016-11-02 (×2): qty 2

## 2016-11-02 MED ORDER — ALBUTEROL SULFATE (2.5 MG/3ML) 0.083% IN NEBU: 2.5000 mg | INHALATION_SOLUTION | RESPIRATORY_TRACT | Status: DC | PRN

## 2016-11-02 MED ORDER — INSULIN ASPART 100 UNIT/ML ~~LOC~~ SOLN
0.0000 [IU] | Freq: Three times a day (TID) | SUBCUTANEOUS | Status: DC
  Administered 2016-11-03: 09:00:00 1 [IU] via SUBCUTANEOUS
  Filled 2016-11-02 (×2): qty 1

## 2016-11-02 MED ORDER — LACOSAMIDE 200 MG PO TABS
200.0000 mg | ORAL_TABLET | Freq: Two times a day (BID) | ORAL | Status: DC
  Administered 2016-11-02 – 2016-11-03 (×2): 200 mg via ORAL
  Filled 2016-11-02 (×2): qty 1

## 2016-11-02 MED ORDER — LEVETIRACETAM 500 MG PO TABS
1000.0000 mg | ORAL_TABLET | Freq: Two times a day (BID) | ORAL | Status: DC
  Administered 2016-11-02 – 2016-11-03 (×2): 1000 mg via ORAL
  Filled 2016-11-02 (×3): qty 2

## 2016-11-02 MED ORDER — SODIUM CHLORIDE 0.9 % IV SOLN
1000.0000 mL | Freq: Once | INTRAVENOUS | Status: AC
  Administered 2016-11-02: 1000 mL via INTRAVENOUS

## 2016-11-02 MED ORDER — ASPIRIN EC 81 MG PO TBEC
81.0000 mg | DELAYED_RELEASE_TABLET | Freq: Every day | ORAL | Status: DC
  Administered 2016-11-03: 81 mg via ORAL
  Filled 2016-11-02: qty 1

## 2016-11-02 MED ORDER — DOCUSATE SODIUM 100 MG PO CAPS
100.0000 mg | ORAL_CAPSULE | Freq: Two times a day (BID) | ORAL | Status: DC
  Administered 2016-11-02 – 2016-11-03 (×2): 100 mg via ORAL
  Filled 2016-11-02 (×2): qty 1

## 2016-11-02 MED ORDER — ONDANSETRON HCL 4 MG PO TABS: 4.0000 mg | ORAL_TABLET | Freq: Four times a day (QID) | ORAL | Status: DC | PRN

## 2016-11-02 MED ORDER — POLYETHYLENE GLYCOL 3350 17 G PO PACK: 17.0000 g | PACK | Freq: Every day | ORAL | Status: DC | PRN

## 2016-11-02 MED ORDER — SODIUM CHLORIDE 0.9% FLUSH: 3.0000 mL | INTRAVENOUS | Status: DC | PRN

## 2016-11-02 MED ORDER — SODIUM CHLORIDE 0.9% FLUSH
3.0000 mL | Freq: Two times a day (BID) | INTRAVENOUS | Status: DC
  Administered 2016-11-02: 22:00:00 3 mL via INTRAVENOUS

## 2016-11-02 NOTE — Consult Note (Signed)
Referring Physician: Mayford Knife    Chief Complaint: Fall, right sided weakness  HPI: [name] is an 71 y.o. female who is somewhat confused today and does not provide an accurate history.  It appears that the patient was smoking in her wheelchair today.  At some point in time it appears that she fell out of the wheelchair.  She was found prone on the ground.  She reports that she fell backward in her wheelchair and fell out.  It is not clear what actually happened.  When she was brought to the ED her right arm was weak.  This has been improving in the ED.  Initial NIHSS of 6.    Date last known well: Date: 11/02/2016 Time last known well: Unable to determine tPA Given: No: Unable to determine LKW  Past Medical History:  Diagnosis Date  . Asthma   . CHF (congestive heart failure) (HCC)   . Diabetes mellitus without complication (HCC)   . Hepatitis A   . Hepatitis B   . Hepatitis C   . Hypertension   . Respiratory failure (HCC)   . Schizophrenia (HCC)   . Seizures (HCC)   . Smoker    1 pack daily    History reviewed. No pertinent surgical history.  Family history:  Parents deceased.  Father from unknown cause.  Mother with CAD s/p MI.  Maternal grandmother with CAD s/p MI  Social History:  reports that she has been smoking Cigarettes.  She has a 50.00 pack-year smoking history. She does not have any smokeless tobacco history on file. Her alcohol and drug histories are not on file.  Allergies:  Allergies  Allergen Reactions  . Depakote [Valproic Acid] Other (See Comments)    unknown  . Haldol [Haloperidol Lactate] Other (See Comments)    Unknown   . Penicillins Other (See Comments)    Unknown   . Shellfish Allergy Other (See Comments)    unknown  . Trileptal [Oxcarbazepine] Other (See Comments)    unknown    Medications: I have reviewed the patient's current medications. Prior to Admission:  Prior to Admission medications   Medication Sig Start Date End Date  Taking? Authorizing Provider  acetaminophen (TYLENOL) 500 MG tablet Take 500 mg by mouth every 6 (six) hours as needed for pain.   Yes Historical Provider, MD  aspirin EC 81 MG tablet Take 81 mg by mouth daily.   Yes Historical Provider, MD  B Complex-C (B-COMPLEX WITH VITAMIN C) tablet Take 1 tablet by mouth daily.   Yes Historical Provider, MD  benztropine (COGENTIN) 0.5 MG tablet Take 0.5 mg by mouth 2 (two) times daily.   Yes Historical Provider, MD  calcium carbonate (TUMS - DOSED IN MG ELEMENTAL CALCIUM) 500 MG chewable tablet Chew 1 tablet by mouth 2 (two) times daily.   Yes Historical Provider, MD  clonazePAM (KLONOPIN) 0.5 MG tablet Take one tablet by mouth twice daily as needed for anxiety 08/14/13  Yes Mahima Glade Lloyd, MD  cyclobenzaprine (FLEXERIL) 5 MG tablet Take 5 mg by mouth 2 (two) times daily as needed for muscle spasms.   Yes Historical Provider, MD  docusate sodium (COLACE) 100 MG capsule Take 100 mg by mouth 2 (two) times daily.   Yes Historical Provider, MD  fluPHENAZine (PROLIXIN) 5 MG tablet Take 5 mg by mouth 3 (three) times daily.    Yes Historical Provider, MD  fluPHENAZine decanoate (PROLIXIN) 25 MG/ML injection Inject into the muscle every 14 (fourteen) days.  Yes Historical Provider, MD  lacosamide (VIMPAT) 200 MG TABS tablet Take one tablet by mouth twice daily 08/15/13  Yes Sharon SellerJessica K Eubanks, NP  lamoTRIgine (LAMICTAL) 200 MG tablet Take 200 mg by mouth 2 (two) times daily.    Yes Historical Provider, MD  levETIRAcetam (KEPPRA) 1000 MG tablet Take 1 tablet (1,000 mg total) by mouth 2 (two) times daily. 07/14/13  Yes Stephanie Couphristopher M Street, MD  magnesium oxide (MAG-OX) 400 MG tablet Take 400 mg by mouth daily.   Yes Historical Provider, MD  metoprolol (LOPRESSOR) 50 MG tablet Take 50 mg by mouth daily.    Yes Historical Provider, MD  omeprazole (PRILOSEC) 20 MG capsule Take 20 mg by mouth daily.   Yes Historical Provider, MD  oxybutynin (DITROPAN-XL) 5 MG 24 hr tablet  Take 5 mg by mouth 2 (two) times daily.   Yes Historical Provider, MD  phenytoin (DILANTIN) 100 MG ER capsule Take 100 mg by mouth 2 (two) times daily.    Yes Historical Provider, MD  rivaroxaban (XARELTO) 20 MG TABS tablet Take 20 mg by mouth daily.   Yes Historical Provider, MD  thiamine (VITAMIN B-1) 100 MG tablet Take 100 mg by mouth daily.   Yes Historical Provider, MD  insulin aspart (NOVOLOG FLEXPEN) 100 UNIT/ML SOPN FlexPen Inject 0-8 Units into the skin 3 (three) times daily with meals. Per blood sugar level sliding scale    Historical Provider, MD  mupirocin ointment (BACTROBAN) 2 % Apply to affected area 3 times daily 11/02/16 11/02/17  Emily FilbertJonathan E Williams, MD  warfarin (COUMADIN) 10 MG tablet Take 10 mg by mouth daily.    Historical Provider, MD     ROS: History obtained from the patient  General ROS: negative for - chills, fatigue, fever, night sweats, weight gain or weight loss Psychological ROS: negative for - behavioral disorder, hallucinations, memory difficulties, mood swings or suicidal ideation Ophthalmic ROS: negative for - blurry vision, double vision, eye pain or loss of vision ENT ROS: negative for - epistaxis, nasal discharge, oral lesions, sore throat, tinnitus or vertigo Allergy and Immunology ROS: negative for - hives or itchy/watery eyes Hematological and Lymphatic ROS: negative for - bleeding problems, bruising or swollen lymph nodes Endocrine ROS: negative for - galactorrhea, hair pattern changes, polydipsia/polyuria or temperature intolerance Respiratory ROS: negative for - cough, hemoptysis, shortness of breath or wheezing Cardiovascular ROS: negative for - chest pain, dyspnea on exertion, edema or irregular heartbeat Gastrointestinal ROS: negative for - abdominal pain, diarrhea, hematemesis, nausea/vomiting or stool incontinence Genito-Urinary ROS: negative for - dysuria, hematuria, incontinence or urinary frequency/urgency Musculoskeletal ROS: negative for -  joint swelling or muscular weakness Neurological ROS: as noted in HPI Dermatological ROS: facial abrasions  Physical Examination: Blood pressure (!) 145/77, pulse 62, temperature 98.5 F (36.9 C), temperature source Oral, resp. rate 17, height 5' (1.524 m), weight 72.6 kg (160 lb), SpO2 99 %.  HEENT-  Normocephalic, abrasions on face and nose with bleeding noted.  Normal external eye and conjunctiva.  Normal TM's bilaterally.  Normal auditory canals and external ears. Normal external nose, mucus membranes and septum.  Normal pharynx. Cardiovascular- S1, S2 normal, pulses palpable throughout   Lungs- chest clear, no wheezing, rales, normal symmetric air entry Abdomen- soft, non-tender; bowel sounds normal; no masses,  no organomegaly Extremities- no edema Lymph-no adenopathy palpable Musculoskeletal-no joint tenderness, deformity or swelling Skin-warm and dry, no hyperpigmentation, vitiligo, or suspicious lesions  Neurological Examination   Mental Status: Alert, oriented, some confusion noted.  Speech  fluent without evidence of aphasia with some mild dysarthria.  Able to follow 3 step commands without difficulty. Cranial Nerves: II: Discs flat bilaterally; Visual fields grossly normal, pupils equal, round, reactive to light and accommodation III,IV, VI: ptosis not present, extra-ocular motions intact bilaterally V,VII: left facial droop, facial light touch sensation normal bilaterally VIII: hearing normal bilaterally IX,X: gag reflex present XI: bilateral shoulder shrug XII: midline tongue extension Motor: Right : Upper extremity   3/5    Left:     Upper extremity   5/5  Lower extremity   2/5     Lower extremity   2/5 Tone and bulk:normal tone throughout; no atrophy noted Sensory: Pinprick and light touch intact throughout, bilaterally Deep Tendon Reflexes: 2+ and symmetric with absent AJ's bilaterally Plantars: Right: mute   Left: mute Cerebellar: Normal finger-to-nose and normal  heel-to-shin testing bilaterally Gait: not tested due to safety concerns     Laboratory Studies:  Basic Metabolic Panel: No results for input(s): NA, K, CL, CO2, GLUCOSE, BUN, CREATININE, CALCIUM, MG, PHOS in the last 168 hours.  Liver Function Tests: No results for input(s): AST, ALT, ALKPHOS, BILITOT, PROT, ALBUMIN in the last 168 hours. No results for input(s): LIPASE, AMYLASE in the last 168 hours. No results for input(s): AMMONIA in the last 168 hours.  CBC: No results for input(s): WBC, NEUTROABS, HGB, HCT, MCV, PLT in the last 168 hours.  Cardiac Enzymes: No results for input(s): CKTOTAL, CKMB, CKMBINDEX, TROPONINI in the last 168 hours.  BNP: Invalid input(s): POCBNP  CBG: No results for input(s): GLUCAP in the last 168 hours.  Microbiology: Results for orders placed or performed in visit on 11/20/13  Urine culture     Status: None   Collection Time: 11/20/13 11:43 AM  Result Value Ref Range Status   Micro Text Report   Final       SOURCE: CLEAN CATCH    COMMENT                   MIXED BACTERIAL ORGANISMS   COMMENT                   RESULTS SUGGESTIVE OF CONTAMINATION   ANTIBIOTIC                                                        Coagulation Studies: No results for input(s): LABPROT, INR in the last 72 hours.  Urinalysis: No results for input(s): COLORURINE, LABSPEC, PHURINE, GLUCOSEU, HGBUR, BILIRUBINUR, KETONESUR, PROTEINUR, UROBILINOGEN, NITRITE, LEUKOCYTESUR in the last 168 hours.  Invalid input(s): APPERANCEUR  Lipid Panel:    Component Value Date/Time   CHOL 120 02/14/2013 0140   TRIG 147 02/14/2013 0140   HDL 41 02/14/2013 0140   VLDL 29 02/14/2013 0140   LDLCALC 50 02/14/2013 0140    HgbA1C:  Lab Results  Component Value Date   HGBA1C 6.7 (H) 07/06/2013    Urine Drug Screen:      Component Value Date/Time   LABOPIA NONE DETECTED 07/05/2013 1433   COCAINSCRNUR NONE DETECTED 07/05/2013 1433   COCAINSCRNUR NEGATIVE 02/13/2013  1428   LABBENZ POSITIVE (A) 07/05/2013 1433   AMPHETMU NONE DETECTED 07/05/2013 1433   THCU NONE DETECTED 07/05/2013 1433   LABBARB NONE DETECTED 07/05/2013 1433    Alcohol Level: No results for  input(s): ETH in the last 168 hours.  Other results: EKG: sinus rhythm at 88 bpm.  Imaging: Ct Head Wo Contrast  Result Date: 11/02/2016 CLINICAL DATA:  Unwitnessed fall today. EXAM: CT HEAD WITHOUT CONTRAST TECHNIQUE: Contiguous axial images were obtained from the base of the skull through the vertex without intravenous contrast. COMPARISON:  12/11/2015 FINDINGS: Brain: There is no evidence for acute hemorrhage, hydrocephalus, mass lesion, or abnormal extra-axial fluid collection. No definite CT evidence for acute infarction. Diffuse loss of parenchymal volume is consistent with atrophy. Patchy low attenuation in the deep hemispheric and periventricular white matter is nonspecific, but likely reflects chronic microvascular ischemic demyelination. Vascular: No hyperdense vessel or unexpected calcification. Skull: No evidence for fracture. No worrisome lytic or sclerotic lesion. Sinuses/Orbits: The visualized paranasal sinuses and mastoid air cells are clear. Visualized portions of the globes and intraorbital fat are unremarkable. Other: Focal scalp contusion identified left frontal region. IMPRESSION: 1. Stable.  No acute intracranial abnormality. 2. Atrophy with mild chronic small vessel white matter ischemic disease. Electronically Signed   By: Kennith Center M.D.   On: 11/02/2016 13:26    Assessment: 71 y.o. female with a history of stroke and seizures who presents after being found down.  Patient amnestic of most of the events surrounding her presentation.  Initial weakness quickly improving.  Although patient may have had a TIA can not rule out the possibility of a seizure as well.  Patient on maximum medical therapy of Xarelto and ASA.  On Keppra, Lamictal, Vimpat, Klonopin and Dilantin for seizures.   Further work up recommended.    Stroke Risk Factors - diabetes mellitus and smoking  Plan: 1. HgbA1c, fasting lipid panel, dilantin level.   2. MRI, MRA  of the brain without contrast 3. PT consult, OT consult, Speech consult 4. Echocardiogram 5. Carotid dopplers 6. Prophylactic therapy-Continue ASA and Xarelto 7. NPO until RN stroke swallow screen 8. Telemetry monitoring 9. Frequent neuro checks 10. If stroke work up unremarkable would consider adjustment of AED therapy.      Case discussed with Dr. Newton Pigg, MD Neurology [phone] 11/02/2016, 2:51 PM

## 2016-11-02 NOTE — ED Notes (Signed)
Right sided weakness noted.

## 2016-11-02 NOTE — ED Triage Notes (Signed)
Per [facility], patient comes from [facility]. Patient had an unwitnessed fall today while outside. Patient was found prone and assisted up by staff. Staff report patient had left sided weakness. Hx of CVA with left sided deficits. FD reports heartrate was initially 30 and they were unable to palpate a blood pressure. CBG 125. EMS gave 300cc of NS. Patient alert. Lacerations note to left cheek below eye and above eyebrow.

## 2016-11-02 NOTE — ED Provider Notes (Signed)
[facility] Emergency Department Provider Note        Time seen: ----------------------------------------- 12:48 PM on 11/02/2016 -----------------------------------------    I have reviewed the triage vital signs and the nursing notes.   HISTORY  Chief Complaint Fall    HPI [name] is a 71 y.o. female who presents to the ER being sent from [facility] for an unwitnessed fall. Patient was outside today and states she was trying to put out her cigarette she fell backwards. She was found prone and assisted by staff. Staff reports she had left-sided weakness and reportedly had a history of CVA but there is no documentation of this. Our department reports heart rate was slow initially but that has not been seen here. EMS gave 300 cc of normal saline. She is noted to have facial abrasions and contusions.   Past Medical History:  Diagnosis Date  . Asthma   . CHF (congestive heart failure) (HCC)   . Diabetes mellitus without complication (HCC)   . Hepatitis A   . Hepatitis B   . Hepatitis C   . Hypertension   . Respiratory failure (HCC)   . Schizophrenia (HCC)   . Seizures (HCC)   . Smoker    1 pack daily    Patient Active Problem List   Diagnosis Date Noted  . Bronchiectasis without acute exacerbation (HCC) 10/17/2013  . Other convulsions 09/19/2013  . Encounter for long-term (current) use of other medications 09/19/2013  . Hypercalcemia 09/19/2013  . Atrial fibrillation (HCC) 08/14/2013  . Congestive heart failure, unspecified 08/14/2013  . Hypopotassemia 08/14/2013  . Seizure disorder (HCC) 07/14/2013  . Schizophrenia (HCC) 07/14/2013  . HTN (hypertension) 07/14/2013  . Sepsis (HCC) 07/14/2013  . UTI (urinary tract infection) 07/14/2013  . Altered mental status 04/01/2013  . Anemia 03/31/2013  . Status epilepticus (HCC) 03/28/2013  . Acute respiratory failure (HCC) 03/28/2013  . DM (diabetes mellitus) (HCC)  03/28/2013    History reviewed. No pertinent surgical history.  Allergies Depakote [valproic acid]; Haldol [haloperidol lactate]; Penicillins; Shellfish allergy; and Trileptal [oxcarbazepine]  Social History Social History  Substance Use Topics  . Smoking status: Current Every Day Smoker    Packs/day: 1.00    Years: 50.00    Types: Cigarettes  . Smokeless tobacco: Not on file  . Alcohol use Not on file   Review of Systems Constitutional: Negative for fever. Cardiovascular: Negative for chest pain. Respiratory: Negative for shortness of breath. Gastrointestinal: Negative for abdominal pain, vomiting and diarrhea. Genitourinary: Negative for dysuria. Musculoskeletal: Negative for back pain. Skin: Negative for rash. Neurological: Positive for headache, weakness  10-point ROS otherwise negative.  ____________________________________________   PHYSICAL EXAM:  VITAL SIGNS: ED Triage Vitals  Enc Vitals Group     BP 11/02/16 1222 117/73     Pulse Rate 11/02/16 1222 62     Resp 11/02/16 1222 16     Temp 11/02/16 1222 98.5 F (36.9 C)     Temp Source 11/02/16 1222 Oral     SpO2 11/02/16 1222 95 %     Weight 11/02/16 1224 160 lb (72.6 kg)     Height 11/02/16 1224 5' (1.524 m)     Head Circumference --      Peak Flow --      Pain Score 11/02/16 1225 8     Pain Loc --      Pain Edu? --      Excl. in GC? --    Constitutional:  Alert and oriented. Well appearing and in no distress. Eyes: Conjunctivae are normal. PERRL. Normal extraocular movements. ENT   Head: Normocephalic, Left forehead and periorbital contusions and abrasions are noted. No identified a laceration   Nose: Recent bleeding noted to the left naris. No active bleeding   Mouth/Throat: Mucous membranes are moist.   Neck: No stridor. Cardiovascular: Normal rate, regular rhythm. No murmurs, rubs, or gallops. Respiratory: Normal respiratory effort without tachypnea nor retractions. Breath sounds are  clear and equal bilaterally. No wheezes/rales/rhonchi. Gastrointestinal: Soft and nontender. Normal bowel sounds Musculoskeletal: Nontender with normal range of motion in all extremities. No lower extremity tenderness nor edema. Neurologic:  Normal speech and language. Generalized weakness, right arm weakness is appreciated compared to left, possible right leg weakness and left facial droop Skin:  Skin is warm, dry with Facial contusions and abrasions Psychiatric: Mood and affect are normal. Speech and behavior are normal.  ____________________________________________  EKG: Interpreted by me. Sinus rhythm rate 88 bpm, normal PR interval, normal QRS, normal QT.  ____________________________________________  ED COURSE:  Pertinent labs & imaging results that were available during my care of the patient were reviewed by me and considered in my medical decision making (see chart for details). Patient presents to the ER after a fall of uncertain etiology. We will assess with labs and imaging. Clinical Course as of Nov 03 1514  Tue Nov 02, 2016  1446 It is unclear as to the time of onset of her symptoms or specifically the symptoms themselves.  [JW]  1446 Patient apparently has right-sided weakness but is unclear how she fell backwards but has facial injuries.  [JW]  1447 Patient has been evaluated by neurology in the ER.  [JW]  1514 Patient is adamant that she will not stay in the hospital and is leaving AGAINST MEDICAL ADVICE.  [JW]    Clinical Course User Index [JW] Emily FilbertJonathan E Williams, MD   Procedures ____________________________________________   LABS (pertinent positives/negatives)  Labs Reviewed  CBC WITH DIFFERENTIAL/PLATELET  COMPREHENSIVE METABOLIC PANEL  TROPONIN I  URINALYSIS, COMPLETE (UACMP) WITH MICROSCOPIC    RADIOLOGY Images were viewed by me  CT head  IMPRESSION: 1. Stable. No acute intracranial abnormality.  2. 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 ____________________________________________  FINAL ASSESSMENT AND PLAN  Fall, head injury, Possible CVA, Right arm weakness, facial abrasions  Plan: Patient with labs and imaging as dictated above. Patient presents to the ER with symptoms of possible CVA after a fall. Our plan was for admission but she is refusing any further care. She is alert and oriented and appears to be capable of making decisions for herself. I've advised her to return for admission whenever possible.   Emily FilbertWilliams, Jonathan E, MD   Note: This note was generated in part or whole with voice recognition software. Voice recognition is usually quite accurate but there are transcription errors that can and very often do occur. I apologize for any typographical errors that were not detected and corrected.     Emily FilbertJonathan E Williams, MD 11/02/16 [phone]

## 2016-11-02 NOTE — ED Notes (Signed)
Social services has called twice asking about pt. Informed that she did not want to be admitted and wanted to leave AMA. Social services stated [facility] and both agree that they want pt admitted for observation over night. Pt was previously leaving AMA. Pt appears alert and oriented. Pt does not want to stay. Social Services in charge of pt's case stated to this RN that she is a ward of the state and does NOT have the right to make her own medical decision. This RN asked social services this multiple times and they repeated that pt can't make her own medical decision. ED doctor notified and admitting MD notified.

## 2016-11-02 NOTE — H&P (Signed)
[facility] [facility] [place] [facility]   PATIENT NAME: [name]    MR#:  [id number]  DATE OF BIRTH:  July 29, 1946  DATE OF ADMISSION:  11/02/2016  PRIMARY CARE PHYSICIAN: Lorenda Ishihara, MD   REQUESTING/REFERRING PHYSICIAN: Dr. Mayford Knife  CHIEF COMPLAINT:   Chief Complaint  Patient presents with  . Fall    HISTORY OF PRESENT ILLNESS:  [name]  is a 71 y.o. female with a known history of Schizophrenia, seizure, hepatitis A, B, and C, diabetes, CHF, asthma presented from [facility] after she was found down on the floor. She had unwitnessed fall from wheelchair. She mentions that she was trying to put out a cigarette and fell out of the wheelchair. She was noticed to have acute facial droop and left-sided weakness at the group home and was sent to the emergency room. Here she has been found to have some right-sided weakness and left lower extremity weakness and is being admitted for stroke workup. Seen by neurology Dr. Thad Ranger in the emergency room.  Initially admission was recommended. Patient refused this. 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  PAST MEDICAL HISTORY:   Past Medical History:  Diagnosis Date  . Asthma   . CHF (congestive heart failure) (HCC)   . Diabetes mellitus without complication (HCC)   . Hepatitis A   . Hepatitis B   . Hepatitis C   . Hypertension   . Respiratory failure (HCC)   . Schizophrenia (HCC)   . Seizures (HCC)   . Smoker    1 pack daily    PAST SURGICAL HISTORY:  History reviewed. No pertinent surgical history.  SOCIAL HISTORY:   Social History  Substance Use Topics  . Smoking status: Current Every Day Smoker    Packs/day: 1.00    Years: 50.00    Types: Cigarettes  . Smokeless tobacco: Not on file  . Alcohol use Not on file    FAMILY HISTORY:  No family history on file.  DRUG ALLERGIES:   Allergies  Allergen Reactions  .  Depakote [Valproic Acid] Other (See Comments)    unknown  . Haldol [Haloperidol Lactate] Other (See Comments)    Unknown   . Penicillins Other (See Comments)    Unknown   . Shellfish Allergy Other (See Comments)    unknown  . Trileptal [Oxcarbazepine] Other (See Comments)    unknown    REVIEW OF SYSTEMS:   Review of Systems  Constitutional: Positive for malaise/fatigue. Negative for chills and fever.  HENT: Negative for sore throat.   Eyes: Negative for blurred vision, double vision and pain.  Respiratory: Negative for cough, hemoptysis, shortness of breath and wheezing.   Cardiovascular: Negative for chest pain, palpitations, orthopnea and leg swelling.  Gastrointestinal: Negative for abdominal pain, constipation, diarrhea, heartburn, nausea and vomiting.  Genitourinary: Negative for dysuria and hematuria.  Musculoskeletal: Positive for back pain, falls and joint pain.  Skin: Negative for rash.  Neurological: Positive for focal weakness and weakness. Negative for sensory change, speech change and headaches.  Endo/Heme/Allergies: Does not bruise/bleed easily.  Psychiatric/Behavioral: Negative for depression. The patient is not nervous/anxious.     MEDICATIONS AT HOME:   Prior to Admission medications   Medication Sig Start Date End Date Taking? Authorizing Provider  acetaminophen (TYLENOL) 500 MG tablet Take 500 mg by mouth every 6 (six) hours as needed for pain.   Yes Historical Provider, MD  aspirin EC 81 MG tablet Take 81 mg by mouth daily.   Yes Historical Provider, MD  B Complex-C (B-COMPLEX WITH VITAMIN C) tablet Take 1 tablet by mouth daily.   Yes Historical Provider, MD  benztropine (COGENTIN) 0.5 MG tablet Take 0.5 mg by mouth 2 (two) times daily.   Yes Historical Provider, MD  calcium carbonate (TUMS - DOSED IN MG ELEMENTAL CALCIUM) 500 MG chewable tablet Chew 1 tablet by mouth 2 (two) times daily.   Yes Historical Provider, MD  clonazePAM (KLONOPIN) 0.5 MG tablet  Take one tablet by mouth twice daily as needed for anxiety 08/14/13  Yes Mahima Glade LloydPandey, MD  cyclobenzaprine (FLEXERIL) 5 MG tablet Take 5 mg by mouth 2 (two) times daily as needed for muscle spasms.   Yes Historical Provider, MD  docusate sodium (COLACE) 100 MG capsule Take 100 mg by mouth 2 (two) times daily.   Yes Historical Provider, MD  fluPHENAZine (PROLIXIN) 5 MG tablet Take 5 mg by mouth 3 (three) times daily.    Yes Historical Provider, MD  fluPHENAZine decanoate (PROLIXIN) 25 MG/ML injection Inject into the muscle every 14 (fourteen) days.   Yes Historical Provider, MD  lacosamide (VIMPAT) 200 MG TABS tablet Take one tablet by mouth twice daily 08/15/13  Yes Sharon SellerJessica K Eubanks, NP  lamoTRIgine (LAMICTAL) 200 MG tablet Take 200 mg by mouth 2 (two) times daily.    Yes Historical Provider, MD  levETIRAcetam (KEPPRA) 1000 MG tablet Take 1 tablet (1,000 mg total) by mouth 2 (two) times daily. 07/14/13  Yes Stephanie Couphristopher M Street, MD  magnesium oxide (MAG-OX) 400 MG tablet Take 400 mg by mouth daily.   Yes Historical Provider, MD  metoprolol (LOPRESSOR) 50 MG tablet Take 50 mg by mouth daily.    Yes Historical Provider, MD  omeprazole (PRILOSEC) 20 MG capsule Take 20 mg by mouth daily.   Yes Historical Provider, MD  oxybutynin (DITROPAN-XL) 5 MG 24 hr tablet Take 5 mg by mouth 2 (two) times daily.   Yes Historical Provider, MD  phenytoin (DILANTIN) 100 MG ER capsule Take 100 mg by mouth 2 (two) times daily.    Yes Historical Provider, MD  rivaroxaban (XARELTO) 20 MG TABS tablet Take 20 mg by mouth daily.   Yes Historical Provider, MD  thiamine (VITAMIN B-1) 100 MG tablet Take 100 mg by mouth daily.   Yes Historical Provider, MD  insulin aspart (NOVOLOG FLEXPEN) 100 UNIT/ML SOPN FlexPen Inject 0-8 Units into the skin 3 (three) times daily with meals. Per blood sugar level sliding scale    Historical Provider, MD  mupirocin ointment (BACTROBAN) 2 % Apply to affected area 3 times daily 11/02/16 11/02/17   Emily FilbertJonathan E Williams, MD  warfarin (COUMADIN) 10 MG tablet Take 10 mg by mouth daily.    Historical Provider, MD     VITAL SIGNS:  Blood pressure (!) 141/125, pulse 84, temperature 97.7 F (36.5 C), resp. rate (!) 26, height 5' (1.524 m), weight 72.6 kg (160 lb), SpO2 96 %.  PHYSICAL EXAMINATION:  Physical Exam  GENERAL:  71 y.o.-year-old patient lying in the bed with no acute distress.  EYES: Pupils equal, round, reactive to light and accommodation. No scleral icterus. Extraocular muscles intact.  HEENT: Head atraumatic, normocephalic. Oropharynx and nasopharynx clear. No oropharyngeal erythema, moist oral mucosa  NECK:  Supple, no jugular venous distention. No thyroid enlargement, no tenderness.  LUNGS: Normal breath sounds bilaterally, no wheezing, rales, rhonchi. No use of accessory muscles of respiration.  CARDIOVASCULAR: S1,  S2 normal. No murmurs, rubs, or gallops.  ABDOMEN: Soft, nontender, nondistended. Bowel sounds present. No organomegaly or mass.  EXTREMITIES: No pedal edema, cyanosis, or clubbing. + 2 pedal & radial pulses b/l.   NEUROLOGIC: Cranial nerves II through XII are intact.  RUE AND RLE 3/5. LLE 3/5. LUE 5/5 PSYCHIATRIC: The patient is alert and oriented x 3. Good affect.  SKIN: No obvious rash, lesion, or ulcer.   LABORATORY PANEL:   CBC No results for input(s): WBC, HGB, HCT, PLT in the last 168 hours. ------------------------------------------------------------------------------------------------------------------  Chemistries  No results for input(s): NA, K, CL, CO2, GLUCOSE, BUN, CREATININE, CALCIUM, MG, AST, ALT, ALKPHOS, BILITOT in the last 168 hours.  Invalid input(s): GFRCGP ------------------------------------------------------------------------------------------------------------------  Cardiac Enzymes No results for input(s): TROPONINI in the last 168  hours. ------------------------------------------------------------------------------------------------------------------  RADIOLOGY:  Ct Head Wo Contrast  Result Date: 11/02/2016 CLINICAL DATA:  Unwitnessed fall today. EXAM: CT HEAD WITHOUT CONTRAST TECHNIQUE: Contiguous axial images were obtained from the base of the skull through the vertex without intravenous contrast. COMPARISON:  12/11/2015 FINDINGS: Brain: There is no evidence for acute hemorrhage, hydrocephalus, mass lesion, or abnormal extra-axial fluid collection. No definite CT evidence for acute infarction. Diffuse loss of parenchymal volume is consistent with atrophy. Patchy low attenuation in the deep hemispheric and periventricular white matter is nonspecific, but likely reflects chronic microvascular ischemic demyelination. Vascular: No hyperdense vessel or unexpected calcification. Skull: No evidence for fracture. No worrisome lytic or sclerotic lesion. Sinuses/Orbits: The visualized paranasal sinuses and mastoid air cells are clear. Visualized portions of the globes and intraorbital fat are unremarkable. Other: Focal scalp contusion identified left frontal region. IMPRESSION: 1. Stable.  No acute intracranial abnormality. 2. Atrophy with mild chronic small vessel white matter ischemic disease. Electronically Signed   By: Kennith Center M.D.   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 -Check MRI of the brain, Carotid dopplers, Echo - Start aspirin and statin. - Lovenox for DVT prophylaxis. - PT/OT/Speech consult as needed per symptoms - Neuro checks every 4 hours for 24 hours. - Consult neurology.  * Hypertension Continue home medication  * Diabetes mellitus. Home medications and sliding scale insulin  * Schizophrenia Continue home medication  * DVT prophylaxis. Patient is on Coumadin.   All the records are  reviewed and case discussed with ED provider. Management plans discussed with the patient, family and they are in agreement.  CODE STATUS: FULL CODE  TOTAL TIME TAKING CARE OF THIS PATIENT: 40 minutes.   Milagros Loll R M.D on 11/02/2016 at 5:53 PM  Between 7am to 6pm - Pager - [phone]  After 6pm go to [email or web address] - password [email or web address] [facility]  [facility] [place] [facility]  Office  [phone]  CC: Primary care physician; Lorenda Ishihara, MD  Note: This dictation was prepared with Dragon dictation along with smaller phrase technology. Any transcriptional errors that result from this process are unintentional.

## 2016-11-02 NOTE — ED Notes (Signed)
No documented history of CVA. MD notified.

## 2016-11-02 NOTE — ED Notes (Signed)
Unsuccessful butterfly sticks by this RN, asked stephanie RN to take a look.

## 2016-11-02 NOTE — Progress Notes (Signed)
CH responded to a PG concerning Pt in ED-01. Pt was being attended to upon my arrival. Pt had scrapes on her forehead and nose. Pt states she was in a wheel chair which overturned as she attempted to extinguish a cigarette. Pt had no family  members she wanted contacted. Seeing she was in a good state, CH exited. CH is available for follow up as needed.    11/02/16 1600  Clinical Encounter Type  Visited With Patient;Health care provider  Visit Type Initial;Spiritual support;ED  Referral From Nurse  Consult/Referral To Chaplain  Spiritual Encounters  Spiritual Needs Emotional

## 2016-11-03 ENCOUNTER — Observation Stay: Payer: Medicare Other

## 2016-11-03 ENCOUNTER — Observation Stay
Admit: 2016-11-03 | Discharge: 2016-11-03 | Disposition: A | Discharge status: Home / Self Care | Payer: Medicare Other | Attending: Internal Medicine | Admitting: Internal Medicine

## 2016-11-03 DIAGNOSIS — Z22322 Carrier or suspected carrier of Methicillin resistant Staphylococcus aureus: Secondary | ICD-10-CM

## 2016-11-03 DIAGNOSIS — R531 Weakness: Secondary | ICD-10-CM

## 2016-11-03 DIAGNOSIS — E785 Hyperlipidemia, unspecified: Secondary | ICD-10-CM

## 2016-11-03 DIAGNOSIS — F0390 Unspecified dementia without behavioral disturbance: Secondary | ICD-10-CM

## 2016-11-03 LAB — ECHOCARDIOGRAM COMPLETE
HEIGHTINCHES: 65 in
WEIGHTICAEL: 2404.8 [oz_av]

## 2016-11-03 LAB — GLUCOSE, CAPILLARY
Glucose-Capillary: 147 mg/dL — ABNORMAL HIGH (ref 65–99)
Glucose-Capillary: 149 mg/dL — ABNORMAL HIGH (ref 65–99)
Glucose-Capillary: 154 mg/dL — ABNORMAL HIGH (ref 65–99)

## 2016-11-03 LAB — LIPID PANEL
CHOLESTEROL: 211 mg/dL — AB (ref 0–200)
HDL: 82 mg/dL (ref >40)
LDL CALC: 114 mg/dL — AB (ref 0–99)
TRIGLYCERIDES: 75 mg/dL (ref <150)
Total CHOL/HDL Ratio: 2.6 RATIO
VLDL: 15 mg/dL (ref 0–40)

## 2016-11-03 LAB — MRSA PCR SCREENING: MRSA BY PCR: POSITIVE — AB

## 2016-11-03 LAB — PHENYTOIN LEVEL, TOTAL: Phenytoin Lvl: 4.6 ug/mL — ABNORMAL LOW (ref 10.0–20.0)

## 2016-11-03 MED ORDER — CHLORHEXIDINE GLUCONATE CLOTH 2 % EX PADS
6.0000 | MEDICATED_PAD | Freq: Every day | CUTANEOUS | Status: DC
  Administered 2016-11-03: 6 via TOPICAL

## 2016-11-03 MED ORDER — ATORVASTATIN CALCIUM 20 MG PO TABS: 40.0000 mg | ORAL_TABLET | Freq: Every day | ORAL | Status: DC

## 2016-11-03 MED ORDER — MUPIROCIN 2 % EX OINT
1.0000 "application " | TOPICAL_OINTMENT | Freq: Two times a day (BID) | CUTANEOUS | Status: DC
  Administered 2016-11-03 (×2): 1 via NASAL
  Filled 2016-11-03: qty 22

## 2016-11-03 MED ORDER — ATORVASTATIN CALCIUM 40 MG PO TABS: 40.0000 mg | ORAL_TABLET | Freq: Every day | ORAL | 5 refills | Status: AC

## 2016-11-03 NOTE — Evaluation (Signed)
Physical Therapy Evaluation Patient Details Name: [name] MRN: [id number] DOB: 1946-08-04 Today's Date: 11/03/2016   History of Present Illness  Pt presented to ED with c/o R and L sided weakness s/p falling out of w/c. Pt and pt's social worker Meredith Staggers) state pt has been w/c bound for years and has assistance at [facility] (ALF). Pt with hx of schizophrenia and seizures, please see PMH for further details.  Clinical Impression  Pt is a 70y/o female presenting to ED with c/o R and L sided weakness s/p fall from w/c at [facility] and CVA suspected. Per MD note, CT imaging negative for CVA but pt currently awaiting MRI for further evaluation. Pt very reluctant at first to perform mobility activities with PT, but pt's social worker Meredith Staggers), convinced pt to try. Pt required increased time and min A during sidelying to sit and sit to supine 2/2 weakness, R hand pain, and impaired balance. Pt required min A during first attempt at sitting upright at EOB, but progressed to S during weight shifting side to side. Pt noted to experienced decr. Coordination during nose to finger testing (L side only, as pt refused to move R UE 2/2 IV site). Pt's sensation intact but pt reported tingling in R hand 2/2 IV site. Pt refused MMT but was able to move B LE against gravity in limited range for at least 2+/5. Pt able to move L UE against gravity for 3/5 and refused any R UE movement. STS txfs and amb. Not attempted as pt and social worker reported pt is w/c bound, and pt did not wish to attempt.  Pt would benefit from skilled PT to address above deficits and promote optimal return to PLOF. PT recommends transition to HHPT upon discharge from acute hospitalization, as long as pt receives assist at [facility], as reported by pt and Child psychotherapist, Reshia. If pt does not receive assist for mobility and ADLs, pt would need to d/c to SNF.      Follow Up Recommendations Home health  PT;Supervision/Assistance - 24 hour ([facility]PT at [facility] or SNF if no assist at current ALF)    Equipment Recommendations       Recommendations for Other Services       Precautions / Restrictions Precautions Precautions: Fall Restrictions Weight Bearing Restrictions: No      Mobility  Bed Mobility Overal bed mobility: Needs Assistance Bed Mobility: Sidelying to Sit;Sit to Supine   Sidelying to sit: Min assist;HOB elevated   Sit to supine: Min assist   General bed mobility comments: Pt required min A during sidelying to sit to guide trunk into seated position on EOB. Pt required min A during sit to supine to guide 1 LE into bed and to guide trunk. Pt did required +2 assist to slide towward HOB.  Transfers Overall transfer level: Needs assistance Equipment used: None (pt refused to trial standing, as she states she's w/c bound)                Ambulation/Gait Ambulation/Gait assistance:  (not attempted)              Stairs            Wheelchair Mobility    Modified Rankin (Stroke Patients Only)       Balance Overall balance assessment: Needs assistance Sitting-balance support: Bilateral upper extremity supported;Feet unsupported Sitting balance-Leahy Scale: Fair Sitting balance - Comments: Pt progressed from fair to good seated balance, as she  was able to weight shift from side to side with S to ensure safety. Postural control: Posterior lean                                   Pertinent Vitals/Pain Pain Assessment: Faces Pain Score:  (pt unable to rate reports her R hand hurts at IV site) Faces Pain Scale: Hurts little more Pain Location: R hand Pain Descriptors / Indicators:  (sharp) Pain Intervention(s):  (RN informed and CNA informed)    Home Living Family/patient expects to be discharged to:: Group home                      Prior Function Level of Independence: Needs assistance   Gait / Transfers Assistance  Needed: Pt unable to amb. for years and uses w/c for all mobility  ADL's / Homemaking Assistance Needed: Lives at [facility] (has assist per pt and social worker-Reshia)        Higher education careers adviser        Extremity/Trunk Assessment   Upper Extremity Assessment Upper Extremity Assessment: Generalized weakness;RUE deficits/detail;LUE deficits/detail RUE Deficits / Details: Pt refused to move R UE 2/2 pain from IV site in R hand, PT was able to perform PROM shoulder flexion WNL.  RUE Sensation:  (Pt reported tingling in R hand 2/2 IV site,light touch intac) LUE Deficits / Details: L AROM WFL but pt refused MMT LUE Sensation:  (No c/o N/T and sensation intact)    Lower Extremity Assessment Lower Extremity Assessment: Generalized weakness;RLE deficits/detail;LLE deficits/detail RLE Deficits / Details: Pt able to perform dorsilexion/plantarflexion and kicking/bending of knee but refused MMT. RLE Sensation:  (WNL) LLE Deficits / Details: Pt able to perform dorsiflexion/plantarflexion and kicking/bending of knee but refused MMT. LLE Sensation:  (WNL)       Communication   Communication: No difficulties  Cognition Arousal/Alertness: Awake/alert Behavior During Therapy: Agitated Overall Cognitive Status: Within Functional Limits for tasks assessed                 General Comments: Pt became agitated when she asked her social worker if she could leave with her, and social worker told her the MD needs to run more tests.     General Comments      Exercises     Assessment/Plan    PT Assessment Patient needs continued PT services  PT Problem List Decreased strength;Decreased range of motion;Decreased activity tolerance;Decreased balance;Decreased mobility;Decreased coordination;Decreased knowledge of use of DME;Decreased safety awareness;Pain       PT Treatment Interventions Therapeutic activities    PT Goals (Current goals can be found in the Care Plan section)  Acute  Rehab PT Goals Patient Stated Goal: "To go home" PT Goal Formulation: With patient Potential to Achieve Goals: Good    Frequency Min 2X/week   Barriers to discharge   Pt lives at [facility], need to verify level of assist. If pt does not have assist, she would likely need to d/c to SNF.    Co-evaluation               End of Session   Activity Tolerance: Patient limited by pain Patient left: in bed;with call bell/phone within reach;with bed alarm set;with family/visitor present Nurse Communication: Mobility status;Other (comment) (PT spoke with Steward Drone, RN re: mobility and pt'sprior function) PT Visit Diagnosis: Muscle weakness (generalized) (M62.81);History of falling (Z91.81);Unsteadiness on feet (R26.81);Other abnormalities  of gait and mobility (R26.89)    Functional Assessment Tool Used: AM-PAC 6 Clicks Basic Mobility Functional Limitation: Mobility: Walking and moving around Mobility: Walking and Moving Around Current Status ([id number](G8978): At least 60 percent but less than 80 percent impaired, limited or restricted Mobility: Walking and Moving Around Goal Status [phone](G8979): At least 20 percent but less than 40 percent impaired, limited or restricted    Time: 1145-1217 PT Time Calculation (min) (ACUTE ONLY): 32 min   Charges:   PT Evaluation $PT Eval Moderate Complexity: 1 Procedure PT Treatments $Therapeutic Activity: 8-22 mins   PT G Codes:   PT G-Codes **NOT FOR INPATIENT CLASS** Functional Assessment Tool Used: AM-PAC 6 Clicks Basic Mobility Functional Limitation: Mobility: Walking and moving around Mobility: Walking and Moving Around Current Status ([id number](G8978): At least 60 percent but less than 80 percent impaired, limited or restricted Mobility: Walking and Moving Around Goal Status [phone](G8979): At least 20 percent but less than 40 percent impaired, limited or restricted     Aybree Lanyon L 11/03/2016, 12:36 PM  Zerita BoersJennifer Talon Witting, PT,DPT 11/03/16 12:44 PM
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
## 2016-11-03 NOTE — NC FL2 (Addendum)
[place] MEDICAID FL2 LEVEL OF CARE SCREENING TOOL     IDENTIFICATION  Patient Name: [name] FrancoBeverly A Mccleod Birthdate: July 06, 1946 Sex: female Admission Date (Current Location): 11/02/2016  [facility] [place]   Facility and Address:  [facility], [address], [place], [place] [address]      Provider Number: [id number]  Attending Physician Name and Address:  Katharina Caperima Vaickute, MD  Relative Name and Phone Number:       Current Level of Care: Hospital Recommended Level of Care: Family Care Home Prior Approval Number:    Date Approved/Denied:   PASRR Number:    Discharge Plan: Domiciliary (Rest home)    Current Diagnoses: Patient Active Problem List   Diagnosis Date Noted  . Hyperlipidemia 11/03/2016  . Left-sided weakness 11/03/2016  . Dementia 11/03/2016  . 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 TIA (transient ischemic attack) 11/02/2016  . Bronchiectasis without acute exacerbation (HCC) 10/17/2013  . Other convulsions 09/19/2013  . Encounter for long-term (current) use of other medications 09/19/2013  . Hypercalcemia 09/19/2013  . Atrial fibrillation (HCC) 08/14/2013  . Congestive heart failure, unspecified 08/14/2013  . Hypopotassemia 08/14/2013  . Seizure disorder (HCC) 07/14/2013  . Schizophrenia (HCC) 07/14/2013  . HTN (hypertension) 07/14/2013  . Sepsis (HCC) 07/14/2013  . UTI (urinary tract infection) 07/14/2013  . Altered mental status 04/01/2013  . Anemia 03/31/2013  . Status epilepticus (HCC) 03/28/2013  . Acute respiratory failure (HCC) 03/28/2013  . DM (diabetes mellitus) (HCC) 03/28/2013    Orientation RESPIRATION BLADDER Height & Weight     Self  Normal Incontinent Weight: 150 lb 4.8 oz (68.2 kg) Height:  5\' 5"  (165.1 cm)  BEHAVIORAL SYMPTOMS/MOOD NEUROLOGICAL BOWEL NUTRITION STATUS      Continent Diet  AMBULATORY STATUS COMMUNICATION OF NEEDS Skin   Extensive Assist (Wheelchair bound) Verbally Normal                        Personal Care Assistance Level of Assistance  Bathing, Feeding, Dressing Bathing Assistance: Limited assistance Feeding assistance: Independent Dressing Assistance: Limited assistance     Functional Limitations Info  Sight, Hearing, Speech Sight Info: Adequate Hearing Info: Adequate Speech Info: Adequate    SPECIAL CARE FACTORS FREQUENCY       Contractures Contractures Info: Not present    Additional Factors Info  Code Status, Allergies, Psychotropic, Insulin Sliding Scale Code Status Info: Full Code Allergies Info: Depakote Valproic Acid, Haldol Haloperidol Lactate, Penicillins, Shellfish Allergy, Trileptal Oxcarbazepine Psychotropic Info: Medications:   Insulin Sliding Scale Info: 3x/day       Discharge Medications: Please see discharge summary for a list of discharge medications. DISCHARGE MEDICATIONS:   Current Discharge Medication List        START taking these medications   Details  atorvastatin (LIPITOR) 40 MG tablet Take 1 tablet (40 mg total) by mouth daily at 6 PM. Qty: 30 tablet, Refills: 5    mupirocin ointment (BACTROBAN) 2 % Apply to affected area 3 times daily Qty: 22 g, Refills: 0          CONTINUE these medications which have NOT CHANGED   Details  acetaminophen (TYLENOL) 500 MG tablet Take 500 mg by mouth every 6 (six) hours as needed for pain.    aspirin EC 81 MG tablet Take 81 mg by mouth daily.    B Complex-C (B-COMPLEX WITH VITAMIN C) tablet Take 1 tablet by mouth daily.    benztropine (COGENTIN) 0.5 MG tablet Take  0.5 mg by mouth 2 (two) times daily.    calcium carbonate (TUMS - DOSED IN MG ELEMENTAL CALCIUM) 500 MG chewable tablet Chew 1 tablet by mouth 2 (two) times daily.    clonazePAM (KLONOPIN) 0.5 MG tablet Take one tablet by mouth twice daily as needed for anxiety Qty: 60 tablet, Refills: 5    cyclobenzaprine (FLEXERIL) 5 MG tablet Take 5 mg by mouth 2 (two) times daily as needed for muscle spasms.     docusate sodium (COLACE) 100 MG capsule Take 100 mg by mouth 2 (two) times daily.    fluPHENAZine (PROLIXIN) 5 MG tablet Take 5 mg by mouth 3 (three) times daily.     fluPHENAZine decanoate (PROLIXIN) 25 MG/ML injection Inject into the muscle every 14 (fourteen) days.    lacosamide (VIMPAT) 200 MG TABS tablet Take one tablet by mouth twice daily Qty: 60 tablet, Refills: 5    lamoTRIgine (LAMICTAL) 200 MG tablet Take 200 mg by mouth 2 (two) times daily.     levETIRAcetam (KEPPRA) 1000 MG tablet Take 1 tablet (1,000 mg total) by mouth 2 (two) times daily.    magnesium oxide (MAG-OX) 400 MG tablet Take 400 mg by mouth daily.    metoprolol (LOPRESSOR) 50 MG tablet Take 50 mg by mouth daily.     omeprazole (PRILOSEC) 20 MG capsule Take 20 mg by mouth daily.    oxybutynin (DITROPAN-XL) 5 MG 24 hr tablet Take 5 mg by mouth 2 (two) times daily.    phenytoin (DILANTIN) 100 MG ER capsule Take 100 mg by mouth 2 (two) times daily.     thiamine (VITAMIN B-1) 100 MG tablet Take 100 mg by mouth daily.         STOP taking these medications     rivaroxaban (XARELTO) 20 MG TABS tablet      insulin aspart (NOVOLOG FLEXPEN) 100 UNIT/ML SOPN FlexPen      warfarin (COUMADIN) 10 MG tablet        Relevant Imaging Results:  Relevant Lab Results:   Additional Information SSN:  [id number]  Dede Query, LCSW

## 2016-11-03 NOTE — Progress Notes (Signed)
Called EMS for transport. 

## 2016-11-03 NOTE — Progress Notes (Signed)
[facility] - [place] [facility]   PATIENT NAME: [name]    MR#:  [id number]  DATE OF BIRTH:  [birth year]  SUBJECTIVE:  CHIEF COMPLAINT:   Chief Complaint  Patient presents with  . Fall  the patient is a 71 year oldfemale with past medical history significant for history of schizophrenia, seizure disorder, diabetes, CHF, who presents from group home after she was found to be on the floor, she apparently had a witnessed fall from the wheelchair. She was noted this to have facial droop, left-sided weakness at the group home and was sent to emergency room for further evaluation. In the hospital. However, she complained of right-sided weakness. She was seen by Dr. Thad Ranger in emergency room. She was admitted. She refuses all radiologic studies. Cardiac is consulted to discuss with the guardian, the goals of care. Patient admits of some right-sided weakness, states that she wants to go home.  Review of Systems  Unable to perform ROS: Dementia  Neurological: Positive for focal weakness.    VITAL SIGNS: Blood pressure 128/63, pulse 75, temperature 98.6 F (37 C), temperature source Oral, resp. rate 18, height 5\' 5"  (1.651 m), weight 68.2 kg (150 lb 4.8 oz), SpO2 98 %.  PHYSICAL EXAMINATION:   GENERAL:  71 y.o.-year-old patient lying in the bed with no acute distress. Shallow left labial crease EYES: Pupils equal, round, reactive to light and accommodation. No scleral icterus. Extraocular muscles intact.  HEENT: Head atraumatic, normocephalic. Oropharynx and nasopharynx clear.  NECK:  Supple, no jugular venous distention. No thyroid enlargement, no tenderness.  LUNGS: Normal breath sounds bilaterally, no wheezing, rales,rhonchi or crepitation. No use of accessory muscles of respiration.  CARDIOVASCULAR: S1, S2 normal. No murmurs, rubs, or gallops.  ABDOMEN: Soft, nontender, nondistended. Bowel sounds present. No organomegaly or mass.  EXTREMITIES: No pedal  edema, cyanosis, or clubbing.  NEUROLOGIC: Cranial nerves II through XII are difficult to examine, inconsistent effort, shallow left labial crease. Muscle strength 4/5 in all extremities. Sensationgrossly intact. Gait not checked.  PSYCHIATRIC: The patient is alert , not oriented  SKIN: No obvious rash, lesion, or ulcer.   ORDERS/RESULTS REVIEWED:   CBC  Recent Labs Lab 11/02/16 1830  WBC 14.0*  HGB 13.5  HCT 40.1  PLT 223  MCV 98.1  MCH 32.9  MCHC 33.5  RDW 14.2  LYMPHSABS 4.9*  MONOABS 1.3*  EOSABS 0.0  BASOSABS 0.1   ------------------------------------------------------------------------------------------------------------------  Chemistries   Recent Labs Lab 11/02/16 1830  NA 139  K 4.3  CL 107  CO2 24  GLUCOSE 127*  BUN 12  CREATININE 0.40*  CALCIUM 9.4  AST 27  ALT 19  ALKPHOS 95  BILITOT 0.6   ------------------------------------------------------------------------------------------------------------------ estimated creatinine clearance is 60.6 mL/min (by C-G formula based on SCr of 0.4 mg/dL (L)). ------------------------------------------------------------------------------------------------------------------ No results for input(s): TSH, T4TOTAL, T3FREE, THYROIDAB in the last 72 hours.  Invalid input(s): FREET3  Cardiac Enzymes  Recent Labs Lab 11/02/16 1830  TROPONINI <0.03   ------------------------------------------------------------------------------------------------------------------ Invalid input(s): POCBNP ---------------------------------------------------------------------------------------------------------------  RADIOLOGY: Ct Head Wo Contrast  Result Date: 11/02/2016 CLINICAL DATA:  Unwitnessed fall today. EXAM: CT HEAD WITHOUT CONTRAST TECHNIQUE: Contiguous axial images were obtained from the base of the skull through the vertex without intravenous contrast. COMPARISON:  12/11/2015 FINDINGS: Brain: There is no evidence for acute  hemorrhage, hydrocephalus, mass lesion, or abnormal extra-axial fluid collection. No definite CT evidence for acute infarction. Diffuse loss of parenchymal volume is consistent with atrophy. Patchy low attenuation in the deep hemispheric  and periventricular white matter is nonspecific, but likely reflects chronic microvascular ischemic demyelination. Vascular: No hyperdense vessel or unexpected calcification. Skull: No evidence for fracture. No worrisome lytic or sclerotic lesion. Sinuses/Orbits: The visualized paranasal sinuses and mastoid air cells are clear. Visualized portions of the globes and intraorbital fat are unremarkable. Other: Focal scalp contusion identified left frontal region. IMPRESSION: 1. Stable.  No acute intracranial abnormality. 2. Atrophy with mild chronic small vessel white matter ischemic disease. Electronically Signed   By: Kennith CenterEric  Mansell M.D.   On: 11/02/2016 13:26   [place] Carotid Bilateral  Result Date: 11/02/2016 CLINICAL DATA:  71 y/o F; cerebrovascular accident, unwitnessed fall today, weakness. EXAM: BILATERAL CAROTID DUPLEX ULTRASOUND TECHNIQUE: Wallace CullensGray scale imaging, color Doppler and duplex ultrasound were performed of bilateral carotid and vertebral arteries in the neck. COMPARISON:  None. FINDINGS: Criteria: Quantification of carotid stenosis is based on velocity parameters that correlate the residual internal carotid diameter with NASCET-based stenosis levels, using the diameter of the distal internal carotid lumen as the denominator for stenosis measurement. The following velocity measurements were obtained: RIGHT ICA:  111 cm/sec CCA:  69 cm/sec SYSTOLIC ICA/CCA RATIO:  1.6 DIASTOLIC ICA/CCA RATIO:  3.0 ECA:  70 for cm/sec LEFT ICA:  66 cm/sec CCA:  67 cm/sec SYSTOLIC ICA/CCA RATIO:  1.0 DIASTOLIC ICA/CCA RATIO:  1.8 ECA:  71 cm/sec RIGHT CAROTID ARTERY: Intimal thickening and calcified plaque at the bifurcation. RIGHT VERTEBRAL ARTERY:  Antegrade flow. LEFT CAROTID ARTERY:  Intimal thickening and calcified plaque at the bifurcation. LEFT VERTEBRAL ARTERY:  Antegrade flow. IMPRESSION: Less than 50% bilateral ICA stenosis, right greater than left, with calcified plaque and intimal thickening. Electronically Signed   By: Mitzi HansenLance  Furusawa-Stratton M.D.   On: 11/02/2016 19:06    EKG:  Orders placed or performed during the hospital encounter of 11/02/16  . EKG 12-Lead  . 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 Hyperlipidemia, initiate patient on Lipitor #4. History of seizure disorder, no seizures were noted during her hospitalization, continue outpatient medications   Management plans discussed with the patient, family and they are in agreement.   DRUG ALLERGIES:  Allergies  Allergen Reactions  . Depakote [Valproic Acid] Other (See Comments)    unknown  . Haldol [Haloperidol Lactate] Other (See Comments)    Unknown   . Penicillins Other (See Comments)    Unknown   . Shellfish Allergy Other (See Comments)    unknown  . Trileptal [Oxcarbazepine] Other (See Comments)    unknown    CODE STATUS:     Code Status Orders        Start     Ordered   11/02/16 1706  Full code  Continuous     11/02/16 1708    Code Status History    Date Active Date Inactive Code Status Order ID Comments User Context   07/05/2013  8:18 PM 07/16/2013  6:13 PM Full Code [id number]  Latrelle DodrillBrittany J McIntyre, MD Inpatient   03/28/2013  4:33 AM 04/02/2013  9:08 PM Full Code [id number]  Lonia FarberKonstantin Zubelevitskiy, MD ED    Advance Directive Documentation   Flowsheet Row Most Recent Value  Type of Advance Directive  Living will  Pre-existing out of facility DNR order (yellow form or pink MOST form)  No data  "MOST" Form in Place?  No data      TOTAL TIME TAKING CARE OF THIS PATIENT: 40 minutes.    Katharina Caper M.D on 11/03/2016 at 2:14 PM  Between 7am to 6pm - Pager - [phone]  After 6pm go to [email or web address] - password [email or web address] [facility]  [facility]  Office  [phone]  CC: Primary care physician; Lorenda Ishihara, MD

## 2016-11-03 NOTE — Progress Notes (Signed)
*  PRELIMINARY RESULTS* Echocardiogram 2D Echocardiogram has been performed.  [name] 11/03/2016, 1:28 PM 

## 2016-11-03 NOTE — Discharge Summary (Addendum)
[facility] [place] at [facility]   PATIENT NAME: [name]    MR#:  [id number]  DATE OF BIRTH:  March 29, 1946  DATE OF ADMISSION:  11/02/2016 ADMITTING PHYSICIAN: Milagros Loll, MD  DATE OF DISCHARGE: No discharge date for patient encounter.  PRIMARY CARE PHYSICIAN: Lorenda Ishihara, MD     ADMISSION DIAGNOSIS:  CVA (cerebral vascular accident) (HCC) [I63.9] Abrasion [T14.8XXA] Right arm weakness [R29.898] Contusion of scalp, initial encounter [S00.03XA] Fall, initial encounter [W19.XXXA]  DISCHARGE DIAGNOSIS:  Principal Problem:   TIA (transient ischemic attack) Active Problems:   Left-sided weakness   Hyperlipidemia   Dementia   MRSA carrier   SECONDARY DIAGNOSIS:   Past Medical History:  Diagnosis Date  . Asthma   . CHF (congestive heart failure) (HCC)   . Diabetes mellitus without complication (HCC)   . Hepatitis A   . Hepatitis B   . Hepatitis C   . Hypertension   . Respiratory failure (HCC)   . Schizophrenia (HCC)   . Seizures (HCC)   . Smoker    1 pack daily    .pro HOSPITAL COURSE:  The patient is a 71 year oldfemale with past medical history significant for history of schizophrenia, seizure disorder, diabetes, CHF, who presents from group home after she was found to be on the floor, she apparently had a witnessed fall from the wheelchair. She was noted this to have facial droop, left-sided weakness at the group home and was sent to emergency room for further evaluation. In the hospital. However, she complained of right-sided weakness. She was seen by Dr. Thad Ranger in emergency room. She was admitted. She refuses radiologic workup. Palliative care was consulted to discuss goals of care with the guardian, which was discussed with DSS guardian. Guardian requested to discuss this with the patient and then submit the recommendations for DSS for consideration. Patient was evaluated by neurologist, and recommended no further  investigation, if patient refuses to undergo one. Physical therapist evaluated patient and felt that she is was a baseline. Patient was felt to be stable to be discharged back to the group home today. . Discussion by problem: #75fall with left-sided weakness of unclear etiology, . MRI and MRA of brain showed no stroke, questionable TIA, rule out recurrent seizures, patient was seen by neurologist, recommended continued therapy for possible TIA with aspirin, Xarelto, Lipitor. Lipid panel was performed in the hospital and was found to be abnormal, total cholesterol was found to be 211, HDL 82, LDL 114. Patient was initiated on Lipitor. Dilantin level is pending. . It may be prudent to hold her Xarelto for the next few days while hematoma resolves #2dementia due to psychiatric and neurologic illnesses, supportive therapy, . It would be beneficial if palliative care  followed patient as outpatient discussing  goals of care with the guardian, DSS. [name] Patient . Initially refused radiologic workup, however, find that he was able to get MRI and MRA of the brain, showing no stroke, unremarkable intracranial blood flow,left forehead hematoma, cervical spinal stenosis. C3-C4 #3. Hyperlipidemia, initiate patient on Lipitor #4. History of seizure disorder, no seizures were noted during her hospitalization, continue outpatient medications, Dilantin level is pending, discussed with neurologist . #5. MRSA carrier, . Bactroban is ordered intranasally, to be administered for 5 days DISCHARGE CONDITIONS:   . Stable  CONSULTS OBTAINED:  Treatment Team:  Kym Groom, MD Thana Farr, MD  DRUG ALLERGIES:   Allergies  Allergen Reactions  . Depakote [Valproic Acid] Other (See Comments)  unknown  . Haldol [Haloperidol Lactate] Other (See Comments)    Unknown   . Penicillins Other (See Comments)    Unknown   . Shellfish Allergy Other (See Comments)    unknown  . Trileptal [Oxcarbazepine] Other (See  Comments)    unknown    DISCHARGE MEDICATIONS:   Current Discharge Medication List    START taking these medications   Details  atorvastatin (LIPITOR) 40 MG tablet Take 1 tablet (40 mg total) by mouth daily at 6 PM. Qty: 30 tablet, Refills: 5    mupirocin ointment (BACTROBAN) 2 % Apply to affected area 3 times daily Qty: 22 g, Refills: 0      CONTINUE these medications which have NOT CHANGED   Details  acetaminophen (TYLENOL) 500 MG tablet Take 500 mg by mouth every 6 (six) hours as needed for pain.    aspirin EC 81 MG tablet Take 81 mg by mouth daily.    B Complex-C (B-COMPLEX WITH VITAMIN C) tablet Take 1 tablet by mouth daily.    benztropine (COGENTIN) 0.5 MG tablet Take 0.5 mg by mouth 2 (two) times daily.    calcium carbonate (TUMS - DOSED IN MG ELEMENTAL CALCIUM) 500 MG chewable tablet Chew 1 tablet by mouth 2 (two) times daily.    clonazePAM (KLONOPIN) 0.5 MG tablet Take one tablet by mouth twice daily as needed for anxiety Qty: 60 tablet, Refills: 5    cyclobenzaprine (FLEXERIL) 5 MG tablet Take 5 mg by mouth 2 (two) times daily as needed for muscle spasms.    docusate sodium (COLACE) 100 MG capsule Take 100 mg by mouth 2 (two) times daily.    fluPHENAZine (PROLIXIN) 5 MG tablet Take 5 mg by mouth 3 (three) times daily.     fluPHENAZine decanoate (PROLIXIN) 25 MG/ML injection Inject into the muscle every 14 (fourteen) days.    lacosamide (VIMPAT) 200 MG TABS tablet Take one tablet by mouth twice daily Qty: 60 tablet, Refills: 5    lamoTRIgine (LAMICTAL) 200 MG tablet Take 200 mg by mouth 2 (two) times daily.     levETIRAcetam (KEPPRA) 1000 MG tablet Take 1 tablet (1,000 mg total) by mouth 2 (two) times daily.    magnesium oxide (MAG-OX) 400 MG tablet Take 400 mg by mouth daily.    metoprolol (LOPRESSOR) 50 MG tablet Take 50 mg by mouth daily.     omeprazole (PRILOSEC) 20 MG capsule Take 20 mg by mouth daily.    oxybutynin (DITROPAN-XL) 5 MG 24 hr tablet  Take 5 mg by mouth 2 (two) times daily.    phenytoin (DILANTIN) 100 MG ER capsule Take 100 mg by mouth 2 (two) times daily.     thiamine (VITAMIN B-1) 100 MG tablet Take 100 mg by mouth daily.      STOP taking these medications     rivaroxaban (XARELTO) 20 MG TABS tablet      insulin aspart (NOVOLOG FLEXPEN) 100 UNIT/ML SOPN FlexPen      warfarin (COUMADIN) 10 MG tablet          DISCHARGE INSTRUCTIONS:    . The patient is to follow-up with primary care physician within one week after discharge  If you experience worsening of your admission symptoms, develop shortness of breath, life threatening emergency, suicidal or homicidal thoughts you must seek medical attention immediately by calling 911 or calling your MD immediately  if symptoms less severe.  You Must read complete instructions/literature along with all the possible adverse reactions/side effects for all  the Medicines you take and that have been prescribed to you. Take any new Medicines after you have completely understood and accept all the possible adverse reactions/side effects.   Please note  You were cared for by a hospitalist during your hospital stay. If you have any questions about your discharge medications or the care you received while you were in the hospital after you are discharged, you can call the unit and asked to speak with the hospitalist on call if the hospitalist that took care of you is not available. Once you are discharged, your primary care physician will handle any further medical issues. Please note that NO REFILLS for any discharge medications will be authorized once you are discharged, as it is imperative that you return to your primary care physician (or establish a relationship with a primary care physician if you do not have one) for your aftercare needs so that they can reassess your need for medications and monitor your lab values.    Today   CHIEF COMPLAINT:   Chief Complaint  Patient  presents with  . Fall    HISTORY OF PRESENT ILLNESS:  Solon PalmBeverly Frohlich  is a 71 y.o. female with a known history of schizophrenia, seizure disorder, diabetes, CHF, who presents from group home after she was found to be on the floor, she apparently had a witnessed fall from the wheelchair. She was noted this to have facial droop, left-sided weakness at the group home and was sent to emergency room for further evaluation. In the hospital. However, she complained of right-sided weakness. She was seen by Dr. Thad Rangereynolds in emergency room. She was admitted. She refuses radiologic workup. Palliative care was consulted to discuss goals of care with the guardian, which was discussed with DSS guardian. Guardian requested to discuss this with the patient and then submit the recommendations for DSS for consideration. Patient was evaluated by neurologist, and recommended no further investigation, if patient refuses to undergo one. Physical therapist evaluated patient and felt that she is was a baseline. Patient was felt to be stable to be discharged back to the group home today. . Discussion by problem: #511fall with left-sided weakness of unclear etiology, . MRI and MRA of brain showed no stroke, questionable TIA, rule out recurrent seizures, patient was seen by neurologist, recommended continued therapy for possible TIA with aspirin, Xarelto, Lipitor. Lipid panel was performed in the hospital and was found to be abnormal, total cholesterol was found to be 211, HDL 82, LDL 114. Patient was initiated on Lipitor. Dilantin level is pending. . It may be prudent to hold her Xarelto for the next few days while hematoma resolves #2dementia due to psychiatric and neurologic illnesses, supportive therapy, . It would be beneficial if palliative care  followed patient as outpatient discussing  goals of care with the guardian, DSS. [name]. Patient . Initially refused radiologic workup, however, find that he was able to get MRI and MRA of the  brain, showing no stroke, unremarkable intracranial blood flow,left forehead hematoma, cervical spinal stenosis. C3-C4 #3. Hyperlipidemia, initiate patient on Lipitor #4. History of seizure disorder, no seizures were noted during her hospitalization, continue outpatient medications, Dilantin level is pending, discussed with neurologist . #5. MRSA carrier, . Bactroban is ordered intranasally, to be administered for 5 days    VITAL SIGNS:  Blood pressure 128/63, pulse 75, temperature 98.6 F (37 C), temperature source Oral, resp. rate 18, height 5\' 5"  (1.651 m), weight 68.2 kg (150 lb 4.8 oz), SpO2 98 %.  I/O:  No intake or output data in the 24 hours ending 11/03/16 1604  PHYSICAL EXAMINATION:  GENERAL:  71 y.o.-year-old patient lying in the bed with no acute distress.  EYES: Pupils equal, round, reactive to light and accommodation. No scleral icterus. Extraocular muscles intact.  HEENT: Head atraumatic, normocephalic. Oropharynx and nasopharynx clear.  NECK:  Supple, no jugular venous distention. No thyroid enlargement, no tenderness.  LUNGS: Normal breath sounds bilaterally, no wheezing, rales,rhonchi or crepitation. No use of accessory muscles of respiration.  CARDIOVASCULAR: S1, S2 normal. No murmurs, rubs, or gallops.  ABDOMEN: Soft, non-tender, non-distended. Bowel sounds present. No organomegaly or mass.  EXTREMITIES: No pedal edema, cyanosis, or clubbing.  NEUROLOGIC: Cranial nerves II through XII are intact. Muscle strength 5/5 in all extremities. Sensation intact. Gait not checked.  PSYCHIATRIC: The patient is alert and oriented x 3.  SKIN: No obvious rash, lesion, or ulcer.   DATA REVIEW:   CBC  Recent Labs Lab 11/02/16 1830  WBC 14.0*  HGB 13.5  HCT 40.1  PLT 223    Chemistries   Recent Labs Lab 11/02/16 1830  NA 139  K 4.3  CL 107  CO2 24  GLUCOSE 127*  BUN 12  CREATININE 0.40*  CALCIUM 9.4  AST 27  ALT 19  ALKPHOS 95  BILITOT 0.6    Cardiac  Enzymes  Recent Labs Lab 11/02/16 1830  TROPONINI <0.03    Microbiology Results  Results for orders placed or performed during the hospital encounter of 11/02/16  MRSA PCR Screening     Status: Abnormal   Collection Time: 11/02/16 10:45 PM  Result Value Ref Range Status   MRSA by PCR POSITIVE (A) NEGATIVE Final    Comment:        The GeneXpert MRSA Assay (FDA approved for NASAL specimens only), is one component of a comprehensive MRSA colonization surveillance program. It is not intended to diagnose MRSA infection nor to guide or monitor treatment for MRSA infections. RESULT CALLED TO, READ BACK BY AND VERIFIED WITH: SYLVIA FUENTES ON 11/03/16 AT 0111 BY TLB     RADIOLOGY:  Ct Head Wo Contrast  Result Date: 11/02/2016 CLINICAL DATA:  Unwitnessed fall today. EXAM: CT HEAD WITHOUT CONTRAST TECHNIQUE: Contiguous axial images were obtained from the base of the skull through the vertex without intravenous contrast. COMPARISON:  12/11/2015 FINDINGS: Brain: There is no evidence for acute hemorrhage, hydrocephalus, mass lesion, or abnormal extra-axial fluid collection. No definite CT evidence for acute infarction. Diffuse loss of parenchymal volume is consistent with atrophy. Patchy low attenuation in the deep hemispheric and periventricular white matter is nonspecific, but likely reflects chronic microvascular ischemic demyelination. Vascular: No hyperdense vessel or unexpected calcification. Skull: No evidence for fracture. No worrisome lytic or sclerotic lesion. Sinuses/Orbits: The visualized paranasal sinuses and mastoid air cells are clear. Visualized portions of the globes and intraorbital fat are unremarkable. Other: Focal scalp contusion identified left frontal region. IMPRESSION: 1. Stable.  No acute intracranial abnormality. 2. Atrophy with mild chronic small vessel white matter ischemic disease. Electronically Signed   By: Kennith Center M.D.   On: 11/02/2016 13:26   Mr Maxine Glenn Head Wo  Contrast  Result Date: 11/03/2016 CLINICAL DATA:  71 year old female found down, unwitnessed fall from wheelchair. Acute facial droop and left side weakness noted. Initial encounter. EXAM: MRI HEAD WITHOUT CONTRAST MRA HEAD WITHOUT CONTRAST TECHNIQUE: Multiplanar, multiecho pulse sequences of the brain and surrounding structures were obtained without intravenous contrast. Angiographic images of the head were  obtained using MRA technique without contrast. COMPARISON:  Head CT without contrast 11/02/2016 and earlier. FINDINGS: MRI HEAD FINDINGS Brain: No restricted diffusion to suggest acute infarction. No midline shift, mass effect, evidence of mass lesion, ventriculomegaly, extra-axial collection or acute intracranial hemorrhage. Cervicomedullary junction and pituitary are within normal limits. No cortical encephalomalacia or chronic cerebral blood products identified. Mild for age nonspecific mostly periventricular cerebral white matter T2 and FLAIR hyperintensity. Vascular: Major intracranial vascular flow voids are preserved. Skull and upper cervical spine: Chronic degenerative spinal stenosis at C3-C4 appears stable since 09/26/2014 cervical spine CT. Normal bone marrow signal. Hyperostosis of the calvarium (normal variant). Sinuses/Orbits: Negative orbit soft tissues. Visualized paranasal sinuses and mastoids are stable and well pneumatized. Other: Broad-based left forehead scalp hematoma (series 15, image 17). Otherwise negative scalp soft tissues. MRA HEAD FINDINGS Study is mildly degraded by motion artifact despite repeated imaging attempts. Diminutive vertebrobasilar system on the basis of fetal type PCA origins. Dominant distal left vertebral artery is patent to the basilar. Smaller distal right vertebral artery appears to functionally terminates in PICA. Left PICA origin is normal. The basilar is diminutive but remains patent. Normal SCA origins. Fetal type bilateral PCA origins. Bilateral PCA branches  are within normal limits. Antegrade flow in both ICA siphons. No siphon stenosis. Normal ophthalmic and posterior communicating artery origins. Normal carotid termini, MCA and ACA origins. Anterior communicating artery and visualized ACA branches are within normal limits. Bilateral MCA M1 segments, MCA bifurcations, and visualized bilateral MCA branches are within normal limits. IMPRESSION: 1. No acute intracranial abnormality and largely unremarkable for age noncontrast MRI appearance of the brain. 2.  Negative intracranial MRA. 3. Broad-based left forehead scalp hematoma. 4. Chronic degenerative cervical spinal stenosis at C3-C4. Electronically Signed   By: Odessa Fleming M.D.   On: 11/03/2016 14:25   Mr Brain Wo Contrast  Result Date: 11/03/2016 CLINICAL DATA:  71 year old female found down, unwitnessed fall from wheelchair. Acute facial droop and left side weakness noted. Initial encounter. EXAM: MRI HEAD WITHOUT CONTRAST MRA HEAD WITHOUT CONTRAST TECHNIQUE: Multiplanar, multiecho pulse sequences of the brain and surrounding structures were obtained without intravenous contrast. Angiographic images of the head were obtained using MRA technique without contrast. COMPARISON:  Head CT without contrast 11/02/2016 and earlier. FINDINGS: MRI HEAD FINDINGS Brain: No restricted diffusion to suggest acute infarction. No midline shift, mass effect, evidence of mass lesion, ventriculomegaly, extra-axial collection or acute intracranial hemorrhage. Cervicomedullary junction and pituitary are within normal limits. No cortical encephalomalacia or chronic cerebral blood products identified. Mild for age nonspecific mostly periventricular cerebral white matter T2 and FLAIR hyperintensity. Vascular: Major intracranial vascular flow voids are preserved. Skull and upper cervical spine: Chronic degenerative spinal stenosis at C3-C4 appears stable since 09/26/2014 cervical spine CT. Normal bone marrow signal. Hyperostosis of the  calvarium (normal variant). Sinuses/Orbits: Negative orbit soft tissues. Visualized paranasal sinuses and mastoids are stable and well pneumatized. Other: Broad-based left forehead scalp hematoma (series 15, image 17). Otherwise negative scalp soft tissues. MRA HEAD FINDINGS Study is mildly degraded by motion artifact despite repeated imaging attempts. Diminutive vertebrobasilar system on the basis of fetal type PCA origins. Dominant distal left vertebral artery is patent to the basilar. Smaller distal right vertebral artery appears to functionally terminates in PICA. Left PICA origin is normal. The basilar is diminutive but remains patent. Normal SCA origins. Fetal type bilateral PCA origins. Bilateral PCA branches are within normal limits. Antegrade flow in both ICA siphons. No siphon stenosis. Normal ophthalmic and posterior  communicating artery origins. Normal carotid termini, MCA and ACA origins. Anterior communicating artery and visualized ACA branches are within normal limits. Bilateral MCA M1 segments, MCA bifurcations, and visualized bilateral MCA branches are within normal limits. IMPRESSION: 1. No acute intracranial abnormality and largely unremarkable for age noncontrast MRI appearance of the brain. 2.  Negative intracranial MRA. 3. Broad-based left forehead scalp hematoma. 4. Chronic degenerative cervical spinal stenosis at C3-C4. Electronically Signed   By: Odessa Fleming M.D.   On: 11/03/2016 14:25   US Carotid Bilateral  Result Date: 11/02/2016 CLINICAL DATA:  71 y/o F; cerebrovascular accident, unwitnessed fall today, weakness. EXAM: BILATERAL CAROTID DUPLEX ULTRASOUND TECHNIQUE: Wallace Cullens scale imaging, color Doppler and duplex ultrasound were performed of bilateral carotid and vertebral arteries in the neck. COMPARISON:  None. FINDINGS: Criteria: Quantification of carotid stenosis is based on velocity parameters that correlate the residual internal carotid diameter with NASCET-based stenosis levels, using  the diameter of the distal internal carotid lumen as the denominator for stenosis measurement. The following velocity measurements were obtained: RIGHT ICA:  111 cm/sec CCA:  69 cm/sec SYSTOLIC ICA/CCA RATIO:  1.6 DIASTOLIC ICA/CCA RATIO:  3.0 ECA:  70 for cm/sec LEFT ICA:  66 cm/sec CCA:  67 cm/sec SYSTOLIC ICA/CCA RATIO:  1.0 DIASTOLIC ICA/CCA RATIO:  1.8 ECA:  71 cm/sec RIGHT CAROTID ARTERY: Intimal thickening and calcified plaque at the bifurcation. RIGHT VERTEBRAL ARTERY:  Antegrade flow. LEFT CAROTID ARTERY: Intimal thickening and calcified plaque at the bifurcation. LEFT VERTEBRAL ARTERY:  Antegrade flow. IMPRESSION: Less than 50% bilateral ICA stenosis, right greater than left, with calcified plaque and intimal thickening. Electronically Signed   By: Mitzi Hansen M.D.   On: 11/02/2016 19:06    EKG:   Orders placed or performed during the hospital encounter of 11/02/16  . EKG 12-Lead  . EKG 12-Lead      Management plans discussed with the patient, family and they are in agreement.  CODE STATUS:     Code Status Orders        Start     Ordered   11/02/16 1706  Full code  Continuous     11/02/16 1708    Code Status History    Date Active Date Inactive Code Status Order ID Comments User Context   07/05/2013  8:18 PM 07/16/2013  6:13 PM Full Code [id number]  [name], MD Inpatient   03/28/2013  4:33 AM 04/02/2013  9:08 PM Full Code [id number]  Lonia Farber, MD ED    Advance Directive Documentation   Flowsheet Row Most Recent Value  Type of Advance Directive  Living will  Pre-existing out of facility DNR order (yellow form or pink MOST form)  No data  "MOST" Form in Place?  No data      TOTAL TIME TAKING CARE OF THIS PATIENT: . 40 minutes.    Katharina Caper M.D on 11/03/2016 at 4:04 PM  Between 7am to 6pm - Pager - [phone]  After 6pm go to [email or web address] - password [email or web address] [facility]  [facility]  Office  [phone]  CC: Primary  care physician; Lorenda Ishihara, MD
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
## 2016-11-03 NOTE — Progress Notes (Signed)
*  PRELIMINARY RESULTS* Echocardiogram 2D Echocardiogram has been performed.  Cristela BlueHege, Darvis Croft 11/03/2016, 1:28 PM

## 2016-11-03 NOTE — Progress Notes (Signed)
No charge note:   Palliative consult received. Discussed patient's care briefly with DSS guardian Saintclair HalstedRhesha Carr. Patient has son in [place] who is not involved in care. Guardian requested GOC discussion first with patient and then submit recommendations to DSS for considerations. Patient in MRI when I attempted to evaluate. Will have full consult later today or tomorrow.  Ocie BobKasie Ridhima Golberg, AGNP-C Palliative Medicine  Please call Palliative Medicine team phone with any questions [phone]. For individual providers please see AMION.

## 2016-11-03 NOTE — Evaluation (Signed)
Occupational Therapy Evaluation Patient Details Name: [name] MRN: [id number] DOB: [birth year] Today's Date: 11/03/2016    History of Present Illness Pt presented to ED with c/o R and L sided weakness s/p falling out of w/c. Pt and pt's social worker Meredith Staggers) state pt has been w/c bound for years and has assistance at [facility] (ALF).   Clinical Impression   Pt seen for OT evaluation this date. Pt presents with pain in R hand at IV site, decreased strength/AROM/activity tolerance and impaired cognition with need for skilled OT services to address noted impairments and functional deficits in order to maximize functional independence. Difficult to determine PLOF and baseline cognition during assessment. Pt is a good candidate for STR however, consider HHOT if appropriate assistance is available at group home.     Follow Up Recommendations  SNF    Equipment Recommendations  None recommended by OT    Recommendations for Other Services       Precautions / Restrictions Precautions Precautions: Fall Restrictions Weight Bearing Restrictions: No      Mobility Bed Mobility Overal bed mobility: Needs Assistance Bed Mobility:  (scooting up in bed)      General bed mobility comments: pt required bed positioning and mod assist to scoot up in bed, pt did not want to get out of bed  Transfers Overall transfer level: Needs assistance Equipment used: None (pt refused to trial standing, as she states she's w/c bound)             General transfer comment: not attempted, pt declined out of bed    Balance                                  ADL Overall ADL's : At baseline;Needs assistance/impaired Eating/Feeding: Minimal assistance;Sitting Eating/Feeding Details (indicate cue type and reason): performed self feeding with non-dom hand (reported unable to use R hand due to IV site) with moderate spillage of jello initially, requiring min assist to complete due  to pt reported fatigue Grooming: Set up;Bed level                     Toilet Transfer Details (indicate cue type and reason): did not assess due to pt safety         Functional mobility during ADLs: Wheelchair General ADL Comments: Per pt report and based on limited ability to assess secondary to pt decline, pt reports being at baseline with functioning, was able to initiate doffing sock at bed level with HOB elevated but declined further attempts     Vision Baseline Vision/History:  (unable to determine baseline) Patient Visual Report: No change from baseline Vision Assessment?: No apparent visual deficits     Perception     Praxis      Pertinent Vitals/Pain Pain Assessment: Faces Pain Score:  (pt unable to rate reports her R hand hurts at IV site) Faces Pain Scale: Hurts little more Pain Location: R hand - IV site Pain Descriptors / Indicators:  (sharp) Pain Intervention(s): Monitored during session     Hand Dominance Right   Extremity/Trunk Assessment Upper Extremity Assessment Upper Extremity Assessment: Generalized weakness RUE Deficits / Details: Pt performed AROM approx 50% of range, AAROM  WFL, 3+/5 grossly; pt reported difficulty raising arm due to IV site in R hand RUE Sensation:  (Pt reported tingling in R hand 2/2 IV site,light touch intac) LUE Deficits / Details:  L AROM WFL, declined MMT LUE Sensation:  (No c/o N/T and sensation intact)   Lower Extremity Assessment Lower Extremity Assessment: Defer to PT evaluation RLE Deficits / Details: Pt able to perform dorsilexion/plantarflexion and kicking/bending of knee but refused MMT. RLE Sensation:  (WNL) LLE Deficits / Details: Pt able to perform dorsiflexion/plantarflexion and kicking/bending of knee but refused MMT. LLE Sensation:  (WNL)       Communication Communication Communication: No difficulties   Cognition Arousal/Alertness: Awake/alert Behavior During Therapy: WFL for tasks  assessed/performed Overall Cognitive Status: No family/caregiver present to determine baseline cognitive functioning                 General Comments: Pt eager to return home, worried that she is being held here for no reason   General Comments       Exercises       Shoulder Instructions      Home Living Family/patient expects to be discharged to:: Group home                                        Prior Functioning/Environment Level of Independence: Needs assistance  Gait / Transfers Assistance Needed: Pt unable to amb. for years and uses w/c for all mobility ADL's / Homemaking Assistance Needed: Lives at [facility] (has assist per pt and social worker-Reshia)   Comments: unsure of amount of assist required for ADL, per pt report she is able to perform all dressing, toileting without physical assist but sometimes needs help with bathing        OT Problem List:        OT Treatment/Interventions:      OT Goals(Current goals can be found in the care plan section) Acute Rehab OT Goals Patient Stated Goal: "To go home" OT Goal Formulation: With patient Time For Goal Achievement: 11/17/16 Potential to Achieve Goals: Good  OT Frequency:     Barriers to D/C:            Co-evaluation              End of Session    Activity Tolerance: Other (comment) (limited by pain, fatigue, cognition (difficult to determine)) Patient left: in bed;with call bell/phone within reach;with bed alarm set  OT Visit Diagnosis: Muscle weakness (generalized) (M62.81);Pain;Other symptoms and signs involving cognitive function;Feeding difficulties (R63.3) Pain - Right/Left: Right Pain - part of body: Hand                ADL either performed or assessed with clinical judgement  Time: [id number] OT Time Calculation (min): 16 min Charges:  OT General Charges $OT Visit: 1 Procedure OT Evaluation $OT Eval Moderate Complexity: 1 Procedure OT Treatments $Self  Care/Home Management : 8-22 mins G-Codes: OT G-codes **NOT FOR INPATIENT CLASS** Functional Assessment Tool Used: AM-PAC 6 Clicks Daily Activity Functional Limitation: Self care Self Care Current Status ([id number]): At least 40 percent but less than 60 percent impaired, limited or restricted Self Care Goal Status ([id number](G8988): At least 40 percent but less than 60 percent impaired, limited or restricted Self Care Discharge Status [phone]): At least 40 percent but less than 60 percent impaired, limited or restricted   Richrd PrimeJamie Stiller, MPH, MS, OTR/L ascom [phone] 11/03/16, 4:15 PM

## 2016-11-03 NOTE — Clinical Social Work Note (Signed)
CSW consulted for "discharge planning." Pt was admitted from [facility]. CSW left a message with Caleen EssexResha Carr [phone]) requesting a return phone call. Full assessment to follow. CSW will continue to follow.   Dede QuerySarah Maicol Bowland, MSW, LCSW  Clinical Social Worker  [phone]

## 2016-11-03 NOTE — Plan of Care (Signed)
Problem: Education: Goal: Knowledge of [facility] Education information/materials will improve Outcome: Progressing Pt likes to be called [name]  Past Medical History:  Diagnosis Date  . Asthma   . CHF (congestive heart failure) (HCC)   . Diabetes mellitus without complication (HCC)   . Hepatitis A   . Hepatitis B   . Hepatitis C   . Hypertension   . Respiratory failure (HCC)   . Schizophrenia (HCC)   . Seizures (HCC)   . Smoker    1 pack daily   Pt is well controlled with home medications

## 2016-11-04 NOTE — Care Management (Addendum)
Telephone call to [name] at [facility]. Discussed the need to give [name] 300 mg orally once today in addition to usual doses. Orders faxed to [phone]. Also, will need blood draw in the morning for dilantin level. [name] gets physician care per [facility] per [place]. Orders and discharge summary [facility] to [facility].  [name] GreetBrenda S Minnette Merida RN MSN CCM Care Management

## 2017-08-21 ENCOUNTER — Other Ambulatory Visit: Payer: Self-pay | Admitting: Ophthalmology

## 2017-08-21 ENCOUNTER — Emergency Department
Admission: EM | Admit: 2017-08-21 | Discharge: 2017-08-21 | Disposition: A | Discharge status: Home / Self Care | Payer: Medicare Other | Attending: Emergency Medicine | Admitting: Emergency Medicine

## 2017-08-21 ENCOUNTER — Emergency Department: Payer: Medicare Other

## 2017-08-21 DIAGNOSIS — I509 Heart failure, unspecified: Secondary | ICD-10-CM | POA: Insufficient documentation

## 2017-08-21 DIAGNOSIS — F0390 Unspecified dementia without behavioral disturbance: Secondary | ICD-10-CM | POA: Diagnosis not present

## 2017-08-21 DIAGNOSIS — W050XXA Fall from non-moving wheelchair, initial encounter: Secondary | ICD-10-CM | POA: Diagnosis not present

## 2017-08-21 DIAGNOSIS — Z7982 Long term (current) use of aspirin: Secondary | ICD-10-CM | POA: Insufficient documentation

## 2017-08-21 DIAGNOSIS — E119 Type 2 diabetes mellitus without complications: Secondary | ICD-10-CM | POA: Insufficient documentation

## 2017-08-21 DIAGNOSIS — S01111A Laceration without foreign body of right eyelid and periocular area, initial encounter: Secondary | ICD-10-CM | POA: Insufficient documentation

## 2017-08-21 DIAGNOSIS — Y939 Activity, unspecified: Secondary | ICD-10-CM | POA: Diagnosis not present

## 2017-08-21 DIAGNOSIS — Z79899 Other long term (current) drug therapy: Secondary | ICD-10-CM | POA: Insufficient documentation

## 2017-08-21 DIAGNOSIS — Z8673 Personal history of transient ischemic attack (TIA), and cerebral infarction without residual deficits: Secondary | ICD-10-CM | POA: Insufficient documentation

## 2017-08-21 DIAGNOSIS — I110 Hypertensive heart disease with heart failure: Secondary | ICD-10-CM | POA: Diagnosis not present

## 2017-08-21 DIAGNOSIS — Z23 Encounter for immunization: Secondary | ICD-10-CM | POA: Insufficient documentation

## 2017-08-21 DIAGNOSIS — Y929 Unspecified place or not applicable: Secondary | ICD-10-CM | POA: Diagnosis not present

## 2017-08-21 DIAGNOSIS — F17210 Nicotine dependence, cigarettes, uncomplicated: Secondary | ICD-10-CM | POA: Diagnosis not present

## 2017-08-21 DIAGNOSIS — Y999 Unspecified external cause status: Secondary | ICD-10-CM | POA: Diagnosis not present

## 2017-08-21 DIAGNOSIS — S0181XA Laceration without foreign body of other part of head, initial encounter: Secondary | ICD-10-CM

## 2017-08-21 DIAGNOSIS — S0990XA Unspecified injury of head, initial encounter: Secondary | ICD-10-CM | POA: Diagnosis present

## 2017-08-21 MED ORDER — LIDOCAINE-EPINEPHRINE (PF) 1 %-1:200000 IJ SOLN
INTRAMUSCULAR | Status: AC
  Administered 2017-08-21: 30 mL
  Filled 2017-08-21: qty 30

## 2017-08-21 MED ORDER — BACITRACIN ZINC 500 UNIT/GM EX OINT: TOPICAL_OINTMENT | Freq: Two times a day (BID) | CUTANEOUS | Status: DC

## 2017-08-21 MED ORDER — LIDOCAINE-EPINEPHRINE (PF) 2 %-1:200000 IJ SOLN
10.0000 mL | Freq: Once | INTRAMUSCULAR | Status: AC
  Administered 2017-08-21: 18:00:00
  Filled 2017-08-21 (×2): qty 10

## 2017-08-21 MED ORDER — TETANUS-DIPHTH-ACELL PERTUSSIS 5-2.5-18.5 LF-MCG/0.5 IM SUSP
0.5000 mL | Freq: Once | INTRAMUSCULAR | Status: AC
  Administered 2017-08-21: 0.5 mL via INTRAMUSCULAR
  Filled 2017-08-21: qty 0.5

## 2017-08-21 MED ORDER — METOPROLOL TARTRATE 50 MG PO TABS: 50.0000 mg | ORAL_TABLET | Freq: Once | ORAL | Status: DC

## 2017-08-21 MED ORDER — BACITRACIN ZINC 500 UNIT/GM EX OINT
TOPICAL_OINTMENT | Freq: Four times a day (QID) | CUTANEOUS | Status: DC
  Administered 2017-08-21: 1 via TOPICAL
  Filled 2017-08-21: qty 0.9

## 2017-08-21 NOTE — Discharge Instructions (Addendum)
keep area clean and dry. May wash in the shower with warm soapy water gentlydo not scrub the area pat dry. return for any signs of infection increased swelling pain possibly redness.apply bacitracin to the wound 3 times a day for a week

## 2017-08-21 NOTE — ED Notes (Signed)
Called Legal guardian, Adela LankRheshe Carr and given status, report also called to [facility], [place]. Facility unable to provide transport. Will call EMS for transport.

## 2017-08-21 NOTE — ED Notes (Addendum)
Pt has a legal guardian and is unable to sign e-signature. Facility, legal guardian were made aware of D/C instructions. [facility]elyn at [facility] was contacted at the time pt was discharged from facility, being transported back by EMS.

## 2017-08-21 NOTE — Consult Note (Signed)
Reason for Consult:Eyelid laceration Referring Physician: Juliette Alcide - ED  LADY WISHAM is an 71 y.o. female.  Chief complaint: fell today and hit eyelid and cheek  <principal problem not specified>  HPI: 71 yo BF nursing home resident fell today onto ground and hit face, resulting in eyelid/ facial laceration.  No blurriness.  No prior ocular surgery or probs.  Wears glasses but doesn't have with her.  Wants to go home without repair of laceration.  Denies eye pain.  CT scan without orbit fracture.  Past Medical History:  Diagnosis Date  . Asthma   . CHF (congestive heart failure) (HCC)   . Diabetes mellitus without complication (HCC)   . Hepatitis A   . Hepatitis B   . Hepatitis C   . Hypertension   . Respiratory failure (HCC)   . Schizophrenia (HCC)   . Seizures (HCC)   . Smoker    1 pack daily    ROS  No past surgical history on file.  No family history on file.  Social History:  reports that she has been smoking cigarettes.  She has a 50.00 pack-year smoking history. she has never used smokeless tobacco. Her alcohol and drug histories are not on file.  Allergies:  Allergies  Allergen Reactions  . Depakote [Valproic Acid] Other (See Comments)    unknown  . Haldol [Haloperidol Lactate] Other (See Comments)    Unknown   . Penicillins Other (See Comments)    Unknown   . Shellfish Allergy Other (See Comments)    unknown  . Trileptal [Oxcarbazepine] Other (See Comments)    unknown    Medications: Prior to Admission:  (Not in a hospital admission)  Reviewed   No results found for this or any previous visit (from the past 48 hour(s)).  Ct Head Wo Contrast  Result Date: 08/21/2017 CLINICAL DATA:  Pt presents via [facility] from [facility] for fall. Fall occurred from sitting position when pt was attempting to put out a cigarette from here wheel chair. Pt has approx 2 in lac under R eye as well as Hematoma on R eye brow. Pt is A/O but forgetful at times. Pt has hx  of dementia and currently only take 81mg  Asprin daily EXAM: CT HEAD WITHOUT CONTRAST CT MAXILLOFACIAL WITHOUT CONTRAST CT CERVICAL SPINE WITHOUT CONTRAST TECHNIQUE: Multidetector CT imaging of the head, cervical spine, and maxillofacial structures were performed using the standard protocol without intravenous contrast. Multiplanar CT image reconstructions of the cervical spine and maxillofacial structures were also generated. COMPARISON:  11/02/2016 FINDINGS: CT HEAD FINDINGS Brain: No evidence of acute infarction, hemorrhage, hydrocephalus, extra-axial collection or mass lesion/mass effect. 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 Vascular: No hyperdense vessel or unexpected calcification. Skull: Normal. Negative for fracture or focal lesion. Other: There is a right supraorbital hematoma. CT MAXILLOFACIAL FINDINGS headache. No Gerri Spore with the with this and no Osseous: No fracture.  No bone lesion. Orbits: There is a prominent right preseptal periorbital/supraorbital hematoma. There is associated laceration reflected by soft tissue air, anterior to the lateral wall of the right orbit. No radiopaque foreign body. No abnormality of either globe. No postseptal orbital inflammation or hematoma. Sinuses: Clear sinuses. Clear mastoid air cells and middle ear cavities. Soft tissues: No other soft tissue abnormality. CT CERVICAL SPINE FINDINGS Alignment: Normal. Skull base and vertebrae: No acute fracture. No primary bone lesion or focal pathologic process. Soft tissues and spinal canal: No prevertebral  fluid or swelling. No visible canal hematoma. Disc levels: Mild loss of disc height from C2-C3 through C5-C6. Mild spondylitis bulging with uncovertebral spurring noted at C3-C4, C4-C5 and C5-C6. No convincing disc herniation. There is a developmentally narrow canal aggravated by the disc degenerative changes. This causes moderate central spinal stenosis, most  evident at the C4 level. The AP diameter of the spinal canal measures 6 mm. Uncovertebral causes neural foraminal narrowing, greatest on the right at C5-C6, moderate to severe. Upper chest: No masses or adenopathy. No acute findings. Lung apices are clear. Other: None. IMPRESSION: HEAD CT 1. No acute intracranial abnormalities. 2. No skull fracture. MAXILLOFACIAL CT 1. No fractures. Right preseptal periorbital and supraorbital hematoma. CERVICAL CT 1. No fracture or acute finding. Electronically Signed   By: Amie Portlandavid  Ormond M.D.   On: 08/21/2017 14:19   Ct Cervical Spine Wo Contrast  Result Date: 08/21/2017 CLINICAL DATA:  [facility]. Fall occurred from sitting position when pt was attempting to put out a cigarette from here wheel chair. Pt has approx 2 in lac under R eye as well as Hematoma on R eye brow. Pt is A/O but forgetful at times. Pt has hx of dementia and currently only take 81mg  Asprin daily EXAM: CT HEAD WITHOUT CONTRAST CT MAXILLOFACIAL WITHOUT CONTRAST CT CERVICAL SPINE WITHOUT CONTRAST TECHNIQUE: Multidetector CT imaging of the head, cervical spine, and maxillofacial structures were performed using the standard protocol without intravenous contrast. Multiplanar CT image reconstructions of the cervical spine and maxillofacial structures were also generated. COMPARISON:  11/02/2016 FINDINGS: CT HEAD FINDINGS Brain: No evidence of acute infarction, hemorrhage, hydrocephalus, extra-axial collection or mass lesion/mass effect. 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 Vascular: No hyperdense vessel or unexpected calcification. Skull: Normal. Negative for fracture or focal lesion. Other: There is a right supraorbital hematoma. CT MAXILLOFACIAL FINDINGS headache. No Gerri SporeWesley with the with this and no Osseous: No fracture.  No bone lesion. Orbits: There is a prominent right preseptal periorbital/supraorbital  hematoma. There is associated laceration reflected by soft tissue air, anterior to the lateral wall of the right orbit. No radiopaque foreign body. No abnormality of either globe. No postseptal orbital inflammation or hematoma. Sinuses: Clear sinuses. Clear mastoid air cells and middle ear cavities. Soft tissues: No other soft tissue abnormality. CT CERVICAL SPINE FINDINGS Alignment: Normal. Skull base and vertebrae: No acute fracture. No primary bone lesion or focal pathologic process. Soft tissues and spinal canal: No prevertebral fluid or swelling. No visible canal hematoma. Disc levels: Mild loss of disc height from C2-C3 through C5-C6. Mild spondylitis bulging with uncovertebral spurring noted at C3-C4, C4-C5 and C5-C6. No convincing disc herniation. There is a developmentally narrow canal aggravated by the disc degenerative changes. This causes moderate central spinal stenosis, most evident at the C4 level. The AP diameter of the spinal canal measures 6 mm. Uncovertebral causes neural foraminal narrowing, greatest on the right at C5-C6, moderate to severe. Upper chest: No masses or adenopathy. No acute findings. Lung apices are clear. Other: None. IMPRESSION: HEAD CT 1. No acute intracranial abnormalities. 2. No skull fracture. MAXILLOFACIAL CT 1. No fractures. Right preseptal periorbital and supraorbital hematoma. CERVICAL CT 1. No fracture or acute finding. Electronically Signed   By: Amie Portlandavid  Ormond M.D.   On: 08/21/2017 14:19   Ct Maxillofacial Wo Contrast  Result Date: 08/21/2017 CLINICAL DATA:  [facility]. Fall occurred from sitting position when pt was attempting to put out a cigarette from here wheel chair. Pt has approx 2 in lac under R eye as well as Hematoma on R eye brow. Pt is A/O but forgetful at times. Pt has hx of dementia and currently only take 81mg  Asprin daily EXAM: CT HEAD WITHOUT CONTRAST CT MAXILLOFACIAL WITHOUT CONTRAST CT CERVICAL SPINE WITHOUT  CONTRAST TECHNIQUE: Multidetector CT imaging of the head, cervical spine, and maxillofacial structures were performed using the standard protocol without intravenous contrast. Multiplanar CT image reconstructions of the cervical spine and maxillofacial structures were also generated. COMPARISON:  11/02/2016 FINDINGS: CT HEAD FINDINGS Brain: No evidence of acute infarction, hemorrhage, hydrocephalus, extra-axial collection or mass lesion/mass effect. 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 Vascular: No hyperdense vessel or unexpected calcification. Skull: Normal. Negative for fracture or focal lesion. Other: There is a right supraorbital hematoma. CT MAXILLOFACIAL FINDINGS headache. No Gerri SporeWesley with the with this and no Osseous: No fracture.  No bone lesion. Orbits: There is a prominent right preseptal periorbital/supraorbital hematoma. There is associated laceration reflected by soft tissue air, anterior to the lateral wall of the right orbit. No radiopaque foreign body. No abnormality of either globe. No postseptal orbital inflammation or hematoma. Sinuses: Clear sinuses. Clear mastoid air cells and middle ear cavities. Soft tissues: No other soft tissue abnormality. CT CERVICAL SPINE FINDINGS Alignment: Normal. Skull base and vertebrae: No acute fracture. No primary bone lesion or focal pathologic process. Soft tissues and spinal canal: No prevertebral fluid or swelling. No visible canal hematoma. Disc levels: Mild loss of disc height from C2-C3 through C5-C6. Mild spondylitis bulging with uncovertebral spurring noted at C3-C4, C4-C5 and C5-C6. No convincing disc herniation. There is a developmentally narrow canal aggravated by the disc degenerative changes. This causes moderate central spinal stenosis, most evident at the C4 level. The AP diameter of the spinal canal measures 6 mm. Uncovertebral causes neural foraminal narrowing, greatest on the right at  C5-C6, moderate to severe. Upper chest: No masses or adenopathy. No acute findings. Lung apices are clear. Other: None. IMPRESSION: HEAD CT 1. No acute intracranial abnormalities. 2. No skull fracture. MAXILLOFACIAL CT 1. No fractures. Right preseptal periorbital and supraorbital hematoma. CERVICAL CT 1. No fracture or acute finding. Electronically Signed   By: Amie Portlandavid  Ormond M.D.   On: 08/21/2017 14:19    Blood pressure (!) 135/92, pulse 60, temperature 98.2 F (36.8 C), temperature source Oral, resp. rate 19, SpO2 100 %.  Mental status: Alert and Oriented x 4  Visual Acuity:  20/200 OD  20/200 near [place]  Pupils:  Equally round/ reactive to light.  No Afferent defect.  Motility:  Full/ orthophoric  Visual Fields:  Full to confrontation OU  IOP:  nml to palp  External/ Lids/ Lashes:  Eccymosis/ hematoma RUL.  Large 3+ cm laceration from lateral to lateral cantus to inner third of right lower lid.  Skin, subcutaneous tissue, and orbicularis muslce avulsed from lateral canthus and lower lid margin.  Lid margin itself is intact.  No injury to canaliculus or medial canthus.  Anterior Segment:  Conjunctiva:  Normal  OU  Cornea:  Normal  OU  Anterior Chamber: Normal  OU  Lens:   NS cataracts OU  Posterior Segment: Good red reflex bilaterally   Assessment/Plan: Eyelid laceration RLL and lateral canthus - see procedure/ op note for repair. Discussed r/b/a of repair with patient, who was  initially reluctant.  Showed patient the extent of injury with picture and she agreed to proceed.  Ice Packs  Bacitracin ointment QID Fu 1 week  Jimi Giza 08/21/2017, 5:48 PM

## 2017-08-21 NOTE — ED Provider Notes (Signed)
[facility] Emergency Department Provider Note   ____________________________________________   First MD Initiated Contact with Patient 08/21/17 1339     (approximate)  I have reviewed the triage vital signs and the nursing notes.   HISTORY  Chief Complaint Fall  Chief complaint is fall with laceration  HPI [name] is a 71 y.o. female patient reportedly fell from words out of her wheelchair off a curb landing on her face. Loss of consciousness is unknown fall was unwitnessed patient has a laceration immediately below the right eye not involving the eyelid. Patient says nothing hurts and she wants to go home.   Past Medical History:  Diagnosis Date  . Asthma   . CHF (congestive heart failure) (HCC)   . Diabetes mellitus without complication (HCC)   . Hepatitis A   . Hepatitis B   . Hepatitis C   . Hypertension   . Respiratory failure (HCC)   . Schizophrenia (HCC)   . Seizures (HCC)   . Smoker    1 pack daily    Patient Active Problem List   Diagnosis Date Noted  . Hyperlipidemia 11/03/2016  . Left-sided weakness 11/03/2016  . Dementia 11/03/2016  . 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 TIA (transient ischemic attack) 11/02/2016  . Bronchiectasis without acute exacerbation (HCC) 10/17/2013  . Other convulsions 09/19/2013  . Encounter for long-term (current) use of other medications 09/19/2013  . Hypercalcemia 09/19/2013  . Atrial fibrillation (HCC) 08/14/2013  . Congestive heart failure, unspecified 08/14/2013  . Hypopotassemia 08/14/2013  . Seizure disorder (HCC) 07/14/2013  . Schizophrenia (HCC) 07/14/2013  . HTN (hypertension) 07/14/2013  . Sepsis (HCC) 07/14/2013  . UTI (urinary tract infection) 07/14/2013  . Altered mental status 04/01/2013  . Anemia 03/31/2013  . Status epilepticus (HCC) 03/28/2013  . Acute respiratory failure (HCC) 03/28/2013  . DM (diabetes mellitus) (HCC) 03/28/2013    No past surgical history on  file.  Prior to Admission medications   Medication Sig Start Date End Date Taking? Authorizing Provider  acetaminophen (TYLENOL) 500 MG tablet Take 500 mg by mouth every 6 (six) hours as needed for pain.   Yes [provider]  aspirin EC 81 MG tablet Take 81 mg by mouth daily.   Yes [provider]  atorvastatin (LIPITOR) 40 MG tablet Take 1 tablet (40 mg total) by mouth daily at 6 PM. 11/03/16  Yes Katharina CaperVaickute, Rima, MD  B Complex-C (B-COMPLEX WITH VITAMIN C) tablet Take 1 tablet by mouth daily.   Yes [provider]  benztropine (COGENTIN) 0.5 MG tablet Take 0.5 mg by mouth 2 (two) times daily.   Yes [provider]  calcium carbonate (TUMS - DOSED IN MG ELEMENTAL CALCIUM) 500 MG chewable tablet Chew 1 tablet by mouth 2 (two) times daily.   Yes [provider]  clonazePAM (KLONOPIN) 0.5 MG tablet Take one tablet by mouth twice daily as needed for anxiety Patient taking differently: Take 0.5-1 mg by mouth 2 (two) times daily as needed for anxiety. Take 1mg  by mouth every morning (scheduled) and 0.5mg  by mouth daily as needed for anxiety 08/14/13  Yes Oneal GroutPandey, Mahima, MD  cyclobenzaprine (FLEXERIL) 5 MG tablet Take 5 mg by mouth 2 (two) times daily as needed for muscle spasms.   Yes [provider]  docusate sodium (COLACE) 100 MG capsule Take 100 mg by mouth 2 (two) times daily.   Yes [provider]  fluPHENAZine (PROLIXIN) 5 MG tablet Take 5 mg by mouth 2 (  two) times daily. At 1400 and 2000   Yes [provider]  lacosamide (VIMPAT) 200 MG TABS tablet Take one tablet by mouth twice daily 08/15/13  Yes Sharon SellerEubanks, Jessica K, NP  lamoTRIgine (LAMICTAL) 200 MG tablet Take 200 mg by mouth 2 (two) times daily.    Yes [provider]  levETIRAcetam (KEPPRA) 1000 MG tablet Take 1 tablet (1,000 mg total) by mouth 2 (two) times daily. 07/14/13  Yes Street, Stephanie Couphristopher M, MD  magnesium oxide (MAG-OX) 400 MG tablet Take 400 mg by mouth  daily.   Yes [provider]  metoprolol (LOPRESSOR) 50 MG tablet Take 50 mg by mouth 2 (two) times daily. Do not administer if pulse <50   Yes [provider]  omeprazole (PRILOSEC) 20 MG capsule Take 20 mg by mouth daily. 1 hour before a meal   Yes [provider]  oxybutynin (DITROPAN) 5 MG tablet Take 5 mg by mouth 2 (two) times daily.   Yes [provider]  phenytoin (DILANTIN) 100 MG ER capsule Take 100 mg by mouth 2 (two) times daily.    Yes [provider]  thiamine (VITAMIN B-1) 100 MG tablet Take 100 mg by mouth daily.   Yes [provider]  fluPHENAZine decanoate (PROLIXIN) 25 MG/ML injection Inject into the muscle every 14 (fourteen) days.    [provider]  mupirocin ointment (BACTROBAN) 2 % Apply to affected area 3 times daily Patient not taking: Reported on 08/21/2017 11/02/16 11/02/17  Emily FilbertWilliams, Jonathan E, MD    Allergies Depakote [valproic acid]; Haldol [haloperidol lactate]; Penicillins; Shellfish allergy; and Trileptal [oxcarbazepine]  No family history on file.  Social History Social History   Tobacco Use  . Smoking status: Current Every Day Smoker    Packs/day: 1.00    Years: 50.00    Pack years: 50.00    Types: Cigarettes  . Smokeless tobacco: Never Used  Substance Use Topics  . Alcohol use: Not on file  . Drug use: Not on file    Review of Systems  Constitutional: No fever/chills Eyes: No visual changes. ENT: No sore throat. Cardiovascular: Denies chest pain. Respiratory: Denies shortness of breath. Gastrointestinal: No abdominal pain.  No nausea, no vomiting.  No diarrhea.  No constipation. Genitourinary: Negative for dysuria. Musculoskeletal: Negative for back pain. Skin: Negative for rash. Neurological: Negative for headaches!, focal weakness   ____________________________________________   PHYSICAL EXAM:  VITAL SIGNS: ED Triage Vitals  Enc Vitals Group     BP      Pulse       Resp      Temp      Temp src      SpO2      Weight      Height      Head Circumference      Peak Flow      Pain Score      Pain Loc      Pain Edu?      Excl. in GC?     Constitutional: Alert and oriented. Well appearing and in no acute distress. Eyes: Conjunctivae are normal.  Head: Atraumatic except for right side of the face around the eye and the right forehead and right forehead has a contusion of the laceration immediately below the right eye which was described.. Nose: No congestion/rhinnorhea. Mouth/Throat: Mucous membranes are moist.  Oropharynx non-erythematous. Neck: No stridor.   Cardiovascular: Normal rate, regular rhythm. Grossly normal heart sounds.  Good peripheral circulation. Respiratory: Normal respiratory  effort.  No retractions. Lungs CTAB. Gastrointestinal: Soft and nontender. No distention. No abdominal bruits. No CVA tenderness. Musculoskeletal: No lower extremity tenderness nor edema.  No joint effusions. Neurologic:  Normal speech and language. No gross focal neurologic deficits are appreciated.  Skin:  Skin is warm, dry and intactexcept in the area of the laceration. No rash noted. Psychiatric: Mood and affect are normal. Speech and behavior are normal.  ____________________________________________   LABS (all labs ordered are listed, but only abnormal results are displayed)  Labs Reviewed - No data to display ____________________________________________  EKG  EKG read and interpreted by me shows normal sinus rhythm rate of 75 normal axis no acute ST-T wave changes ____________________________________________  RADIOLOGY  CT of the head and face and neck show no fractures there are some air in the preseptal area of the orbit but otherwise nothing else. ____________________________________________   PROCEDURES  Procedure(s) performed:  Procedures  Critical Care performed:   ____________________________________________   INITIAL  IMPRESSION / ASSESSMENT AND PLAN / ED COURSE  old records reviewed which show she has a history of seizure disorder DVT hyperlipidemia pulmonary emphysema diabetes hypertension depression and drug abuse hepatitis A B and C colitis per night schizophrenia and septic shock. patient has a laceration that extends from the middle of the right lower eyelid over to the canthus. Laceration is then about 2 mm of the lid margin is deep enough to expose the muscle. I anesthetized this with 1% lidocaine with epi however I called Dr. Inez Pilgrim the ophthalmologist he will come in to repair the injury as it is approaching the limits of my competence. Patient's eye itself appears to be intact the conjunctiva is not red to movement of the eyeball itself is normal the pupil looks normal    please see Dr. Skip Estimable note for his treatment. He wanted to give the patient bacitracin 3 times a day and wrote a prescription for her she missed taking her low but will get it back to get it when she gets back to the nursing home. She still having a little bit of oozing from the skinned area surrounding the laceration but remains awake alert and making sense. ____________________________________________   FINAL CLINICAL IMPRESSION(S) / ED DIAGNOSES  Final diagnoses:  Facial laceration, initial encounter     ED Discharge Orders    None       Note:  This document was prepared using Dragon voice recognition software and may include unintentional dictation errors.    Arnaldo Natal, MD 08/21/17 2132

## 2017-08-21 NOTE — Progress Notes (Signed)
Patient ID: Guy FrancoBeverly A Mcnall, female   DOB: April 03, 1946, 71 y.o.   MRN: [id number] PROCEDURE NOTE: Repair of eyelid laceration in ED 08/21/17  PreOp: 3 cm laceration R lateral canthus and lower lid Post - same Surg - [place], MD Anes - local w 2% xylocaine w epi compl - none  Description - The area was inspected, then prepped and draped.  A 3 cm laceration involved muscle, subcutaneous tissue, and skin avulsed from right lower eyelid.Deep tissues were approximated with 6-0 vicryl subcutaneous x5.  Skin was closed with 6-0 and 7-0 vicryl x 10-12.  No complications.  Bacitracin ointment applied  Beya Tipps

## 2017-08-21 NOTE — ED Notes (Signed)
Patient transported to CT 

## 2017-08-21 NOTE — ED Notes (Signed)
Pt oriented to person,place, and situation. Not able to recall dates

## 2017-08-21 NOTE — ED Notes (Signed)
MD verbalized discharge orders of Bacitracin ointment to suture site 4 times daily, and request pt be assessed by physician in 1 week

## 2017-08-21 NOTE — ED Triage Notes (Signed)
[place] [facility] [place] Fall occurred from sitting position when pt was attempting to put out a cigarette from here wheel chair. Pt has approx 2 in lac under R eye as well as Hematoma on R eye brow. Pt is A/O but forgetful at times. Pt has hx of dementia and currently only take 81mg  Asprin daily

## 2019-09-26 ENCOUNTER — Encounter: Payer: Self-pay | Admitting: Nurse Practitioner

## 2019-09-26 ENCOUNTER — Inpatient Hospital Stay
Admission: EM | Admit: 2019-09-26 | Discharge: 2019-09-28 | DRG: 689 | Disposition: A | Discharge status: Skilled Nursing Facility | Payer: Medicare Other | Attending: Internal Medicine | Admitting: Internal Medicine

## 2019-09-26 ENCOUNTER — Other Ambulatory Visit: Payer: Self-pay

## 2019-09-26 ENCOUNTER — Inpatient Hospital Stay: Payer: Medicare Other

## 2019-09-26 ENCOUNTER — Emergency Department: Payer: Medicare Other

## 2019-09-26 DIAGNOSIS — I10 Essential (primary) hypertension: Secondary | ICD-10-CM | POA: Diagnosis not present

## 2019-09-26 DIAGNOSIS — F17210 Nicotine dependence, cigarettes, uncomplicated: Secondary | ICD-10-CM | POA: Diagnosis present

## 2019-09-26 DIAGNOSIS — R410 Disorientation, unspecified: Secondary | ICD-10-CM | POA: Diagnosis not present

## 2019-09-26 DIAGNOSIS — E785 Hyperlipidemia, unspecified: Secondary | ICD-10-CM | POA: Diagnosis present

## 2019-09-26 DIAGNOSIS — Z8673 Personal history of transient ischemic attack (TIA), and cerebral infarction without residual deficits: Secondary | ICD-10-CM | POA: Diagnosis not present

## 2019-09-26 DIAGNOSIS — Z888 Allergy status to other drugs, medicaments and biological substances status: Secondary | ICD-10-CM

## 2019-09-26 DIAGNOSIS — B159 Hepatitis A without hepatic coma: Secondary | ICD-10-CM | POA: Diagnosis present

## 2019-09-26 DIAGNOSIS — F209 Schizophrenia, unspecified: Secondary | ICD-10-CM | POA: Insufficient documentation

## 2019-09-26 DIAGNOSIS — F419 Anxiety disorder, unspecified: Secondary | ICD-10-CM | POA: Diagnosis present

## 2019-09-26 DIAGNOSIS — E119 Type 2 diabetes mellitus without complications: Secondary | ICD-10-CM | POA: Diagnosis present

## 2019-09-26 DIAGNOSIS — G9341 Metabolic encephalopathy: Secondary | ICD-10-CM | POA: Diagnosis present

## 2019-09-26 DIAGNOSIS — J45909 Unspecified asthma, uncomplicated: Secondary | ICD-10-CM | POA: Diagnosis present

## 2019-09-26 DIAGNOSIS — Z7982 Long term (current) use of aspirin: Secondary | ICD-10-CM

## 2019-09-26 DIAGNOSIS — F0390 Unspecified dementia without behavioral disturbance: Secondary | ICD-10-CM | POA: Diagnosis present

## 2019-09-26 DIAGNOSIS — Z20822 Contact with and (suspected) exposure to covid-19: Secondary | ICD-10-CM | POA: Diagnosis present

## 2019-09-26 DIAGNOSIS — B1910 Unspecified viral hepatitis B without hepatic coma: Secondary | ICD-10-CM | POA: Diagnosis present

## 2019-09-26 DIAGNOSIS — I110 Hypertensive heart disease with heart failure: Secondary | ICD-10-CM | POA: Diagnosis present

## 2019-09-26 DIAGNOSIS — Z880 Allergy status to penicillin: Secondary | ICD-10-CM

## 2019-09-26 DIAGNOSIS — N3000 Acute cystitis without hematuria: Secondary | ICD-10-CM | POA: Diagnosis not present

## 2019-09-26 DIAGNOSIS — Z9181 History of falling: Secondary | ICD-10-CM | POA: Diagnosis not present

## 2019-09-26 DIAGNOSIS — G40909 Epilepsy, unspecified, not intractable, without status epilepticus: Secondary | ICD-10-CM | POA: Diagnosis present

## 2019-09-26 DIAGNOSIS — N3090 Cystitis, unspecified without hematuria: Principal | ICD-10-CM | POA: Diagnosis present

## 2019-09-26 DIAGNOSIS — Z91013 Allergy to seafood: Secondary | ICD-10-CM

## 2019-09-26 DIAGNOSIS — I5032 Chronic diastolic (congestive) heart failure: Secondary | ICD-10-CM | POA: Diagnosis present

## 2019-09-26 DIAGNOSIS — R8290 Unspecified abnormal findings in urine: Secondary | ICD-10-CM | POA: Diagnosis present

## 2019-09-26 DIAGNOSIS — N390 Urinary tract infection, site not specified: Secondary | ICD-10-CM | POA: Diagnosis present

## 2019-09-26 DIAGNOSIS — B1920 Unspecified viral hepatitis C without hepatic coma: Secondary | ICD-10-CM | POA: Diagnosis present

## 2019-09-26 DIAGNOSIS — Z79899 Other long term (current) drug therapy: Secondary | ICD-10-CM

## 2019-09-26 DIAGNOSIS — R569 Unspecified convulsions: Secondary | ICD-10-CM

## 2019-09-26 LAB — CBC WITH DIFFERENTIAL/PLATELET
Abs Immature Granulocytes: 0.02 10*3/uL (ref 0.00–0.07)
Basophils Absolute: 0 10*3/uL (ref 0.0–0.1)
Basophils Relative: 0 %
Eosinophils Absolute: 0 10*3/uL (ref 0.0–0.5)
Eosinophils Relative: 0 %
HCT: 37.3 % (ref 36.0–46.0)
Hemoglobin: 12.2 g/dL (ref 12.0–15.0)
Immature Granulocytes: 0 %
Lymphocytes Relative: 25 %
Lymphs Abs: 2.7 10*3/uL (ref 0.7–4.0)
MCH: 32.4 pg (ref 26.0–34.0)
MCHC: 32.7 g/dL (ref 30.0–36.0)
MCV: 99.2 fL (ref 80.0–100.0)
Monocytes Absolute: 0.8 10*3/uL (ref 0.1–1.0)
Monocytes Relative: 7 %
Neutro Abs: 7.2 10*3/uL (ref 1.7–7.7)
Neutrophils Relative %: 68 %
Platelets: 245 10*3/uL (ref 150–400)
RBC: 3.76 MIL/uL — ABNORMAL LOW (ref 3.87–5.11)
RDW: 13.1 % (ref 11.5–15.5)
WBC: 10.7 10*3/uL — ABNORMAL HIGH (ref 4.0–10.5)
nRBC: 0 % (ref 0.0–0.2)

## 2019-09-26 LAB — PROTIME-INR
INR: 1 (ref 0.8–1.2)
Prothrombin Time: 13.1 seconds (ref 11.4–15.2)

## 2019-09-26 LAB — URINALYSIS, COMPLETE (UACMP) WITH MICROSCOPIC
Bilirubin Urine: NEGATIVE
Glucose, UA: NEGATIVE mg/dL
Ketones, ur: 20 mg/dL — AB
Nitrite: NEGATIVE
Protein, ur: 100 mg/dL — AB
RBC / HPF: >50 RBC/hpf — ABNORMAL HIGH (ref 0–5)
Specific Gravity, Urine: 1.017 (ref 1.005–1.030)
WBC, UA: >50 WBC/hpf — ABNORMAL HIGH (ref 0–5)
pH: 7 (ref 5.0–8.0)

## 2019-09-26 LAB — COMPREHENSIVE METABOLIC PANEL
ALT: 23 U/L (ref 0–44)
AST: 32 U/L (ref 15–41)
Albumin: 3.4 g/dL — ABNORMAL LOW (ref 3.5–5.0)
Alkaline Phosphatase: 61 U/L (ref 38–126)
Anion gap: 12 (ref 5–15)
BUN: 21 mg/dL (ref 8–23)
CO2: 23 mmol/L (ref 22–32)
Calcium: 9.3 mg/dL (ref 8.9–10.3)
Chloride: 110 mmol/L (ref 98–111)
Creatinine, Ser: 0.59 mg/dL (ref 0.44–1.00)
GFR calc Af Amer: >60 mL/min (ref >60)
GFR calc non Af Amer: >60 mL/min (ref >60)
Glucose, Bld: 161 mg/dL — ABNORMAL HIGH (ref 70–99)
Potassium: 3.5 mmol/L (ref 3.5–5.1)
Sodium: 145 mmol/L (ref 135–145)
Total Bilirubin: 0.4 mg/dL (ref 0.3–1.2)
Total Protein: 6.9 g/dL (ref 6.5–8.1)

## 2019-09-26 LAB — GLUCOSE, CAPILLARY
Glucose-Capillary: 62 mg/dL — ABNORMAL LOW (ref 70–99)
Glucose-Capillary: 131 mg/dL — ABNORMAL HIGH (ref 70–99)
Glucose-Capillary: 210 mg/dL — ABNORMAL HIGH (ref 70–99)

## 2019-09-26 LAB — RESPIRATORY PANEL BY RT PCR (FLU A&B, COVID)
Influenza A by PCR: NEGATIVE
Influenza B by PCR: NEGATIVE
SARS Coronavirus 2 by RT PCR: NEGATIVE

## 2019-09-26 LAB — LIPASE, BLOOD: Lipase: 24 U/L (ref 11–51)

## 2019-09-26 LAB — PROCALCITONIN: Procalcitonin: <0.1 ng/mL

## 2019-09-26 LAB — APTT: aPTT: 29 seconds (ref 24–36)

## 2019-09-26 LAB — FERRITIN: Ferritin: 129 ng/mL (ref 11–307)

## 2019-09-26 LAB — LACTATE DEHYDROGENASE: LDH: 235 U/L — ABNORMAL HIGH (ref 98–192)

## 2019-09-26 LAB — HEMOGLOBIN A1C
Hgb A1c MFr Bld: 6.6 % — ABNORMAL HIGH (ref 4.8–5.6)
Mean Plasma Glucose: 142.72 mg/dL

## 2019-09-26 LAB — ETHANOL: Alcohol, Ethyl (B): <10 mg/dL (ref <10)

## 2019-09-26 LAB — LACTIC ACID, PLASMA: Lactic Acid, Venous: 1.2 mmol/L (ref 0.5–1.9)

## 2019-09-26 LAB — AMMONIA: Ammonia: 30 umol/L (ref 9–35)

## 2019-09-26 LAB — FIBRIN DERIVATIVES D-DIMER (ARMC ONLY): Fibrin derivatives D-dimer (ARMC): 1759.24 ng/mL (FEU) — ABNORMAL HIGH (ref 0.00–499.00)

## 2019-09-26 MED ORDER — INSULIN ASPART 100 UNIT/ML ~~LOC~~ SOLN
0.0000 [IU] | Freq: Every day | SUBCUTANEOUS | Status: DC
  Administered 2019-09-26: 22:00:00 2 [IU] via SUBCUTANEOUS
  Filled 2019-09-26: qty 1

## 2019-09-26 MED ORDER — DEXTROSE-NACL 5-0.9 % IV SOLN: INTRAVENOUS | Status: DC

## 2019-09-26 MED ORDER — SODIUM CHLORIDE 0.9 % IV BOLUS (SEPSIS)
1000.0000 mL | Freq: Once | INTRAVENOUS | Status: AC
  Administered 2019-09-26: 1000 mL via INTRAVENOUS

## 2019-09-26 MED ORDER — METOPROLOL TARTRATE 5 MG/5ML IV SOLN
5.0000 mg | Freq: Four times a day (QID) | INTRAVENOUS | Status: DC
  Administered 2019-09-26 – 2019-09-27 (×3): 5 mg via INTRAVENOUS
  Filled 2019-09-26 (×3): qty 5

## 2019-09-26 MED ORDER — ENOXAPARIN SODIUM 40 MG/0.4ML ~~LOC~~ SOLN
40.0000 mg | Freq: Every day | SUBCUTANEOUS | Status: DC
  Administered 2019-09-26 – 2019-09-28 (×3): 40 mg via SUBCUTANEOUS
  Filled 2019-09-26 (×3): qty 0.4

## 2019-09-26 MED ORDER — SODIUM CHLORIDE 0.9 % IV SOLN
1.0000 g | Freq: Every day | INTRAVENOUS | Status: DC
  Administered 2019-09-26 – 2019-09-27 (×2): 1 g via INTRAVENOUS
  Filled 2019-09-26 (×3): qty 10

## 2019-09-26 MED ORDER — ONDANSETRON HCL 4 MG/2ML IJ SOLN: 4.0000 mg | Freq: Four times a day (QID) | INTRAMUSCULAR | Status: DC | PRN

## 2019-09-26 MED ORDER — METRONIDAZOLE IN NACL 5-0.79 MG/ML-% IV SOLN
500.0000 mg | Freq: Once | INTRAVENOUS | Status: AC
  Administered 2019-09-26: 500 mg via INTRAVENOUS
  Filled 2019-09-26: qty 100

## 2019-09-26 MED ORDER — LEVETIRACETAM IN NACL 1000 MG/100ML IV SOLN
1000.0000 mg | Freq: Two times a day (BID) | INTRAVENOUS | Status: DC
  Administered 2019-09-26 – 2019-09-27 (×2): 1000 mg via INTRAVENOUS
  Filled 2019-09-26 (×3): qty 100

## 2019-09-26 MED ORDER — INSULIN ASPART 100 UNIT/ML ~~LOC~~ SOLN
0.0000 [IU] | Freq: Three times a day (TID) | SUBCUTANEOUS | Status: DC
  Administered 2019-09-27 – 2019-09-28 (×2): 1 [IU] via SUBCUTANEOUS
  Filled 2019-09-26 (×2): qty 1

## 2019-09-26 MED ORDER — SODIUM CHLORIDE 0.9% FLUSH
3.0000 mL | Freq: Two times a day (BID) | INTRAVENOUS | Status: DC
  Administered 2019-09-26 – 2019-09-27 (×3): 3 mL via INTRAVENOUS

## 2019-09-26 MED ORDER — CEFEPIME HCL 2 G IJ SOLR
2.0000 g | Freq: Once | INTRAMUSCULAR | Status: AC
  Administered 2019-09-26: 12:00:00 2 g via INTRAVENOUS
  Filled 2019-09-26: qty 2

## 2019-09-26 MED ORDER — ONDANSETRON HCL 4 MG PO TABS: 4.0000 mg | ORAL_TABLET | Freq: Four times a day (QID) | ORAL | Status: DC | PRN

## 2019-09-26 MED ORDER — ZIPRASIDONE MESYLATE 20 MG IM SOLR
10.0000 mg | INTRAMUSCULAR | Status: DC | PRN
  Filled 2019-09-26: qty 20

## 2019-09-26 MED ORDER — SODIUM CHLORIDE 0.9 % IV SOLN: INTRAVENOUS | Status: DC

## 2019-09-26 MED ORDER — ACETAMINOPHEN 650 MG RE SUPP: 650.0000 mg | Freq: Four times a day (QID) | RECTAL | Status: DC | PRN

## 2019-09-26 MED ORDER — LORAZEPAM 2 MG/ML IJ SOLN
1.0000 mg | Freq: Four times a day (QID) | INTRAMUSCULAR | Status: DC
  Administered 2019-09-26 – 2019-09-27 (×2): 1 mg via INTRAVENOUS
  Filled 2019-09-26 (×2): qty 1

## 2019-09-26 MED ORDER — ACETAMINOPHEN 325 MG PO TABS: 650.0000 mg | ORAL_TABLET | Freq: Four times a day (QID) | ORAL | Status: DC | PRN

## 2019-09-26 MED ORDER — ZIPRASIDONE MESYLATE 20 MG IM SOLR
10.0000 mg | Freq: Once | INTRAMUSCULAR | Status: AC
  Administered 2019-09-26: 12:00:00 10 mg via INTRAMUSCULAR
  Filled 2019-09-26: qty 20

## 2019-09-26 MED ORDER — VANCOMYCIN HCL IN DEXTROSE 1-5 GM/200ML-% IV SOLN
1000.0000 mg | Freq: Once | INTRAVENOUS | Status: AC
  Administered 2019-09-26: 1000 mg via INTRAVENOUS
  Filled 2019-09-26: qty 200

## 2019-09-26 MED ORDER — SODIUM CHLORIDE 0.9 % IV SOLN
200.0000 mg | Freq: Two times a day (BID) | INTRAVENOUS | Status: DC
  Administered 2019-09-26 – 2019-09-27 (×2): 200 mg via INTRAVENOUS
  Filled 2019-09-26 (×3): qty 20

## 2019-09-26 MED ORDER — LORAZEPAM 2 MG/ML IJ SOLN
1.0000 mg | INTRAMUSCULAR | Status: DC | PRN
  Administered 2019-09-26 – 2019-09-27 (×2): 1 mg via INTRAVENOUS
  Filled 2019-09-26 (×2): qty 1

## 2019-09-26 MED ORDER — PANTOPRAZOLE SODIUM 40 MG IV SOLR
40.0000 mg | INTRAVENOUS | Status: DC
  Administered 2019-09-26 – 2019-09-27 (×2): 40 mg via INTRAVENOUS
  Filled 2019-09-26 (×2): qty 40

## 2019-09-26 NOTE — Progress Notes (Signed)
CODE SEPSIS - PHARMACY COMMUNICATION  **Broad Spectrum Antibiotics should be administered within 1 hour of Sepsis diagnosis**  Time Code Sepsis Called/Page Received: 1235  Antibiotics Ordered: Cefepime, Vancomycin, Metronidazole   Time of 1st antibiotic administration: Cefepime 1218  Additional action taken by pharmacy: none indicated   If necessary, Name of Provider/Nurse Contacted:N/A    Shemaiah Round L, RPh 09/26/2019  1:10 PM

## 2019-09-26 NOTE — ED Triage Notes (Addendum)
Pt [facility] [place] [facility] via [facility]. Upon arrival to [facility] pt was found with fever 101.3, staff admx 1000mg  tylenol. Pt with a hx of dementia, schizophrenia. HTN. [facility] unable to to tell [facility] if pt was given her morning medication or med over the weekend. EDP Stafford at bedside upon arrival. Upon arrival pt agitated and AMS. Unable to respond to any questions at this time.
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
## 2019-09-26 NOTE — ED Notes (Signed)
Pt to Ct at this time.

## 2019-09-26 NOTE — Consult Note (Signed)
PHARMACY -  BRIEF ANTIBIOTIC NOTE   Pharmacy has received consult(s) for Vancomycin and Cefepime from an ED provider.  The patient's profile has been reviewed for ht/wt/allergies/indication/available labs.    One time order(s) placed for Vancomycin 1g and Cefepime 2g x 1 dose each.  Further antibiotics/pharmacy consults should be ordered by admitting physician if indicated.                       Thank you, Bettey Costa 09/26/2019  11:59 AM

## 2019-09-26 NOTE — ED Notes (Signed)
This RN spoke with Lupita Leash from [facility] [phone], who st pt LKW on 09/25/2019 at 11:30am. St this morning pt was found with fever "102 to 104", pt was   unable to answer questions, disoriented, combative with staff. Pt's baseline is oriented x4.

## 2019-09-26 NOTE — Progress Notes (Signed)
Patient arrived from the ED. She did not want staff to bother her but she did finally allow up to give her care (VS, meds, blood work).

## 2019-09-26 NOTE — Progress Notes (Signed)
Hypoglycemic Event  CBG: 62  Treatment: vanilla ice cream per patient request Symptoms: none  Follow-up CBG: Time:1815 CBG Result:131  Possible Reasons for Event: eating poorly  Comments/MD notified:protocol followed    Retia Passe
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
## 2019-09-26 NOTE — ED Notes (Signed)
Charge Secondary school teacher at bedside to assist this Charity fundraiser.

## 2019-09-26 NOTE — Progress Notes (Signed)
Patient resting at this time.

## 2019-09-26 NOTE — ED Provider Notes (Signed)
[facility] Emergency Department Provider Note  ____________________________________________  Time seen: Approximately 12:17 PM  I have reviewed the triage vital signs and the nursing notes.   HISTORY  Chief Complaint Altered Mental Status and Fever    Level 5 Caveat: Portions of the History and Physical including HPI and review of systems are unable to be completely obtained due to patient being a poor historian   HPI [name] is a 74 y.o. female with a history of CHF diabetes hypertension schizophrenia dementia who is brought to the ED from [place] years due to fever.  Staff there reported temperature of 1-1.3 and gave 1000 mg of Tylenol.  Patient is not able to participate in exam or interview at this time, speaking in a nonstop incoherent cadence.   No known sick contacts or Covid exposures   Past Medical History:  Diagnosis Date  . Asthma   . CHF (congestive heart failure) (HCC)   . Diabetes mellitus without complication (HCC)   . Hepatitis A   . Hepatitis B   . Hepatitis C   . Hypertension   . Respiratory failure (HCC)   . Schizophrenia (HCC)   . Seizures (HCC)   . Smoker    1 pack daily     Patient Active Problem List   Diagnosis Date Noted  . Hyperlipidemia 11/03/2016  . Left-sided weakness 11/03/2016  . Dementia (HCC) 11/03/2016  . 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 TIA (transient ischemic attack) 11/02/2016  . Bronchiectasis without acute exacerbation (HCC) 10/17/2013  . Other convulsions 09/19/2013  . Encounter for long-term (current) use of other medications 09/19/2013  . Hypercalcemia 09/19/2013  . Atrial fibrillation (HCC) 08/14/2013  . Congestive heart failure, unspecified 08/14/2013  . Hypopotassemia 08/14/2013  . Seizure disorder (HCC) 07/14/2013  . Schizophrenia (HCC) 07/14/2013  . HTN (hypertension) 07/14/2013  . Sepsis (HCC) 07/14/2013  . UTI (urinary tract infection) 07/14/2013  . Altered mental status 04/01/2013  .  Anemia 03/31/2013  . Status epilepticus (HCC) 03/28/2013  . Acute respiratory failure (HCC) 03/28/2013  . DM (diabetes mellitus) (HCC) 03/28/2013     History reviewed. No pertinent surgical history.   Prior to Admission medications   Medication Sig Start Date End Date Taking? Authorizing Provider  acetaminophen (TYLENOL) 500 MG tablet Take 500 mg by mouth every 6 (six) hours as needed for pain.    [provider]  aspirin EC 81 MG tablet Take 81 mg by mouth daily.    [provider]  atorvastatin (LIPITOR) 40 MG tablet Take 1 tablet (40 mg total) by mouth daily at 6 PM. 11/03/16   Katharina Caper, MD  B Complex-C (B-COMPLEX WITH VITAMIN C) tablet Take 1 tablet by mouth daily.    [provider]  benztropine (COGENTIN) 0.5 MG tablet Take 0.5 mg by mouth 2 (two) times daily.    [provider]  calcium carbonate (TUMS - DOSED IN MG ELEMENTAL CALCIUM) 500 MG chewable tablet Chew 1 tablet by mouth 2 (two) times daily.    [provider]  clonazePAM (KLONOPIN) 0.5 MG tablet Take one tablet by mouth twice daily as needed for anxiety Patient taking differently: Take 0.5-1 mg by mouth 2 (two) times daily as needed for anxiety. Take 1mg  by mouth every morning (scheduled) and 0.5mg  by mouth daily as needed for anxiety 08/14/13   08/16/13, MD  cyclobenzaprine (FLEXERIL) 5 MG tablet Take 5 mg by mouth 2 (two) times daily as needed for muscle spasms.  [provider]  docusate sodium (COLACE) 100 MG capsule Take 100 mg by mouth 2 (two) times daily.    [provider]  fluPHENAZine (PROLIXIN) 5 MG tablet Take 5 mg by mouth 2 (two) times daily. At 1400 and 2000    [provider]  fluPHENAZine decanoate (PROLIXIN) 25 MG/ML injection Inject into the muscle every 14 (fourteen) days.    [provider]  lacosamide (VIMPAT) 200 MG TABS tablet Take one tablet by mouth twice daily 08/15/13   Sharon Seller, NP  lamoTRIgine  (LAMICTAL) 200 MG tablet Take 200 mg by mouth 2 (two) times daily.     [provider]  levETIRAcetam (KEPPRA) 1000 MG tablet Take 1 tablet (1,000 mg total) by mouth 2 (two) times daily. 07/14/13   Street, Stephanie Coup, MD  magnesium oxide (MAG-OX) 400 MG tablet Take 400 mg by mouth daily.    [provider]  metoprolol (LOPRESSOR) 50 MG tablet Take 50 mg by mouth 2 (two) times daily. Do not administer if pulse <50    [provider]  omeprazole (PRILOSEC) 20 MG capsule Take 20 mg by mouth daily. 1 hour before a meal    [provider]  oxybutynin (DITROPAN) 5 MG tablet Take 5 mg by mouth 2 (two) times daily.    [provider]  phenytoin (DILANTIN) 100 MG ER capsule Take 100 mg by mouth 2 (two) times daily.     [provider]  thiamine (VITAMIN B-1) 100 MG tablet Take 100 mg by mouth daily.    [provider]     Allergies Depakote [valproic acid], Haldol [haloperidol lactate], Penicillins, Shellfish allergy, and Trileptal [oxcarbazepine]   History reviewed. No pertinent family history.  Social History Social History   Tobacco Use  . Smoking status: Current Every Day Smoker    Packs/day: 1.00    Years: 50.00    Pack years: 50.00    Types: Cigarettes  . Smokeless tobacco: Never Used  Substance Use Topics  . Alcohol use: Not on file  . Drug use: Not on file    Review of Systems Level 5 Caveat: Portions of the History and Physical including HPI and review of systems are unable to be completely obtained due to patient being a poor historian   Constitutional: Positive fever.  ENT:   No rhinorrhea. Cardiovascular:   No chest pain or syncope. Respiratory:   No dyspnea or cough. Gastrointestinal:   Negative for abdominal pain, vomiting and diarrhea.  Musculoskeletal:   Negative for focal pain or swelling ____________________________________________   PHYSICAL EXAM:  VITAL SIGNS: ED Triage Vitals  Enc Vitals  Group     BP      Pulse      Resp      Temp      Temp src      SpO2      Weight      Height      Head Circumference      Peak Flow      Pain Score      Pain Loc      Pain Edu?      Excl. in GC?     Vital signs reviewed, nursing assessments reviewed.   Constitutional: Awake and alert.  Not oriented. Non-toxic appearance.  Uncooperative, agitated, unable to participate in exam Eyes:   Conjunctivae are normal. EOMI. PERRL. ENT      Head:   Normocephalic and atraumatic.  Nose:   No congestion/rhinnorhea.       Mouth/Throat:   Dry mucous membranes, no pharyngeal erythema. No peritonsillar mass.       Neck:   No meningismus. Full ROM. Hematological/Lymphatic/Immunilogical:   No cervical lymphadenopathy. Cardiovascular:   RRR. Symmetric bilateral radial and DP pulses.  No murmurs. Cap refill less than 2 seconds. Respiratory:   Normal respiratory effort without tachypnea/retractions. Breath sounds are clear and equal bilaterally. No wheezes/rales/rhonchi. Gastrointestinal:   Soft and nontender. Non distended. There is no CVA tenderness.  No rebound, rigidity, or guarding.  Musculoskeletal:   Normal range of motion in all extremities. No joint effusions.  No lower extremity tenderness.  No edema. Neurologic:   Incoherent disorganized speech. Normal tone without rigidity Motor grossly intact. Skin:    Skin is warm, dry and intact. No rash noted.  No petechiae, purpura, or bullae.  ____________________________________________    LABS (pertinent positives/negatives) (all labs ordered are listed, but only abnormal results are displayed) Labs Reviewed  CBC WITH DIFFERENTIAL/PLATELET - Abnormal; Notable for the following components:      Result Value   WBC 10.7 (*)    RBC 3.76 (*)    All other components within normal limits  URINALYSIS, COMPLETE (UACMP) WITH MICROSCOPIC - Abnormal; Notable for the following components:   Color, Urine AMBER (*)    APPearance TURBID (*)     Hgb urine dipstick SMALL (*)    Ketones, ur 20 (*)    Protein, ur 100 (*)    Leukocytes,Ua SMALL (*)    RBC / HPF >50 (*)    WBC, UA >50 (*)    Bacteria, UA MANY (*)    All other components within normal limits  CULTURE, BLOOD (ROUTINE X 2)  CULTURE, BLOOD (ROUTINE X 2)  URINE CULTURE  RESPIRATORY PANEL BY RT PCR (FLU A&B, COVID)  LACTIC ACID, PLASMA  ETHANOL  PROTIME-INR  APTT  COMPREHENSIVE METABOLIC PANEL  LIPASE, BLOOD  PROCALCITONIN  HIGH SENSITIVITY CRP  LACTATE DEHYDROGENASE  FIBRIN DERIVATIVES D-DIMER ([facility] ONLY)  FERRITIN  POC SARS CORONAVIRUS 2 AG -  ED   ____________________________________________   EKG  Interpreted by me Sinus tachycardia rate 102.  Normal axis and intervals.  Normal QRS ST segments and T waves.  ____________________________________________    RADIOLOGY  No results found.  ____________________________________________   PROCEDURES .Critical Care Performed by: Sharman Cheek, MD Authorized by: Sharman Cheek, MD   Critical care provider statement:    Critical care time (minutes):  35   Critical care time was exclusive of:  Separately billable procedures and treating other patients   Critical care was necessary to treat or prevent imminent or life-threatening deterioration of the following conditions:  Sepsis and CNS failure or compromise   Critical care was time spent personally by me on the following activities:  Development of treatment plan with patient or surrogate, discussions with consultants, evaluation of patient's response to treatment, examination of patient, obtaining history from patient or surrogate, ordering and performing treatments and interventions, ordering and review of laboratory studies, ordering and review of radiographic studies, pulse oximetry, re-evaluation of patient's condition and review of old charts    ____________________________________________  DIFFERENTIAL DIAGNOSIS   UTI, pneumonia,  COVID-19, other viral syndrome, sepsis, encephalitis, decompensated schizophrenia, dehydration  CLINICAL IMPRESSION / ASSESSMENT AND PLAN / ED COURSE  Medications ordered in the ED: Medications  metroNIDAZOLE (FLAGYL) IVPB 500 mg (500 mg Intravenous New Bag/Given 09/26/19 1255)  LORazepam (ATIVAN) injection 1 mg (has  no administration in time range)  sodium chloride 0.9 % bolus 1,000 mL (1,000 mLs Intravenous New Bag/Given 09/26/19 1220)  ceFEPIme (MAXIPIME) 2 g in sodium chloride 0.9 % 100 mL IVPB (0 g Intravenous Stopped 09/26/19 1256)  vancomycin (VANCOCIN) IVPB 1000 mg/200 mL premix (1,000 mg Intravenous New Bag/Given 09/26/19 1226)  ziprasidone (GEODON) injection 10 mg (10 mg Intramuscular Given 09/26/19 1143)    Pertinent labs & imaging results that were available during my care of the patient were reviewed by me and considered in my medical decision making (see chart for details).   [name] was evaluated in Emergency Department on 09/26/2019 for the symptoms described in the history of present illness. She was evaluated in the context of the global COVID-19 pandemic, which necessitated consideration that the patient might be at risk for infection with the SARS-CoV-2 virus that causes COVID-19. Institutional protocols and algorithms that pertain to the evaluation of patients at risk for COVID-19 are in a state of rapid change based on information released by regulatory bodies including the CDC and federal and state organizations. These policies and algorithms were followed during the patient's care in the ED.   Patient presents with altered mental status and fever.  She does not have meningismus on exam, no evidence of skin or soft tissue infection or trauma.  Due to the patient's confusion and agitation, she was given intramuscular Geodon to calm her and facilitate medical work-up.  No signs of shock currently but with tachycardia and fever, sepsis work-up is being undertaken.  Clinical  Course as of Sep 25 1326  Wed Sep 26, 2019  1257 UA consistent with a UTI.  Point-of-care Covid test was negative, patient's sepsis presentation appears to be due to urinary tract infection.  Given age and comorbidities and current residential arrangement, I will plan to hospitalize until improved.  No hypotension, normal lactate, no signs of shock.  Urinalysis, Complete w Microscopic(!) [PS]    Clinical Course User Index [PS] Carrie Mew, MD     ----------------------------------------- 1:28 PM on 09/26/2019 -----------------------------------------  Chest x-ray image viewed by me, overall unremarkable without pneumothorax, pulmonary edema, or focal consolidation.  ____________________________________________   FINAL CLINICAL IMPRESSION(S) / ED DIAGNOSES    Final diagnoses:  Cystitis  Confusion  Schizophrenia, unspecified type [facility])     ED Discharge Orders    None      Portions of this note were generated with dragon dictation software. Dictation errors may occur despite best attempts at proofreading.   Carrie Mew, MD 09/26/19 1329

## 2019-09-27 DIAGNOSIS — R410 Disorientation, unspecified: Secondary | ICD-10-CM

## 2019-09-27 DIAGNOSIS — I10 Essential (primary) hypertension: Secondary | ICD-10-CM

## 2019-09-27 LAB — COMPREHENSIVE METABOLIC PANEL
ALT: 19 U/L (ref 0–44)
AST: 22 U/L (ref 15–41)
Albumin: 3.1 g/dL — ABNORMAL LOW (ref 3.5–5.0)
Alkaline Phosphatase: 60 U/L (ref 38–126)
Anion gap: 8 (ref 5–15)
BUN: 11 mg/dL (ref 8–23)
CO2: 26 mmol/L (ref 22–32)
Calcium: 8.9 mg/dL (ref 8.9–10.3)
Chloride: 110 mmol/L (ref 98–111)
Creatinine, Ser: 0.43 mg/dL — ABNORMAL LOW (ref 0.44–1.00)
GFR calc Af Amer: >60 mL/min (ref >60)
GFR calc non Af Amer: >60 mL/min (ref >60)
Glucose, Bld: 183 mg/dL — ABNORMAL HIGH (ref 70–99)
Potassium: 3.6 mmol/L (ref 3.5–5.1)
Sodium: 144 mmol/L (ref 135–145)
Total Bilirubin: 0.5 mg/dL (ref 0.3–1.2)
Total Protein: 6.9 g/dL (ref 6.5–8.1)

## 2019-09-27 LAB — CBC
HCT: 37.9 % (ref 36.0–46.0)
Hemoglobin: 12 g/dL (ref 12.0–15.0)
MCH: 31.8 pg (ref 26.0–34.0)
MCHC: 31.7 g/dL (ref 30.0–36.0)
MCV: 100.5 fL — ABNORMAL HIGH (ref 80.0–100.0)
Platelets: 243 10*3/uL (ref 150–400)
RBC: 3.77 MIL/uL — ABNORMAL LOW (ref 3.87–5.11)
RDW: 13.2 % (ref 11.5–15.5)
WBC: 7.9 10*3/uL (ref 4.0–10.5)
nRBC: 0 % (ref 0.0–0.2)

## 2019-09-27 LAB — HIGH SENSITIVITY CRP: CRP, High Sensitivity: 63.73 mg/L — ABNORMAL HIGH (ref 0.00–3.00)

## 2019-09-27 LAB — GLUCOSE, CAPILLARY
Glucose-Capillary: 146 mg/dL — ABNORMAL HIGH (ref 70–99)
Glucose-Capillary: 69 mg/dL — ABNORMAL LOW (ref 70–99)
Glucose-Capillary: 126 mg/dL — ABNORMAL HIGH (ref 70–99)
Glucose-Capillary: 137 mg/dL — ABNORMAL HIGH (ref 70–99)

## 2019-09-27 MED ORDER — DEXTROSE 50 % IV SOLN
INTRAVENOUS | Status: AC
  Administered 2019-09-27: 16:00:00 50 mL via INTRAVENOUS
  Filled 2019-09-27: qty 50

## 2019-09-27 MED ORDER — PHENYTOIN SODIUM EXTENDED 100 MG PO CAPS
100.0000 mg | ORAL_CAPSULE | Freq: Two times a day (BID) | ORAL | Status: DC
  Administered 2019-09-27 – 2019-09-28 (×2): 100 mg via ORAL
  Filled 2019-09-27 (×3): qty 1

## 2019-09-27 MED ORDER — LACOSAMIDE 200 MG PO TABS
200.0000 mg | ORAL_TABLET | Freq: Two times a day (BID) | ORAL | Status: DC
  Administered 2019-09-27 – 2019-09-28 (×2): 200 mg via ORAL
  Filled 2019-09-27 (×2): qty 1

## 2019-09-27 MED ORDER — LEVETIRACETAM 500 MG PO TABS
1000.0000 mg | ORAL_TABLET | Freq: Two times a day (BID) | ORAL | Status: DC
  Administered 2019-09-27 – 2019-09-28 (×2): 1000 mg via ORAL
  Filled 2019-09-27 (×2): qty 2

## 2019-09-27 MED ORDER — ATORVASTATIN CALCIUM 20 MG PO TABS: 40.0000 mg | ORAL_TABLET | Freq: Every evening | ORAL | Status: DC

## 2019-09-27 MED ORDER — ASPIRIN EC 81 MG PO TBEC
81.0000 mg | DELAYED_RELEASE_TABLET | Freq: Every day | ORAL | Status: DC
  Administered 2019-09-28: 10:00:00 81 mg via ORAL
  Filled 2019-09-27: qty 1

## 2019-09-27 MED ORDER — BENZTROPINE MESYLATE 0.5 MG PO TABS
0.5000 mg | ORAL_TABLET | Freq: Two times a day (BID) | ORAL | Status: DC
  Administered 2019-09-27 – 2019-09-28 (×2): 0.5 mg via ORAL
  Filled 2019-09-27 (×4): qty 1

## 2019-09-27 MED ORDER — FLUPHENAZINE HCL 5 MG PO TABS
5.0000 mg | ORAL_TABLET | Freq: Two times a day (BID) | ORAL | Status: DC
  Administered 2019-09-27 – 2019-09-28 (×2): 5 mg via ORAL
  Filled 2019-09-27 (×3): qty 1

## 2019-09-27 MED ORDER — LAMOTRIGINE 100 MG PO TABS
200.0000 mg | ORAL_TABLET | Freq: Two times a day (BID) | ORAL | Status: DC
  Administered 2019-09-27 – 2019-09-28 (×2): 200 mg via ORAL
  Filled 2019-09-27 (×2): qty 2

## 2019-09-27 MED ORDER — FAMOTIDINE 20 MG PO TABS
20.0000 mg | ORAL_TABLET | Freq: Every day | ORAL | Status: DC
  Administered 2019-09-27: 22:00:00 20 mg via ORAL
  Filled 2019-09-27: qty 1

## 2019-09-27 MED ORDER — METOPROLOL TARTRATE 50 MG PO TABS
50.0000 mg | ORAL_TABLET | Freq: Two times a day (BID) | ORAL | Status: DC
  Administered 2019-09-27 – 2019-09-28 (×2): 50 mg via ORAL
  Filled 2019-09-27 (×2): qty 1

## 2019-09-27 MED ORDER — THIAMINE HCL 100 MG PO TABS
100.0000 mg | ORAL_TABLET | Freq: Every day | ORAL | Status: DC
  Administered 2019-09-28: 100 mg via ORAL
  Filled 2019-09-27: qty 1

## 2019-09-27 NOTE — Progress Notes (Signed)
Pt BG 69 12.5mg  D5 given IV per hypoglycemic protocol will recheck BG in 15 minutes

## 2019-09-27 NOTE — Progress Notes (Signed)
BG 126 after admin D50 12.5mg , pt continues to be aggressive and verbally abusive, Ativan 1mg  given with some relief, pt refused all po medications, also refused any food or drinks, will cont to monitor.

## 2019-09-27 NOTE — TOC Initial Note (Signed)
Transition of Care [facility]) - Initial/Assessment Note    Patient Details  Name: [name] MRN: [id number] Date of Birth: Jul 05, 1946  Transition of Care [facility]) CM/SW Contact:    Trenton Founds, RN Phone Number: 09/27/2019, 10:12 AM  Clinical Narrative:  RNCM placed call to ALF administrator for assessment and spoke with [facility]. Leanne Chang reports that patient does in fact reside in facility and it will be no problem for her to return there at discharge. Leanne Chang reports that patient is normally wheel chair bound and total care. Discussed patient's current cognitive level and Leanne Chang reports that she has her good days and bad days, she thinks due mainly in part to her hx of Schizophrenia. CM will continue to monitor for any problems or concerns.          Expected Discharge Plan: Assisted Living Barriers to Discharge: Continued Medical Work up   Patient Goals and CMS Choice        Expected Discharge Plan and Services Expected Discharge Plan: Assisted Living   Discharge Planning Services: CM Consult   Living arrangements for the past 2 months: Assisted Living Facility                                      Prior Living Arrangements/Services Living arrangements for the past 2 months: Assisted Living Facility Lives with:: Facility Resident              Current home services: Homehealth aide    Activities of Daily Living Home Assistive Devices/Equipment: Wheelchair ADL Screening (condition at time of admission) Patient's cognitive ability adequate to safely complete daily activities?: No Is the patient deaf or have difficulty hearing?: No Does the patient have difficulty seeing, even when wearing glasses/contacts?: No Does the patient have difficulty concentrating, remembering, or making decisions?: Yes Patient able to express need for assistance with ADLs?: No Does the patient have difficulty dressing or bathing?: Yes Independently performs ADLs?: No Communication:  Independent Dressing (OT): Needs assistance Is this a change from baseline?: Pre-admission baseline Grooming: Needs assistance Is this a change from baseline?: Pre-admission baseline Feeding: Needs assistance Is this a change from baseline?: Pre-admission baseline Bathing: Needs assistance Is this a change from baseline?: Pre-admission baseline Toileting: Needs assistance Is this a change from baseline?: Pre-admission baseline In/Out Bed: Needs assistance Is this a change from baseline?: Pre-admission baseline Walks in Home: Needs assistance Is this a change from baseline?: Pre-admission baseline Does the patient have difficulty walking or climbing stairs?: Yes Weakness of Legs: Both Weakness of Arms/Hands: Both  Permission Sought/Granted                  Emotional Assessment Appearance:: (assessment completed by phone with group home staff due to cogntive deficits)       Alcohol / Substance Use: Never Used Psych Involvement: No (comment)  Admission diagnosis:  Acute delirium [R41.0] Confusion [R41.0] Cystitis [N30.90] Schizophrenia, unspecified type (HCC) [F20.9] Patient Active Problem List   Diagnosis Date Noted  . Acute delirium 09/26/2019  . Abnormal urinalysis 09/26/2019  . Person under investigation for COVID-19 09/26/2019  . Asthma   . Chronic diastolic heart failure (HCC)   . Schizophrenia (HCC)   . Hyperlipidemia 11/03/2016  . Left-sided weakness 11/03/2016  . Dementia (HCC) 11/03/2016  . 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 TIA (transient ischemic attack) 11/02/2016  . Bronchiectasis without acute exacerbation (HCC) 10/17/2013  . Seizures (HCC) 09/19/2013  .  Encounter for long-term (current) use of other medications 09/19/2013  . Hypercalcemia 09/19/2013  . Atrial fibrillation ([place]) 08/14/2013  . Congestive heart failure, unspecified 08/14/2013  . Hypopotassemia 08/14/2013  . Seizure disorder ([place]) 07/14/2013  . Schizophrenia, chronic condition ([place])  07/14/2013  . Hypertension 07/14/2013  . Sepsis ([place]) 07/14/2013  . UTI (urinary tract infection) 07/14/2013  . Altered mental status 04/01/2013  . Anemia 03/31/2013  . Status epilepticus ([place]) 03/28/2013  . Acute respiratory failure ([place]) 03/28/2013  . Diabetes mellitus without complication ([place]) 78/29/5621   PCP:  Leeroy Cha, MD Pharmacy:  No Pharmacies Listed    Social Determinants of Health (SDOH) Interventions    Readmission Risk Interventions No flowsheet data found.

## 2019-09-27 NOTE — Progress Notes (Signed)
[facility]  - [place] [facility]   PATIENT NAME: [name]    MR#:  [id number]  DATE OF BIRTH:  March 22, 1946  SUBJECTIVE:  Pt has baseline schizophrenia--using cuss words. Ate good BF this am per RN yelling for RN No fever, sob   REVIEW OF SYSTEMS:   Review of Systems  Unable to perform ROS: Psychiatric disorder   Tolerating Diet:yes Tolerating PT:   DRUG ALLERGIES:   Allergies  Allergen Reactions  . Depakote [Valproic Acid] Other (See Comments)    unknown  . Haldol [Haloperidol Lactate] Other (See Comments)    Unknown   . Penicillins Other (See Comments)    Unknown   . Shellfish Allergy Other (See Comments)    unknown  . Trileptal [Oxcarbazepine] Other (See Comments)    unknown    VITALS:  Blood pressure (!) 174/74, pulse 86, temperature 99.9 F (37.7 C), temperature source Oral, resp. rate 17, height 5\' 8"  (1.727 m), weight 64.2 kg, SpO2 97 %.  PHYSICAL EXAMINATION:   Physical Examlimited exam--pt uncooperative  GENERAL:  74 y.o.-year-old patient lying in the bed with no acute distress.  HEENT: Head atraumatic, normocephalic. Oropharynx and nasopharynx clear.  LUNGS: Normal breath sounds bilaterally, no wheezing, rales, rhonchi. No use of accessory muscles of respiration.  CARDIOVASCULAR: S1, S2 normal. No murmurs, rubs, or gallops.  ABDOMEN: Soft, nontender, nondistended. Bowel sounds present. No organomegaly or mass.  EXTREMITIES: No cyanosis, clubbing or edema b/l.    NEUROLOGIC: non focal grossly. Moves all extremities well PSYCHIATRIC:  patient is alert   LABORATORY PANEL:  CBC Recent Labs  Lab 09/27/19 0737  WBC 7.9  HGB 12.0  HCT 37.9  PLT 243    Chemistries  Recent Labs  Lab 09/27/19 0737  NA 144  K 3.6  CL 110  CO2 26  GLUCOSE 183*  BUN 11  CREATININE 0.43*  CALCIUM 8.9  AST 22  ALT 19  ALKPHOS 60  BILITOT 0.5   Cardiac Enzymes No results for input(s): TROPONINI in the last 168 hours. RADIOLOGY:   CT HEAD WO CONTRAST  Result Date: 09/26/2019 CLINICAL DATA:  Delirium. Additional history provided: Fever, history of dementia and schizophrenia, agitation EXAM: CT HEAD WITHOUT CONTRAST TECHNIQUE: Contiguous axial images were obtained from the base of the skull through the vertex without intravenous contrast. COMPARISON:  Head CT 08/21/2017 FINDINGS: Brain: No evidence of acute intracranial hemorrhage. No demarcated cortical infarction. No evidence of intracranial mass. No midline shift or extra-axial fluid collection. Redemonstrated chronic lacunar infarct within the right basal ganglia. Background mild ill-defined hypoattenuation within the cerebral white matter is nonspecific, but consistent with chronic small vessel ischemic disease. Mild generalized parenchymal atrophy. Vascular: No hyperdense vessel. Skull: Normal. Negative for fracture or focal lesion. Sinuses/Orbits: Visualized orbits demonstrate no acute abnormality. Minimal right maxillary sinus mucosal thickening. No significant mastoid effusion. IMPRESSION: No evidence of acute intracranial abnormality. Mild generalized parenchymal atrophy and chronic small vessel ischemic disease. Redemonstrated chronic right basal ganglia lacunar infarct. Electronically Signed   By: 08/23/2017 DO   On: 09/26/2019 15:17   DG Chest Port 1 View  Result Date: 09/26/2019 CLINICAL DATA:  Fever with altered mental status EXAM: PORTABLE CHEST 1 VIEW COMPARISON:  July 11, 2013 FINDINGS: Lungs are clear. Heart size and pulmonary vascularity are normal. No adenopathy. No bone lesions. IMPRESSION: Lungs clear.  Cardiac silhouette normal.  No adenopathy. Electronically Signed   By: July 13, 2013 III M.D.   On: 09/26/2019 13:31  ASSESSMENT AND PLAN:  74 year old lady with past medical history of hypertension, hyperlipidemia, diabetes mellitus, asthma, TIA, anxiety, seizure, dementia, schizophrenia, hepatitis C, hepatitis B, hepatitis A, dCHF, tobacco abuse,  who presents with fever, altered mental status and agitation. Per report, pt was noted to have AMS and agitation in SNF. She has fever of 101.3  Febrile illness due to possible UTI: Urinalysis consistent with UTI. -IV rocephin  1 g daily -Blood culture negative and urine cultures -- re-incubated for better growth -received  D5 normal saline at 100 cc/h-- patient is eating well. Discontinue fluids -COVID-19 PCR negative -x-ray negative for pneumonia -Pro calcitonin less than 0.10 -CRP 63, ferritin 267  Acute metabolic encephalopathy in the setting of dementia, chronic schizophrenia:  -CT head is negative for acute intracranial abnormalities. -  No focal neurologic findings on physical examination.  May be due to UTI. -neuro- exam grossly intact - ammonia 30 -patient's mentation appears improved than yesterday. She is following commands. Ate good breakfast fed by nurse.  Diet controlled diabetes mellitus without complication ([place]): recent A1c 6.7. well controlled. Blood sugar 161 -SSI  Hypertension -resume home metoprolol  Chronic diastolic heart failure ([place]): - 2D echo on 11/03/2016 showed EF 60-65% with grade 1 diastolic dysfunction.    CHF is compensated. -Not on diuretics prior to admission  chronic seizuredisorder(HCC):  -Patient on multiple medicationsincluding Vimpat, Keppra, Lamictal, phenytoin-- resume oral meds -prn IV Ativan  Asthma -History of prior tobacco abuse with apparent ongoing smoking -Currently respiratory status appears to be stable and has normal chest x-ray    Procedures: none Family communication :Kalman Drape legal guardian today Contact for her supervisor Steva Ready [phone] Consults :none Discharge Disposition : back to family care home CODE STATUS: full code per legal guardian DVT Prophylaxis : Lovenox Barriers to discharge: treatment for UTI , pending urine culture anticipate discharge tomorrow if patient remains stable  TOTAL TIME  TAKING CARE OF THIS PATIENT: *30* minutes.  >50% time spent on counselling and coordination of care  Note: This dictation was prepared with Dragon dictation along with smaller phrase technology. Any transcriptional errors that result from this process are unintentional.  Fritzi Mandes M.D    [facility]   CC: Primary care physician; No primary care provider on file.Patient ID: Damien Fusi, female   DOB: [birth year], 74 y.o.   MRN: [id number]

## 2019-09-28 LAB — GLUCOSE, CAPILLARY
Glucose-Capillary: 99 mg/dL (ref 70–99)
Glucose-Capillary: 121 mg/dL — ABNORMAL HIGH (ref 70–99)
Glucose-Capillary: 93 mg/dL (ref 70–99)

## 2019-09-28 LAB — URINE CULTURE: Culture: >=100000 — AB

## 2019-09-28 MED ORDER — CEPHALEXIN 500 MG PO CAPS: 500.0000 mg | ORAL_CAPSULE | Freq: Four times a day (QID) | ORAL | 0 refills | Status: AC

## 2019-09-28 MED ORDER — CEPHALEXIN 500 MG PO CAPS
500.0000 mg | ORAL_CAPSULE | Freq: Four times a day (QID) | ORAL | Status: DC
  Administered 2019-09-28 (×2): 500 mg via ORAL
  Filled 2019-09-28 (×2): qty 1

## 2019-09-28 MED ORDER — CEPHALEXIN 500 MG PO CAPS: 500.0000 mg | ORAL_CAPSULE | Freq: Four times a day (QID) | ORAL | 0 refills | Status: DC

## 2019-09-28 NOTE — Discharge Instructions (Addendum)
Pt will f/u with her PCP in 1 week

## 2019-09-28 NOTE — TOC Transition Note (Signed)
Transition of Care [facility]) - CM/SW Discharge Note   Patient Details  [name] MRN: [id number] Date of Birth: 01-10-1946  Transition of Care [facility]) CM/SW Contact:  Trenton Founds, RN Phone Number: 09/28/2019, 2:52 PM   Clinical Narrative:   RNCM notified by MD that patient was ready to return to facility, ready to be discharged. Placed call to [facility], Production designer, theatre/television/film of facility as well as Isabel Caprice, patient's guardian supervisor to make aware of patient discharge. Was able to speak with [facility] and give her report, had to leave VM for [facility]. EMS packet and FL2 completed and patient's bedside nurse made aware she was ready for discharge.     Final next level of care: Assisted Living Barriers to Discharge: Continued Medical Work up   Patient Goals and CMS Choice        Discharge Placement                Patient to be transferred to facility by: [facility] [name] of family member notified: Left VM for Isabel Caprice to inform of patient discharge. Patient and family notified of of transfer: 09/28/19  Discharge Plan and Services   Discharge Planning Services: CM Consult                                 Social Determinants of Health (SDOH) Interventions     Readmission Risk Interventions No flowsheet data found.

## 2019-09-28 NOTE — Discharge Summary (Addendum)
[facility] [place] [facility]   PATIENT NAME: [name]    MR#:  [id number]  DATE OF BIRTH:  1946-02-13  DATE OF ADMISSION:  09/26/2019 ADMITTING PHYSICIAN: Lorretta Harp, MD  DATE OF DISCHARGE: 09/28/2019  PRIMARY CARE PHYSICIAN: Marguarite Arbour, MD    ADMISSION DIAGNOSIS:  Acute delirium [R41.0] Confusion [R41.0] Cystitis [N30.90] Schizophrenia, unspecified type (HCC) [F20.9]  DISCHARGE DIAGNOSIS:  UTI  SECONDARY DIAGNOSIS:   Past Medical History:  Diagnosis Date  . Asthma   . CHF (congestive heart failure) (HCC)   . Diabetes mellitus without complication (HCC)   . Hepatitis A   . Hepatitis B   . Hepatitis C   . Hypertension   . Respiratory failure (HCC)   . Schizophrenia (HCC)   . Seizures (HCC)   . Smoker    1 pack daily    HOSPITAL COURSE:   74 year old lady with past medical history of hypertension, hyperlipidemia, diabetes mellitus, asthma, TIA, anxiety, seizure, dementia, schizophrenia, hepatitis C, hepatitis B, hepatitis A, dCHF, tobacco abuse, who presents with fever, altered mental status and agitation. Per report, pt was noted to have AMS and agitation in SNF. She has fever of 101.3  Febrile illness due to possible UTI: -Urinalysis consistent with UTI. -IV rocephin  1 g daily--change to oral keflex (total 5 days) -Blood culture negative and urine cultures --Aerococus Viridans --d/w ID pharm and keflex should be ok -received  D5 normal saline at 100 cc/h-- patient is eating well. Discontinued fluids -COVID-19 PCR negative -x-ray negative for pneumonia -Pro calcitonin less than 0.10 -CRP 63, ferritin 129  Acute metabolic encephalopathy in the setting of dementia, chronic schizophrenia:  -CT head is negative for acute intracranial abnormalities. - No focal neurologic findings on physical examination. May be due to UTI. -neuro- exam grossly intact - ammonia 30 -patient's mentation appears improved than yesterday. She is  following commands.  -most of her behaviour is due to her psych problem  Diet controlled diabetes mellitus without complication (HCC): recent A1c 6.7. well controlled.  -SSI  Hypertension -resume home metoprolol  Chronic diastolic heart failure (HCC): - 2D echo on 11/03/2016 showed EF 60-65% with grade 1 diastolic dysfunction.  CHF is compensated. -Not on diuretics prior to admission  chronic seizuredisorder(HCC):  -Patient on multiple medicationsincluding Vimpat, Keppra, Lamictal, phenytoin-- resume oral meds -prn IV Ativan  Asthma -History of prior tobacco abuse with apparent ongoing smoking -Currently respiratory status appears to be stable and has normal chest x-ray  Procedures: none Family communication :Saintclair Halsted legal guardian Contact for her supervisor Pearson Grippe [phone] Consults :none Discharge Disposition : back to family care home CODE STATUS: full code per legal guardian DVT Prophylaxis : Lovenox Barriers to discharge:none  D/c to family care home today CONSULTS OBTAINED:    DRUG ALLERGIES:   Allergies  Allergen Reactions  . Depakote [Valproic Acid] Other (See Comments)    unknown  . Haldol [Haloperidol Lactate] Other (See Comments)    Unknown   . Penicillins Other (See Comments)    Unknown   . Shellfish Allergy Other (See Comments)    unknown  . Trileptal [Oxcarbazepine] Other (See Comments)    unknown    DISCHARGE MEDICATIONS:   Allergies as of 09/28/2019      Reactions   Depakote [valproic Acid] Other (See Comments)   unknown   Haldol [haloperidol Lactate] Other (See Comments)   Unknown   Penicillins Other (See Comments)   Unknown   Shellfish Allergy Other (See Comments)  unknown   Trileptal [oxcarbazepine] Other (See Comments)   unknown      Medication List    TAKE these medications   acetaminophen 500 MG tablet Commonly known as: TYLENOL Take 500 mg by mouth every 6 (six) hours as needed for pain.   aspirin EC 81 MG  tablet Take 81 mg by mouth daily.   atorvastatin 40 MG tablet Commonly known as: LIPITOR Take 1 tablet (40 mg total) by mouth daily at 6 PM. What changed: when to take this   B-complex with vitamin C tablet Take 1 tablet by mouth daily.   BAZA EX Apply topically See admin instructions. Apply barrier cream topically to right buttock lesion after each clean   benztropine 0.5 MG tablet Commonly known as: COGENTIN Take 0.5 mg by mouth 2 (two) times daily.   calcium carbonate 500 MG chewable tablet Commonly known as: TUMS - dosed in mg elemental calcium Chew 1 tablet by mouth 2 (two) times daily.   cephALEXin 500 MG capsule Commonly known as: KEFLEX Take 1 capsule (500 mg total) by mouth every 6 (six) hours for 3 days.   cyclobenzaprine 5 MG tablet Commonly known as: FLEXERIL Take 5 mg by mouth 2 (two) times daily as needed for muscle spasms.   famotidine 20 MG tablet Commonly known as: PEPCID Take 20 mg by mouth at bedtime.   fluPHENAZine 5 MG tablet Commonly known as: PROLIXIN Take 5 mg by mouth 2 (two) times daily. At 1400 and 2000   fluPHENAZine decanoate 25 MG/ML injection Commonly known as: PROLIXIN Inject into the muscle every 14 (fourteen) days.   lacosamide 200 MG Tabs tablet Commonly known as: VIMPAT Take one tablet by mouth twice daily   lamoTRIgine 200 MG tablet Commonly known as: LAMICTAL Take 200 mg by mouth 2 (two) times daily.   levETIRAcetam 1000 MG tablet Commonly known as: KEPPRA Take 1 tablet (1,000 mg total) by mouth 2 (two) times daily.   metoprolol tartrate 50 MG tablet Commonly known as: LOPRESSOR Take 50 mg by mouth 2 (two) times daily. Do not administer if pulse <50   oxybutynin 5 MG tablet Commonly known as: DITROPAN Take 5 mg by mouth 2 (two) times daily.   phenytoin 100 MG ER capsule Commonly known as: DILANTIN Take 100 mg by mouth 2 (two) times daily.   polyethylene glycol 17 g packet Commonly known as: MIRALAX /  GLYCOLAX Take 17 g by mouth daily.   thiamine 100 MG tablet Commonly known as: VITAMIN B-1 Take 100 mg by mouth daily.       If you experience worsening of your admission symptoms, develop shortness of breath, life threatening emergency, suicidal or homicidal thoughts you must seek medical attention immediately by calling 911 or calling your MD immediately  if symptoms less severe.  You Must read complete instructions/literature along with all the possible adverse reactions/side effects for all the Medicines you take and that have been prescribed to you. Take any new Medicines after you have completely understood and accept all the possible adverse reactions/side effects.   Please note  You were cared for by a hospitalist during your hospital stay. If you have any questions about your discharge medications or the care you received while you were in the hospital after you are discharged, you can call the unit and asked to speak with the hospitalist on call if the hospitalist that took care of you is not available. Once you are discharged, your primary care physician will handle  any further medical issues. Please note that NO REFILLS for any discharge medications will be authorized once you are discharged, as it is imperative that you return to your primary care physician (or establish a relationship with a primary care physician if you do not have one) for your aftercare needs so that they can reassess your need for medications and monitor your lab values. Today   SUBJECTIVE   Resting at present. Per RN--some agitation last nite. No fever  VITAL SIGNS:  Blood pressure (!) 146/65, pulse 93, temperature (!) 97.5 F (36.4 C), temperature source Oral, resp. rate 17, height 5\' 8"  (1.727 m), weight 64.2 kg, SpO2 97 %.  I/O:    Intake/Output Summary (Last 24 hours) at 09/28/2019 1343 Last data filed at 09/28/2019 1034 Gross per 24 hour  Intake --  Output 1000 ml  Net -1000 ml    PHYSICAL  EXAMINATION:  Physical Examlimited exam--pt uncooperative  GENERAL:  73 y.o.-year-old patient lying in the bed with no acute distress.  HEENT: Head atraumatic, normocephalic. Oropharynx and nasopharynx clear.  LUNGS: Normal breath sounds bilaterally, no wheezing, rales, rhonchi. No use of accessory muscles of respiration.  CARDIOVASCULAR: S1, S2 normal. No murmurs, rubs, or gallops.  ABDOMEN: Soft, nontender, nondistended. Bowel sounds present. No organomegaly or mass.  EXTREMITIES: No cyanosis, clubbing or edema b/l.    NEUROLOGIC: non focal grossly. Moves all extremities well PSYCHIATRIC:  patient is alert, resting  DATA REVIEW:   CBC  Recent Labs  Lab 09/27/19 0737  WBC 7.9  HGB 12.0  HCT 37.9  PLT 243    Chemistries  Recent Labs  Lab 09/27/19 0737  NA 144  K 3.6  CL 110  CO2 26  GLUCOSE 183*  BUN 11  CREATININE 0.43*  CALCIUM 8.9  AST 22  ALT 19  ALKPHOS 60  BILITOT 0.5    Microbiology Results   Recent Results (from the past 240 hour(s))  Blood Culture (routine x 2)     Status: None (Preliminary result)   Collection Time: 09/26/19 12:11 PM   Specimen: BLOOD  Result Value Ref Range Status   Specimen Description BLOOD BLOOD LEFT HAND  Final   Special Requests   Final    BOTTLES DRAWN AEROBIC AND ANAEROBIC Blood Culture adequate volume   Culture   Final    NO GROWTH 2 DAYS Performed at [facility], [address] [place], [place] [address]    Report Status PENDING  Incomplete  Urine culture     Status: Abnormal   Collection Time: 09/26/19 12:11 PM   Specimen: In/Out Cath Urine  Result Value Ref Range Status   Specimen Description   Final    IN/OUT CATH URINE Performed at [facility], [address], [place], [place] [address]    Special Requests   Final    NONE Performed at [facility], [address], [place], [place] [address]    Culture >=100,000 COLONIES/mL AEROCOCCUS VIRIDANS (A)  Final   Report Status  09/28/2019 FINAL  Final  Blood Culture (routine x 2)     Status: None (Preliminary result)   Collection Time: 09/26/19 12:12 PM   Specimen: BLOOD  Result Value Ref Range Status   Specimen Description BLOOD BLOOD RIGHT FOREARM  Final   Special Requests   Final    BOTTLES DRAWN AEROBIC AND ANAEROBIC Blood Culture results may not be optimal due to an inadequate volume of blood received in culture bottles   Culture  Final    NO GROWTH 2 DAYS Performed at [facility], [address] [place], [place] [address]    Report Status PENDING  Incomplete  Respiratory Panel by RT PCR (Flu A&B, Covid) - Nasopharyngeal Swab     Status: None   Collection Time: 09/26/19 12:56 PM   Specimen: Nasopharyngeal Swab  Result Value Ref Range Status   SARS Coronavirus 2 by RT PCR NEGATIVE NEGATIVE Final    Comment: (NOTE) SARS-CoV-2 target nucleic acids are NOT DETECTED. The SARS-CoV-2 RNA is generally detectable in upper respiratoy specimens during the acute phase of infection. The lowest concentration of SARS-CoV-2 viral copies this assay can detect is 131 copies/mL. A negative result does not preclude SARS-Cov-2 infection and should not be used as the sole basis for treatment or other patient management decisions. A negative result may occur with  improper specimen collection/handling, submission of specimen other than nasopharyngeal swab, presence of viral mutation(s) within the areas targeted by this assay, and inadequate number of viral copies (<131 copies/mL). A negative result must be combined with clinical observations, patient history, and epidemiological information. The expected result is Negative. Fact Sheet for Patients:  https://www.moore.com/https://www.fda.gov/media/142436/download Fact Sheet for Healthcare Providers:  https://www.young.biz/https://www.fda.gov/media/142435/download This test is not yet ap proved or cleared by the [place] FDA and  has been authorized for detection and/or diagnosis of SARS-CoV-2  by FDA under an Emergency Use Authorization (EUA). This EUA will remain  in effect (meaning this test can be used) for the duration of the COVID-19 declaration under Section 564(b)(1) of the Act, 21 U.S.C. section 360bbb-3(b)(1), unless the authorization is terminated or revoked sooner.    Influenza A by PCR NEGATIVE NEGATIVE Final   Influenza B by PCR NEGATIVE NEGATIVE Final    Comment: (NOTE) The Xpert Xpress SARS-CoV-2/FLU/RSV assay is intended as an aid in  the diagnosis of influenza from Nasopharyngeal swab specimens and  should not be used as a sole basis for treatment. Nasal washings and  aspirates are unacceptable for Xpert Xpress SARS-CoV-2/FLU/RSV  testing. Fact Sheet for Patients: https://www.moore.com/https://www.fda.gov/media/142436/download Fact Sheet for Healthcare Providers: https://www.young.biz/https://www.fda.gov/media/142435/download This test is not yet approved or cleared by the [place] FDA and  has been authorized for detection and/or diagnosis of SARS-CoV-2 by  FDA under an Emergency Use Authorization (EUA). This EUA will remain  in effect (meaning this test can be used) for the duration of the  Covid-19 declaration under Section 564(b)(1) of the Act, 21  U.S.C. section 360bbb-3(b)(1), unless the authorization is  terminated or revoked. Performed at [facility], [address] [place], [place] [address]     RADIOLOGY:  CT HEAD WO CONTRAST  Result Date: 09/26/2019 CLINICAL DATA:  Delirium. Additional history provided: Fever, history of dementia and schizophrenia, agitation EXAM: CT HEAD WITHOUT CONTRAST TECHNIQUE: Contiguous axial images were obtained from the base of the skull through the vertex without intravenous contrast. COMPARISON:  Head CT 08/21/2017 FINDINGS: Brain: No evidence of acute intracranial hemorrhage. No demarcated cortical infarction. No evidence of intracranial mass. No midline shift or extra-axial fluid collection. Redemonstrated chronic lacunar infarct within the  right basal ganglia. Background mild ill-defined hypoattenuation within the cerebral white matter is nonspecific, but consistent with chronic small vessel ischemic disease. Mild generalized parenchymal atrophy. Vascular: No hyperdense vessel. Skull: Normal. Negative for fracture or focal lesion. Sinuses/Orbits: Visualized orbits demonstrate no acute abnormality. Minimal right maxillary sinus mucosal thickening. No significant mastoid effusion. IMPRESSION: No evidence of acute intracranial abnormality. Mild generalized parenchymal atrophy and  chronic small vessel ischemic disease. Redemonstrated chronic right basal ganglia lacunar infarct. Electronically Signed   By: Jackey Loge DO   On: 09/26/2019 15:17     CODE STATUS:     Code Status Orders  (From admission, onward)         Start     Ordered   09/26/19 1410  Full code  Continuous     09/26/19 1413        Code Status History    Date Active Date Inactive Code Status Order ID Comments User Context   11/02/2016 1708 11/03/2016 2048 Full Code [id number]  Milagros Loll, MD ED   07/05/2013 2018 07/16/2013 1813 Full Code [id number]  [name], MD Inpatient   03/28/2013 0433 04/02/2013 2108 Full Code [id number]  Lonia Farber, MD ED   Advance Care Planning Activity       TOTAL TIME TAKING CARE OF THIS PATIENT: *35* minutes.    Enedina Finner M.D  [facility]    CC: Primary care physician; Marguarite Arbour, MD

## 2019-09-28 NOTE — Progress Notes (Signed)
[facility].  Report called to [facility]. EMS report given. PIV d/c tip intact. Discharge instructions given.

## 2019-09-28 NOTE — NC FL2 (Signed)
[place] LEVEL OF CARE SCREENING TOOL     IDENTIFICATION  Patient Name: [name] Birthdate: 22-Dec-1945 Sex: female Admission Date (Current Location): 09/26/2019  [place] and [place] Number:  Engineering geologist and Address:  [facility], [address], [place], [place] [address]      Provider Number: [id number]  Attending Physician Name and Address:  Fritzi Mandes, MD  Relative Name and Phone Number:       Current Level of Care: Hospital Recommended Level of Care: [place] Prior Approval Number:    Date Approved/Denied:   PASRR Number:    Discharge Plan: Other (Comment)(Assisted Living Facility)    Current Diagnoses: Patient Active Problem List   Diagnosis Date Noted  . Acute delirium 09/26/2019  . Abnormal urinalysis 09/26/2019  . Person under investigation for COVID-19 09/26/2019  . Asthma   . Chronic diastolic heart failure ([place])   . Schizophrenia ([place])   . Hyperlipidemia 11/03/2016  . Left-sided weakness 11/03/2016  . Dementia ([place]) 11/03/2016  . 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 TIA (transient ischemic attack) 11/02/2016  . Bronchiectasis without acute exacerbation ([place]) 10/17/2013  . Seizures ([place]) 09/19/2013  . Encounter for long-term (current) use of other medications 09/19/2013  . Hypercalcemia 09/19/2013  . Atrial fibrillation ([place]) 08/14/2013  . Congestive heart failure, unspecified 08/14/2013  . Hypopotassemia 08/14/2013  . Seizure disorder ([place]) 07/14/2013  . Schizophrenia, chronic condition ([place]) 07/14/2013  . Hypertension 07/14/2013  . Sepsis ([place]) 07/14/2013  . UTI (urinary tract infection) 07/14/2013  . Altered mental status 04/01/2013  . Anemia 03/31/2013  . Status epilepticus ([place]) 03/28/2013  . Acute respiratory failure ([place]) 03/28/2013  . Diabetes mellitus without complication ([place]) 02/20/7627    Orientation RESPIRATION BLADDER Height & Weight        Normal Incontinent Weight:  64.2 kg Height:  5\' 8"  (172.7 cm)  BEHAVIORAL SYMPTOMS/MOOD NEUROLOGICAL BOWEL NUTRITION STATUS      Incontinent Diet(Regular)  AMBULATORY STATUS COMMUNICATION OF NEEDS Skin   Total Care Verbally Normal                       Personal Care Assistance Level of Assistance  Total care       Total Care Assistance: Maximum assistance   Functional Limitations Info             SPECIAL CARE FACTORS FREQUENCY                       Contractures Contractures Info: Not present    Additional Factors Info  Code Status, Allergies Code Status Info: Full Allergies Info: Depakote, Haldol, PCN, Shellfish, Trileptal           Medication List    TAKE these medications   acetaminophen 500 MG tablet Commonly known as: TYLENOL Take 500 mg by mouth every 6 (six) hours as needed for pain.   aspirin EC 81 MG tablet Take 81 mg by mouth daily.   atorvastatin 40 MG tablet Commonly known as: LIPITOR Take 1 tablet (40 mg total) by mouth daily at 6 PM. What changed: when to take this   B-complex with vitamin C tablet Take 1 tablet by mouth daily.   BAZA EX Apply topically See admin instructions. Apply barrier cream topically to right buttock lesion after each clean   benztropine 0.5 MG tablet Commonly known as: COGENTIN Take 0.5 mg by mouth 2 (two) times daily.   calcium carbonate  500 MG chewable tablet Commonly known as: TUMS - dosed in mg elemental calcium Chew 1 tablet by mouth 2 (two) times daily.   cephALEXin 500 MG capsule Commonly known as: KEFLEX Take 1 capsule (500 mg total) by mouth every 6 (six) hours for 3 days.   cyclobenzaprine 5 MG tablet Commonly known as: FLEXERIL Take 5 mg by mouth 2 (two) times daily as needed for muscle spasms.   famotidine 20 MG tablet Commonly known as: PEPCID Take 20 mg by mouth at bedtime.   fluPHENAZine 5 MG tablet Commonly known as: PROLIXIN Take 5 mg by mouth 2 (two) times daily. At 1400 and 2000    fluPHENAZine decanoate 25 MG/ML injection Commonly known as: PROLIXIN Inject into the muscle every 14 (fourteen) days.   lacosamide 200 MG Tabs tablet Commonly known as: VIMPAT Take one tablet by mouth twice daily   lamoTRIgine 200 MG tablet Commonly known as: LAMICTAL Take 200 mg by mouth 2 (two) times daily.   levETIRAcetam 1000 MG tablet Commonly known as: KEPPRA Take 1 tablet (1,000 mg total) by mouth 2 (two) times daily.   metoprolol tartrate 50 MG tablet Commonly known as: LOPRESSOR Take 50 mg by mouth 2 (two) times daily. Do not administer if pulse <50   oxybutynin 5 MG tablet Commonly known as: DITROPAN Take 5 mg by mouth 2 (two) times daily.   phenytoin 100 MG ER capsule Commonly known as: DILANTIN Take 100 mg by mouth 2 (two) times daily.   polyethylene glycol 17 g packet Commonly known as: MIRALAX / GLYCOLAX Take 17 g by mouth daily.   thiamine 100 MG tablet Commonly known as: VITAMIN B-1 Take 100 mg by mouth daily.    Relevant Imaging Results:  Relevant Lab Results:   Additional Information    Trenton Founds, RN

## 2019-09-28 NOTE — Care Management Important Message (Signed)
Important Message  Patient Details  Name: [name] MRN: [id number] Date of Birth: July 03, 1946   Medicare Important Message Given:  N/A - LOS <3 / Initial given by admissions     [name] 09/28/2019, 8:21 AM

## 2019-10-01 LAB — CULTURE, BLOOD (ROUTINE X 2)
Culture: NO GROWTH
Culture: NO GROWTH
Special Requests: ADEQUATE

## 2020-07-02 ENCOUNTER — Emergency Department
Admission: EM | Admit: 2020-07-02 | Discharge: 2020-07-03 | Disposition: A | Discharge status: Home / Self Care | Payer: Medicare Other | Attending: Emergency Medicine | Admitting: Emergency Medicine

## 2020-07-02 DIAGNOSIS — F0390 Unspecified dementia without behavioral disturbance: Secondary | ICD-10-CM | POA: Diagnosis not present

## 2020-07-02 DIAGNOSIS — I5032 Chronic diastolic (congestive) heart failure: Secondary | ICD-10-CM | POA: Diagnosis not present

## 2020-07-02 DIAGNOSIS — F17210 Nicotine dependence, cigarettes, uncomplicated: Secondary | ICD-10-CM | POA: Insufficient documentation

## 2020-07-02 DIAGNOSIS — E119 Type 2 diabetes mellitus without complications: Secondary | ICD-10-CM | POA: Diagnosis not present

## 2020-07-02 DIAGNOSIS — S0083XA Contusion of other part of head, initial encounter: Secondary | ICD-10-CM | POA: Diagnosis not present

## 2020-07-02 DIAGNOSIS — N3000 Acute cystitis without hematuria: Secondary | ICD-10-CM | POA: Diagnosis not present

## 2020-07-02 DIAGNOSIS — Y92129 Unspecified place in nursing home as the place of occurrence of the external cause: Secondary | ICD-10-CM | POA: Diagnosis not present

## 2020-07-02 DIAGNOSIS — I110 Hypertensive heart disease with heart failure: Secondary | ICD-10-CM | POA: Insufficient documentation

## 2020-07-02 DIAGNOSIS — Z7982 Long term (current) use of aspirin: Secondary | ICD-10-CM | POA: Diagnosis not present

## 2020-07-02 DIAGNOSIS — J45909 Unspecified asthma, uncomplicated: Secondary | ICD-10-CM | POA: Insufficient documentation

## 2020-07-02 DIAGNOSIS — S0993XA Unspecified injury of face, initial encounter: Secondary | ICD-10-CM | POA: Diagnosis present

## 2020-07-02 DIAGNOSIS — Z79899 Other long term (current) drug therapy: Secondary | ICD-10-CM | POA: Diagnosis not present

## 2020-07-02 DIAGNOSIS — W050XXA Fall from non-moving wheelchair, initial encounter: Secondary | ICD-10-CM | POA: Diagnosis not present

## 2020-07-03 ENCOUNTER — Emergency Department: Payer: Medicare Other

## 2020-07-03 ENCOUNTER — Encounter: Payer: Self-pay | Admitting: Emergency Medicine

## 2020-07-03 ENCOUNTER — Other Ambulatory Visit: Payer: Self-pay

## 2020-07-03 DIAGNOSIS — S0083XA Contusion of other part of head, initial encounter: Secondary | ICD-10-CM | POA: Diagnosis not present

## 2020-07-03 LAB — CBC
HCT: 42 % (ref 36.0–46.0)
Hemoglobin: 14.1 g/dL (ref 12.0–15.0)
MCH: 33 pg (ref 26.0–34.0)
MCHC: 33.6 g/dL (ref 30.0–36.0)
MCV: 98.4 fL (ref 80.0–100.0)
Platelets: 214 10*3/uL (ref 150–400)
RBC: 4.27 MIL/uL (ref 3.87–5.11)
RDW: 13 % (ref 11.5–15.5)
WBC: 11.7 10*3/uL — ABNORMAL HIGH (ref 4.0–10.5)
nRBC: 0 % (ref 0.0–0.2)

## 2020-07-03 LAB — URINALYSIS, COMPLETE (UACMP) WITH MICROSCOPIC
Bilirubin Urine: NEGATIVE
Glucose, UA: NEGATIVE mg/dL
Ketones, ur: NEGATIVE mg/dL
Nitrite: POSITIVE — AB
Protein, ur: 30 mg/dL — AB
Specific Gravity, Urine: 1.005 (ref 1.005–1.030)
pH: 7 (ref 5.0–8.0)

## 2020-07-03 LAB — COMPREHENSIVE METABOLIC PANEL
ALT: 22 U/L (ref 0–44)
AST: 27 U/L (ref 15–41)
Albumin: 4.3 g/dL (ref 3.5–5.0)
Alkaline Phosphatase: 103 U/L (ref 38–126)
Anion gap: 7 (ref 5–15)
BUN: 15 mg/dL (ref 8–23)
CO2: 28 mmol/L (ref 22–32)
Calcium: 9.8 mg/dL (ref 8.9–10.3)
Chloride: 105 mmol/L (ref 98–111)
Creatinine, Ser: 0.62 mg/dL (ref 0.44–1.00)
GFR, Estimated: >60 mL/min (ref >60)
Glucose, Bld: 170 mg/dL — ABNORMAL HIGH (ref 70–99)
Potassium: 4 mmol/L (ref 3.5–5.1)
Sodium: 140 mmol/L (ref 135–145)
Total Bilirubin: 0.6 mg/dL (ref 0.3–1.2)
Total Protein: 7.9 g/dL (ref 6.5–8.1)

## 2020-07-03 LAB — TROPONIN I (HIGH SENSITIVITY): Troponin I (High Sensitivity): 10 ng/L (ref <18)

## 2020-07-03 MED ORDER — LABETALOL HCL 5 MG/ML IV SOLN
10.0000 mg | Freq: Once | INTRAVENOUS | Status: AC
  Administered 2020-07-03: 10 mg via INTRAVENOUS
  Filled 2020-07-03: qty 4

## 2020-07-03 MED ORDER — CEPHALEXIN 500 MG PO CAPS: 500.0000 mg | ORAL_CAPSULE | Freq: Two times a day (BID) | ORAL | 0 refills | Status: AC

## 2020-07-03 MED ORDER — CEPHALEXIN 500 MG PO CAPS
500.0000 mg | ORAL_CAPSULE | Freq: Once | ORAL | Status: AC
  Administered 2020-07-03: 500 mg via ORAL
  Filled 2020-07-03: qty 1

## 2020-07-03 NOTE — ED Provider Notes (Signed)
[facility] Emergency Department Provider Note  ____________________________________________   First MD Initiated Contact with Patient 07/03/20 0000     (approximate)  I have reviewed the triage vital signs and the nursing notes.   HISTORY  Chief Complaint Fall    HPI [name] is a 74 y.o. female with below list of previous medical conditions presents to the emergency department via EMS from [facility].  EMS notified that the patient had an unwitnessed fall with resultant forehead injury.  Patient denies any loss of consciousness.  Patient states that she lost her balance while leaning forward in her wheelchair.  Patient admits to a headache current pain score of 9 out of 10.  In addition patient admits to pelvic discomfort.       Past Medical History:  Diagnosis Date  . Asthma   . CHF (congestive heart failure) (HCC)   . Diabetes mellitus without complication (HCC)   . Hepatitis A   . Hepatitis B   . Hepatitis C   . Hypertension   . Respiratory failure (HCC)   . Schizophrenia (HCC)   . Seizures (HCC)   . Smoker    1 pack daily    Patient Active Problem List   Diagnosis Date Noted  . Acute delirium 09/26/2019  . Abnormal urinalysis 09/26/2019  . Person under investigation for COVID-19 09/26/2019  . Asthma   . Chronic diastolic heart failure (HCC)   . Schizophrenia (HCC)   . Hyperlipidemia 11/03/2016  . Left-sided weakness 11/03/2016  . Dementia (HCC) 11/03/2016  . 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 TIA (transient ischemic attack) 11/02/2016  . Bronchiectasis without acute exacerbation (HCC) 10/17/2013  . Seizures (HCC) 09/19/2013  . Encounter for long-term (current) use of other medications 09/19/2013  . Hypercalcemia 09/19/2013  . Atrial fibrillation (HCC) 08/14/2013  . Congestive heart failure, unspecified 08/14/2013  . Hypopotassemia 08/14/2013  . Seizure disorder (HCC) 07/14/2013  . Schizophrenia,  chronic condition (HCC) 07/14/2013  . Hypertension 07/14/2013  . Sepsis (HCC) 07/14/2013  . UTI (urinary tract infection) 07/14/2013  . Altered mental status 04/01/2013  . Anemia 03/31/2013  . Status epilepticus (HCC) 03/28/2013  . Acute respiratory failure (HCC) 03/28/2013  . Diabetes mellitus without complication (HCC) 03/28/2013    History reviewed. No pertinent surgical history.  Prior to Admission medications   Medication Sig Start Date End Date Taking? Authorizing Provider  acetaminophen (TYLENOL) 500 MG tablet Take 500 mg by mouth every 6 (six) hours as needed for pain.    [provider]  aspirin EC 81 MG tablet Take 81 mg by mouth daily.    [provider]  atorvastatin (LIPITOR) 40 MG tablet Take 1 tablet (40 mg total) by mouth daily at 6 PM. Patient taking differently: Take 40 mg by mouth every evening.  11/03/16   Katharina Caper, MD  B Complex-C (B-COMPLEX WITH VITAMIN C) tablet Take 1 tablet by mouth daily.    [provider]  benztropine (COGENTIN) 0.5 MG tablet Take 0.5 mg by mouth 2 (two) times daily.    [provider]  calcium carbonate (TUMS - DOSED IN MG ELEMENTAL CALCIUM) 500 MG chewable tablet Chew 1 tablet by mouth 2 (two) times daily.    [provider]  Chloroxylenol-Zinc Oxide (BAZA EX) Apply topically See admin instructions. Apply barrier cream topically to right buttock lesion after each clean    [provider]  cyclobenzaprine (FLEXERIL) 5 MG tablet Take 5 mg by mouth  2 (two) times daily as needed for muscle spasms.    [provider]  famotidine (PEPCID) 20 MG tablet Take 20 mg by mouth at bedtime.    [provider]  fluPHENAZine (PROLIXIN) 5 MG tablet Take 5 mg by mouth 2 (two) times daily. At 1400 and 2000    [provider]  fluPHENAZine decanoate (PROLIXIN) 25 MG/ML injection Inject into the muscle every 14 (fourteen) days.    [provider]  lacosamide (VIMPAT) 200  MG TABS tablet Take one tablet by mouth twice daily 08/15/13   Sharon Seller, NP  lamoTRIgine (LAMICTAL) 200 MG tablet Take 200 mg by mouth 2 (two) times daily.     [provider]  levETIRAcetam (KEPPRA) 1000 MG tablet Take 1 tablet (1,000 mg total) by mouth 2 (two) times daily. 07/14/13   Street, Stephanie Coup, MD  metoprolol (LOPRESSOR) 50 MG tablet Take 50 mg by mouth 2 (two) times daily. Do not administer if pulse <50    [provider]  oxybutynin (DITROPAN) 5 MG tablet Take 5 mg by mouth 2 (two) times daily.    [provider]  phenytoin (DILANTIN) 100 MG ER capsule Take 100 mg by mouth 2 (two) times daily.     [provider]  polyethylene glycol (MIRALAX / GLYCOLAX) 17 g packet Take 17 g by mouth daily.    [provider]  thiamine (VITAMIN B-1) 100 MG tablet Take 100 mg by mouth daily.    [provider]    Allergies Depakote [valproic acid], Haldol [haloperidol lactate], Penicillins, Shellfish allergy, and Trileptal [oxcarbazepine]  History reviewed. No pertinent family history.  Social History Social History   Tobacco Use  . Smoking status: Current Every Day Smoker    Packs/day: 1.00    Years: 50.00    Pack years: 50.00    Types: Cigarettes  . Smokeless tobacco: Never Used  Substance Use Topics  . Alcohol use: Not on file  . Drug use: Not on file    Review of Systems Constitutional: No fever/chills Eyes: No visual changes. ENT: No sore throat. Cardiovascular: Denies chest pain. Respiratory: Denies shortness of breath. Gastrointestinal: No abdominal pain.  No nausea, no vomiting.  No diarrhea.  No constipation. Genitourinary: Negative for dysuria. Musculoskeletal: Negative for neck pain.  Negative for back pain. Integumentary: Negative for rash. Neurological: Negative for headaches, focal weakness or numbness.   ____________________________________________   PHYSICAL EXAM:  VITAL SIGNS: ED Triage  Vitals  Enc Vitals Group     BP 07/03/20 0005 (!) 219/100     Pulse Rate 07/03/20 0005 98     Resp 07/03/20 0005 13     Temp 07/03/20 0005 98 F (36.7 C)     Temp Source 07/03/20 0005 Oral     SpO2 07/03/20 0005 98 %     Weight 07/03/20 0008 71.4 kg (157 lb 6.5 oz)     Height 07/03/20 0008 1.651 m (5\' 5" )     Head Circumference --      Peak Flow --      Pain Score 07/03/20 0007 9     Pain Loc --      Pain Edu? --      Excl. in GC? --     Constitutional: Alert and oriented.  Eyes: Conjunctivae are normal.  Head: Golf ball size area of swelling mid forehead Ears:  Healthy appearing ear canals and TMs bilaterally Nose: No congestion/rhinnorhea. Mouth/Throat: Patient is wearing a mask. Neck:  No stridor.  No meningeal signs.   Cardiovascular: Normal rate, regular rhythm. Good peripheral circulation. Grossly normal heart sounds. Respiratory: Normal respiratory effort.  No retractions. Gastrointestinal: Soft and nontender. No distention.   Musculoskeletal: No lower extremity tenderness nor edema. No gross deformities of extremities. Neurologic:  Normal speech and language. No gross focal neurologic deficits are appreciated.  Skin:  Skin is warm, dry and intact. Psychiatric: Mood and affect are normal. Speech and behavior are normal.  ____________________________________________   LABS (all labs ordered are listed, but only abnormal results are displayed)  Labs Reviewed  CBC  COMPREHENSIVE METABOLIC PANEL  URINALYSIS, COMPLETE (UACMP) WITH MICROSCOPIC  TROPONIN I (HIGH SENSITIVITY)   ____________________________________________  EKG  ED ECG REPORT I, A[place] N Daysha Ashmore, the attending physician, personally viewed and interpreted this ECG.   Date: 07/03/2020  EKG Time: 9:05 AM  Rate: 95  Rhythm: Normal sinus rhythm with a left bundle branch block  Axis:   Intervals: Normal  ST&T Change: None   RADIOLOGY I, [place] N Nathin Saran, personally viewed and evaluated these  images (plain radiographs) as part of my medical decision making, as well as reviewing the written report by the radiologist.  ED MD interpretation: Advanced degenerative changes in the hips however no acute findings per radiologist.    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 Progressive protrusio acetabuli, left greater than right. No visible fracture. 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 No visible fracture. Electronically Signed   By: Charlett NoseKevin  Dover M.D.   On: 07/03/2020 00:50   CT Head Wo Contrast  Result Date: 07/03/2020 CLINICAL DATA:  Unwitnessed fall EXAM: CT HEAD WITHOUT CONTRAST TECHNIQUE: Contiguous axial images were obtained from the base of the skull through the vertex without intravenous contrast. COMPARISON:  09/26/2019 FINDINGS: Brain: There is atrophy and chronic small vessel disease changes. No acute intracranial abnormality. Specifically, no hemorrhage, hydrocephalus, mass lesion, acute infarction, or significant intracranial injury. Old right basal ganglia lacunar infarct. Vascular: No hyperdense vessel or unexpected calcification. Skull: No acute calvarial abnormality. Sinuses/Orbits: No acute finding Other: Soft tissue swelling in the forehead. IMPRESSION: Old right basal ganglia lacunar infarct. Atrophy, chronic microvascular disease. No acute intracranial abnormality. Soft tissue swelling/hematoma in the forehead. Electronically Signed   By: Charlett NoseKevin  Dover M.D.   On: 07/03/2020 00:48   CT Cervical Spine Wo Contrast  Result Date: 07/03/2020 CLINICAL DATA:  Unwitnessed fall EXAM: CT CERVICAL SPINE WITHOUT CONTRAST TECHNIQUE: Multidetector CT imaging of the cervical spine was performed without  intravenous contrast. Multiplanar CT image reconstructions were also generated. COMPARISON:  11/20/2013 FINDINGS: Alignment: Normal Skull base and vertebrae: No acute fracture. No primary bone lesion or focal pathologic process. Soft tissues and spinal canal: No prevertebral fluid or swelling. No visible canal hematoma. Disc levels: Early disc space narrowing and spurring. Mild bilateral degenerative facet disease. Upper chest: Negative Other: None IMPRESSION: No acute bony abnormality. Electronically Signed   By: Charlett NoseKevin  Dover M.D.   On: 07/03/2020 00:49      Procedures   ____________________________________________   INITIAL IMPRESSION / MDM / ASSESSMENT AND PLAN / ED COURSE  As part of my medical decision making, I reviewed the following data within the electronic MEDICAL RECORD NUMBER   74 year old female presented with above-stated history and physical  exam following an unwitnessed fall with forehead injury.  CT scan of the head revealed forehead soft tissue swelling/hematoma, ice pack was applied immediately on arrival.  CT head and cervical spine revealed no acute findings likewise pelvis x-ray.  Urinalysis consistent with urinary tract infection for which patient was given Keflex p.o. and will be prescribed the same upon discharge.  ____________________________________________  FINAL CLINICAL IMPRESSION(S) / ED DIAGNOSES  Final diagnoses:  Contusion of face, initial encounter  Acute cystitis without hematuria     MEDICATIONS GIVEN DURING THIS VISIT:  Medications - No data to display   ED Discharge Orders    None      *Please note:  [name] was evaluated in Emergency Department on 07/03/2020 for the symptoms described in the history of present illness. She was evaluated in the context of the global COVID-19 pandemic, which necessitated consideration that the patient might be at risk for infection with the SARS-CoV-2 virus that causes COVID-19. Institutional protocols and  algorithms that pertain to the evaluation of patients at risk for COVID-19 are in a state of rapid change based on information released by regulatory bodies including the CDC and federal and state organizations. These policies and algorithms were followed during the patient's care in the ED.  Some ED evaluations and interventions may be delayed as a result of limited staffing during and after the pandemic.*  Note:  This document was prepared using Dragon voice recognition software and may include unintentional dictation errors.   Darci Current, MD 07/03/20 [phone]

## 2020-07-03 NOTE — ED Notes (Signed)
Pt has hematoma on forehead and right shoulder pain from fall from wheelchair to floor. Pt denies LOC. Pain 9 out of 10.

## 2020-07-03 NOTE — ED Notes (Addendum)
Report given to Ardyth Gal about patient going back to [facility].

## 2020-07-03 NOTE — ED Triage Notes (Signed)
Pt arrival via [facility] due to unwitnessed fall at [facility]. Pt states that she was in her wheelchair leaning forward when she fell on her head. Pt has hematoma on forehead. Pt denies losing consciousness. Pt is not on blood thinners and is a&ox4.   VS with EMS: -89 HR 98% O2 229/110 BP

## 2020-07-03 NOTE — ED Notes (Signed)
Pt transported to CT and Xray. Will obtain labs when she returns.

## 2020-07-03 NOTE — ED Notes (Signed)
Pt changed back into her clothing at this time. Pt waiting for transport, comfortable in bed and denies needs.

## 2020-07-03 NOTE — ED Notes (Signed)
Pt brief changed at this time, pt comfortable in bed, in hospital gown with new chux underneath. No further needs noted. Will continue to monitor.

## 2020-09-23 ENCOUNTER — Emergency Department
Admission: EM | Admit: 2020-09-23 | Discharge: 2020-09-23 | Disposition: A | Discharge status: Home / Self Care | Payer: Medicare Other | Attending: Emergency Medicine | Admitting: Emergency Medicine

## 2020-09-23 ENCOUNTER — Emergency Department: Payer: Medicare Other

## 2020-09-23 ENCOUNTER — Other Ambulatory Visit: Payer: Self-pay

## 2020-09-23 DIAGNOSIS — Z79899 Other long term (current) drug therapy: Secondary | ICD-10-CM | POA: Insufficient documentation

## 2020-09-23 DIAGNOSIS — E119 Type 2 diabetes mellitus without complications: Secondary | ICD-10-CM | POA: Insufficient documentation

## 2020-09-23 DIAGNOSIS — F17210 Nicotine dependence, cigarettes, uncomplicated: Secondary | ICD-10-CM | POA: Insufficient documentation

## 2020-09-23 DIAGNOSIS — I5032 Chronic diastolic (congestive) heart failure: Secondary | ICD-10-CM | POA: Diagnosis not present

## 2020-09-23 DIAGNOSIS — F0390 Unspecified dementia without behavioral disturbance: Secondary | ICD-10-CM | POA: Diagnosis not present

## 2020-09-23 DIAGNOSIS — R079 Chest pain, unspecified: Secondary | ICD-10-CM | POA: Diagnosis present

## 2020-09-23 DIAGNOSIS — J45909 Unspecified asthma, uncomplicated: Secondary | ICD-10-CM | POA: Diagnosis not present

## 2020-09-23 DIAGNOSIS — I110 Hypertensive heart disease with heart failure: Secondary | ICD-10-CM | POA: Diagnosis not present

## 2020-09-23 DIAGNOSIS — Z7982 Long term (current) use of aspirin: Secondary | ICD-10-CM | POA: Insufficient documentation

## 2020-09-23 DIAGNOSIS — R0789 Other chest pain: Secondary | ICD-10-CM

## 2020-09-23 LAB — BASIC METABOLIC PANEL
Anion gap: 10 (ref 5–15)
BUN: 14 mg/dL (ref 8–23)
CO2: 30 mmol/L (ref 22–32)
Calcium: 10 mg/dL (ref 8.9–10.3)
Chloride: 100 mmol/L (ref 98–111)
Creatinine, Ser: 0.76 mg/dL (ref 0.44–1.00)
GFR, Estimated: >60 mL/min (ref >60)
Glucose, Bld: 204 mg/dL — ABNORMAL HIGH (ref 70–99)
Potassium: 3.8 mmol/L (ref 3.5–5.1)
Sodium: 140 mmol/L (ref 135–145)

## 2020-09-23 LAB — CBC
HCT: 42 % (ref 36.0–46.0)
Hemoglobin: 13.8 g/dL (ref 12.0–15.0)
MCH: 32.9 pg (ref 26.0–34.0)
MCHC: 32.9 g/dL (ref 30.0–36.0)
MCV: 100 fL (ref 80.0–100.0)
Platelets: 190 10*3/uL (ref 150–400)
RBC: 4.2 MIL/uL (ref 3.87–5.11)
RDW: 12.9 % (ref 11.5–15.5)
WBC: 6.3 10*3/uL (ref 4.0–10.5)
nRBC: 0 % (ref 0.0–0.2)

## 2020-09-23 LAB — TROPONIN I (HIGH SENSITIVITY)
Troponin I (High Sensitivity): 8 ng/L (ref <18)
Troponin I (High Sensitivity): 8 ng/L (ref <18)

## 2020-09-23 NOTE — ED Provider Notes (Signed)
[facility] Emergency Department Provider Note   ____________________________________________   Event Date/Time   First MD Initiated Contact with Patient 09/23/20 1808     (approximate)  I have reviewed the triage vital signs and the nursing notes.   HISTORY  Chief Complaint Chest Pain    HPI [name] is a 74 y.o. female with a history of CHF, diabetes, schizophrenia, and dementia who presents via EMS from her long-term care facility complaining of chest pain.  Patient states "my heart is hurting.  It is because I am upset."  EMS report that they were called out for possible stroke but did not find any acute neurologic symptoms in this patient.  Patient denies any active chest pain at this time.  Further history and review of systems unable to assess secondary to patient's mental status.         Past Medical History:  Diagnosis Date  . Asthma   . CHF (congestive heart failure) (HCC)   . Diabetes mellitus without complication (HCC)   . Hepatitis A   . Hepatitis B   . Hepatitis C   . Hypertension   . Respiratory failure (HCC)   . Schizophrenia (HCC)   . Seizures (HCC)   . Smoker    1 pack daily    Patient Active Problem List   Diagnosis Date Noted  . Acute delirium 09/26/2019  . Abnormal urinalysis 09/26/2019  . Person under investigation for COVID-19 09/26/2019  . Asthma   . Chronic diastolic heart failure (HCC)   . Schizophrenia (HCC)   . Hyperlipidemia 11/03/2016  . Left-sided weakness 11/03/2016  . Dementia (HCC) 11/03/2016  . 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 TIA (transient ischemic attack) 11/02/2016  . Bronchiectasis without acute exacerbation (HCC) 10/17/2013  . Seizures (HCC) 09/19/2013  . Encounter for long-term (current) use of other medications 09/19/2013  . Hypercalcemia 09/19/2013  . Atrial fibrillation (HCC) 08/14/2013  . Congestive heart failure, unspecified 08/14/2013  . Hypopotassemia 08/14/2013  . Seizure  disorder (HCC) 07/14/2013  . Schizophrenia, chronic condition (HCC) 07/14/2013  . Hypertension 07/14/2013  . Sepsis (HCC) 07/14/2013  . UTI (urinary tract infection) 07/14/2013  . Altered mental status 04/01/2013  . Anemia 03/31/2013  . Status epilepticus (HCC) 03/28/2013  . Acute respiratory failure (HCC) 03/28/2013  . Diabetes mellitus without complication (HCC) 03/28/2013    History reviewed. No pertinent surgical history.  Prior to Admission medications   Medication Sig Start Date End Date Taking? Authorizing Provider  acetaminophen (TYLENOL) 500 MG tablet Take 500 mg by mouth every 6 (six) hours as needed for pain.    [provider]  aspirin EC 81 MG tablet Take 81 mg by mouth daily.    [provider]  atorvastatin (LIPITOR) 40 MG tablet Take 1 tablet (40 mg total) by mouth daily at 6 PM. Patient taking differently: Take 40 mg by mouth every evening.  11/03/16   Katharina Caper, MD  B Complex-C (B-COMPLEX WITH VITAMIN C) tablet Take 1 tablet by mouth daily.    [provider]  benztropine (COGENTIN) 0.5 MG tablet Take 0.5 mg by mouth 2 (two) times daily.    [provider]  calcium carbonate (TUMS - DOSED IN MG ELEMENTAL CALCIUM) 500 MG chewable tablet Chew 1 tablet by mouth 2 (two) times daily.    [provider]  Chloroxylenol-Zinc Oxide (BAZA EX) Apply topically See admin instructions. Apply barrier cream topically to right buttock lesion after each clean  [provider]  cyclobenzaprine (FLEXERIL) 5 MG tablet Take 5 mg by mouth 2 (two) times daily as needed for muscle spasms.    [provider]  famotidine (PEPCID) 20 MG tablet Take 20 mg by mouth at bedtime.    [provider]  fluPHENAZine (PROLIXIN) 5 MG tablet Take 5 mg by mouth 2 (two) times daily. At 1400 and 2000    [provider]  fluPHENAZine decanoate (PROLIXIN) 25 MG/ML injection Inject into the muscle every 14 (fourteen) days.     [provider]  lacosamide (VIMPAT) 200 MG TABS tablet Take one tablet by mouth twice daily 08/15/13   Sharon Seller, NP  lamoTRIgine (LAMICTAL) 200 MG tablet Take 200 mg by mouth 2 (two) times daily.     [provider]  levETIRAcetam (KEPPRA) 1000 MG tablet Take 1 tablet (1,000 mg total) by mouth 2 (two) times daily. 07/14/13   Street, Stephanie Coup, MD  metoprolol (LOPRESSOR) 50 MG tablet Take 50 mg by mouth 2 (two) times daily. Do not administer if pulse <50    [provider]  oxybutynin (DITROPAN) 5 MG tablet Take 5 mg by mouth 2 (two) times daily.    [provider]  phenytoin (DILANTIN) 100 MG ER capsule Take 100 mg by mouth 2 (two) times daily.     [provider]  polyethylene glycol (MIRALAX / GLYCOLAX) 17 g packet Take 17 g by mouth daily.    [provider]  thiamine (VITAMIN B-1) 100 MG tablet Take 100 mg by mouth daily.    [provider]    Allergies Depakote [valproic acid], Haldol [haloperidol lactate], Penicillins, Shellfish allergy, and Trileptal [oxcarbazepine]  No family history on file.  Social History Social History   Tobacco Use  . Smoking status: Current Every Day Smoker    Packs/day: 1.00    Years: 50.00    Pack years: 50.00    Types: Cigarettes  . Smokeless tobacco: Never Used    Review of Systems Unable to assess ____________________________________________   PHYSICAL EXAM:  VITAL SIGNS: ED Triage Vitals [09/23/20 1512]  Enc Vitals Group     BP (!) 154/85     Pulse Rate (!) 55     Resp 18     Temp 97.7 F (36.5 C)     Temp src      SpO2 100 %     Weight 180 lb (81.6 kg)     Height 5\' 5"  (1.651 m)     Head Circumference      Peak Flow      Pain Score 5     Pain Loc      Pain Edu?      Excl. in GC?    Constitutional: Alert and oriented to person only. Well appearing and in no acute distress. Eyes: Conjunctivae are normal. PERRL. Head: Atraumatic. Nose: No  congestion/rhinnorhea. Mouth/Throat: Mucous membranes are moist. Neck: No stridor Cardiovascular: Grossly normal heart sounds.  Good peripheral circulation. Respiratory: Normal respiratory effort.  No retractions. Gastrointestinal: Soft and nontender. No distention. Musculoskeletal: No obvious deformities Neurologic:  Normal speech and language. No gross focal neurologic deficits are appreciated. Skin:  Skin is warm and dry. No rash noted. Psychiatric: Mood and affect are normal. Speech and behavior are normal.  ____________________________________________   LABS (all labs ordered are listed, but only abnormal results are displayed)  Labs Reviewed  BASIC METABOLIC PANEL - Abnormal; Notable for the following components:  Result Value   Glucose, Bld 204 (*)    All other components within normal limits  CBC  TROPONIN I (HIGH SENSITIVITY)  TROPONIN I (HIGH SENSITIVITY)   ____________________________________________  EKG  ED ECG REPORT I, Merwyn Katos, the attending physician, personally viewed and interpreted this ECG.  Date: 09/23/2020 EKG Time: 1505 Rate: 51 Rhythm: normal sinus rhythm QRS Axis: normal Intervals: normal ST/T Wave abnormalities: normal Narrative Interpretation: no evidence of acute ischemia  ____________________________________________  RADIOLOGY  ED MD interpretation: 2 view chest x-ray shows no evidence of acute abnormalities including no pneumonia, pneumothorax, or widened mediastinum  Official radiology report(s): DG Chest 2 View  Result Date: 09/23/2020 CLINICAL DATA:  Right-sided chest pain EXAM: CHEST - 2 VIEW COMPARISON:  09/26/2019 FINDINGS: No focal consolidation or effusion. Minimal atelectasis or scarring at the bases. Stable cardiomediastinal silhouette with aortic atherosclerosis. No pneumothorax IMPRESSION: Minimal atelectasis or scarring at the bases. Electronically Signed   By: Jasmine Pang M.D.   On: 09/23/2020 16:00     ____________________________________________   PROCEDURES  Procedure(s) performed (including Critical Care):  .1-3 Lead EKG Interpretation Performed by: Merwyn Katos, MD Authorized by: Merwyn Katos, MD     Interpretation: normal     ECG rate:  54   ECG rate assessment: normal     Rhythm: sinus rhythm     Ectopy: none     Conduction: normal       ____________________________________________   INITIAL IMPRESSION / ASSESSMENT AND PLAN / ED COURSE  As part of my medical decision making, I reviewed the following data within the electronic MEDICAL RECORD NUMBER Nursing notes reviewed and incorporated, Labs reviewed, EKG interpreted, Old chart reviewed, Radiograph reviewed and Notes from prior ED visits reviewed and incorporated        Workup: ECG, CXR, CBC, BMP, Troponin Findings: ECG: No overt evidence of STEMI. No evidence of Brugadas sign, delta wave, epsilon wave, significantly prolonged QTc, or malignant arrhythmia HS Troponin: Negative x1 Other Labs unremarkable for emergent problems. CXR: Without PTX, PNA, or widened mediastinum Last Stress Test: Unknown Last Heart Catheterization: Unknown HEART Score: 1  Given History, Exam, and Workup I have low suspicion for ACS, Pneumothorax, Pneumonia, Pulmonary Embolus, Tamponade, Aortic Dissection or other emergent problem as a cause for this presentation.   Reassesment: Prior to discharge patients pain was controlled and they were well appearing.  Disposition:  Discharge. Strict return precautions discussed with patient with full understanding. Advised patient to follow up promptly with primary care provider      ____________________________________________   FINAL CLINICAL IMPRESSION(S) / ED DIAGNOSES  Final diagnoses:  Atypical chest pain     ED Discharge Orders    None       Note:  This document was prepared using Dragon voice recognition software and may include unintentional dictation errors.    Merwyn Katos, MD 09/23/20 [phone]

## 2020-09-23 NOTE — ED Triage Notes (Addendum)
Pt comes via EMS from [facility] with c/o CP. Pt states her heart is hurting. Pt states when she gets upset her heart hurts. EMS reports they were called out for possible stroke. Stroke neuro negative. Pt was hypertensive.  Pt states no N/V/D  Pt states right sided CP with no radiation.  Attempted to call legal guardian Romona Curls with no answer. Left voicemail at this time

## 2020-09-26 ENCOUNTER — Other Ambulatory Visit: Payer: Self-pay

## 2020-09-26 ENCOUNTER — Emergency Department: Payer: Medicare Other

## 2020-09-26 ENCOUNTER — Emergency Department
Admission: EM | Admit: 2020-09-26 | Discharge: 2020-09-26 | Disposition: A | Discharge status: Home / Self Care | Payer: Medicare Other | Attending: Emergency Medicine | Admitting: Emergency Medicine

## 2020-09-26 DIAGNOSIS — R569 Unspecified convulsions: Secondary | ICD-10-CM | POA: Diagnosis not present

## 2020-09-26 DIAGNOSIS — F17210 Nicotine dependence, cigarettes, uncomplicated: Secondary | ICD-10-CM | POA: Insufficient documentation

## 2020-09-26 DIAGNOSIS — Z7982 Long term (current) use of aspirin: Secondary | ICD-10-CM | POA: Diagnosis not present

## 2020-09-26 DIAGNOSIS — J188 Other pneumonia, unspecified organism: Secondary | ICD-10-CM | POA: Insufficient documentation

## 2020-09-26 DIAGNOSIS — R410 Disorientation, unspecified: Secondary | ICD-10-CM | POA: Diagnosis not present

## 2020-09-26 DIAGNOSIS — I5032 Chronic diastolic (congestive) heart failure: Secondary | ICD-10-CM | POA: Diagnosis not present

## 2020-09-26 DIAGNOSIS — F0390 Unspecified dementia without behavioral disturbance: Secondary | ICD-10-CM | POA: Insufficient documentation

## 2020-09-26 DIAGNOSIS — J189 Pneumonia, unspecified organism: Secondary | ICD-10-CM

## 2020-09-26 DIAGNOSIS — Z20822 Contact with and (suspected) exposure to covid-19: Secondary | ICD-10-CM | POA: Insufficient documentation

## 2020-09-26 DIAGNOSIS — I110 Hypertensive heart disease with heart failure: Secondary | ICD-10-CM | POA: Insufficient documentation

## 2020-09-26 DIAGNOSIS — E119 Type 2 diabetes mellitus without complications: Secondary | ICD-10-CM | POA: Diagnosis not present

## 2020-09-26 DIAGNOSIS — Z79899 Other long term (current) drug therapy: Secondary | ICD-10-CM | POA: Insufficient documentation

## 2020-09-26 DIAGNOSIS — G40919 Epilepsy, unspecified, intractable, without status epilepticus: Secondary | ICD-10-CM

## 2020-09-26 DIAGNOSIS — J45909 Unspecified asthma, uncomplicated: Secondary | ICD-10-CM | POA: Diagnosis not present

## 2020-09-26 LAB — URINALYSIS, COMPLETE (UACMP) WITH MICROSCOPIC
Bilirubin Urine: NEGATIVE
Glucose, UA: NEGATIVE mg/dL
Hgb urine dipstick: NEGATIVE
Ketones, ur: NEGATIVE mg/dL
Nitrite: POSITIVE — AB
Protein, ur: 30 mg/dL — AB
Specific Gravity, Urine: 1.008 (ref 1.005–1.030)
pH: 7 (ref 5.0–8.0)

## 2020-09-26 LAB — CBC
HCT: 42.7 % (ref 36.0–46.0)
Hemoglobin: 14.1 g/dL (ref 12.0–15.0)
MCH: 33.4 pg (ref 26.0–34.0)
MCHC: 33 g/dL (ref 30.0–36.0)
MCV: 101.2 fL — ABNORMAL HIGH (ref 80.0–100.0)
Platelets: 156 10*3/uL (ref 150–400)
RBC: 4.22 MIL/uL (ref 3.87–5.11)
RDW: 13.2 % (ref 11.5–15.5)
WBC: 7.2 10*3/uL (ref 4.0–10.5)
nRBC: 0 % (ref 0.0–0.2)

## 2020-09-26 LAB — PHENYTOIN LEVEL, TOTAL: Phenytoin Lvl: 4.3 ug/mL — ABNORMAL LOW (ref 10.0–20.0)

## 2020-09-26 LAB — COMPREHENSIVE METABOLIC PANEL
ALT: 21 U/L (ref 0–44)
AST: 27 U/L (ref 15–41)
Albumin: 3.9 g/dL (ref 3.5–5.0)
Alkaline Phosphatase: 82 U/L (ref 38–126)
Anion gap: 10 (ref 5–15)
BUN: 18 mg/dL (ref 8–23)
CO2: 27 mmol/L (ref 22–32)
Calcium: 9.7 mg/dL (ref 8.9–10.3)
Chloride: 105 mmol/L (ref 98–111)
Creatinine, Ser: 0.72 mg/dL (ref 0.44–1.00)
GFR, Estimated: >60 mL/min (ref >60)
Glucose, Bld: 144 mg/dL — ABNORMAL HIGH (ref 70–99)
Potassium: 3.9 mmol/L (ref 3.5–5.1)
Sodium: 142 mmol/L (ref 135–145)
Total Bilirubin: 0.5 mg/dL (ref 0.3–1.2)
Total Protein: 6.7 g/dL (ref 6.5–8.1)

## 2020-09-26 LAB — TROPONIN I (HIGH SENSITIVITY)
Troponin I (High Sensitivity): 10 ng/L (ref <18)
Troponin I (High Sensitivity): 10 ng/L (ref <18)

## 2020-09-26 LAB — CBG MONITORING, ED: Glucose-Capillary: 120 mg/dL — ABNORMAL HIGH (ref 70–99)

## 2020-09-26 MED ORDER — SODIUM CHLORIDE 0.9 % IV SOLN
1000.0000 mg | Freq: Once | INTRAVENOUS | Status: DC
  Filled 2020-09-26: qty 20

## 2020-09-26 MED ORDER — CEFPODOXIME PROXETIL 200 MG PO TABS: 200.0000 mg | ORAL_TABLET | Freq: Two times a day (BID) | ORAL | 0 refills | Status: AC

## 2020-09-26 MED ORDER — LEVETIRACETAM 500 MG PO TABS
1000.0000 mg | ORAL_TABLET | Freq: Once | ORAL | Status: AC
  Administered 2020-09-26: 1000 mg via ORAL
  Filled 2020-09-26: qty 2

## 2020-09-26 MED ORDER — CEFDINIR 300 MG PO CAPS
300.0000 mg | ORAL_CAPSULE | Freq: Once | ORAL | Status: AC
  Administered 2020-09-26: 300 mg via ORAL
  Filled 2020-09-26: qty 1

## 2020-09-26 MED ORDER — SODIUM CHLORIDE 0.9 % IV SOLN
500.0000 mg | Freq: Once | INTRAVENOUS | Status: DC
  Filled 2020-09-26: qty 500

## 2020-09-26 MED ORDER — LACOSAMIDE 50 MG PO TABS
200.0000 mg | ORAL_TABLET | Freq: Once | ORAL | Status: AC
  Administered 2020-09-26: 200 mg via ORAL
  Filled 2020-09-26: qty 4

## 2020-09-26 MED ORDER — AZITHROMYCIN 500 MG PO TABS
500.0000 mg | ORAL_TABLET | Freq: Once | ORAL | Status: AC
  Administered 2020-09-26: 500 mg via ORAL
  Filled 2020-09-26: qty 1

## 2020-09-26 MED ORDER — SODIUM CHLORIDE 0.9 % IV SOLN
2.0000 g | Freq: Once | INTRAVENOUS | Status: DC
  Filled 2020-09-26: qty 20

## 2020-09-26 MED ORDER — AZITHROMYCIN 250 MG PO TABS: 250.0000 mg | ORAL_TABLET | Freq: Every day | ORAL | 0 refills | Status: AC

## 2020-09-26 NOTE — ED Notes (Signed)
Pt assisted to/from toilet. Heavy assist required. Pt cleansed, clean brief and disposable pants provided. Pt back to bed with fresh blankets  Awaiting EMS trx

## 2020-09-26 NOTE — ED Notes (Signed)
This RN to bedside at this time. Pt states that she is here for a "check-up." Pt is A&Ox4 and NAD. VSS.

## 2020-09-26 NOTE — ED Notes (Signed)
Attempted to reach pt's DSS worker, Saintclair Halsted, left a HIPAA compliant message at this time.

## 2020-09-26 NOTE — ED Provider Notes (Signed)
[facility] Emergency Department Provider Note  ____________________________________________   Event Date/Time   First MD Initiated Contact with Patient 09/26/20 1251     (approximate)  I have reviewed the triage vital signs and the nursing notes.   HISTORY  Chief Complaint Altered Mental Status    HPI [name] is a 75 y.o. female  With h/o schizophrenia, seizures, hepatitis, here with possible breakthrough seizure. Per report, pt began staring off at breakfast today, with her tongue stuck out. She did not respond when called. She then came back to consciousness and is back to her baseline. She had been well prior to this per report. On my assessment, pt confused - denies any complaints. States she is here for "a checkup."   Level 5 caveat invoked as remainder of history, ROS, and physical exam limited due to patient's dementia.         Past Medical History:  Diagnosis Date  . Asthma   . CHF (congestive heart failure) (HCC)   . Diabetes mellitus without complication (HCC)   . Hepatitis A   . Hepatitis B   . Hepatitis C   . Hypertension   . Respiratory failure (HCC)   . Schizophrenia (HCC)   . Seizures (HCC)   . Smoker    1 pack daily    Patient Active Problem List   Diagnosis Date Noted  . Acute delirium 09/26/2019  . Abnormal urinalysis 09/26/2019  . Person under investigation for COVID-19 09/26/2019  . Asthma   . Chronic diastolic heart failure (HCC)   . Schizophrenia (HCC)   . Hyperlipidemia 11/03/2016  . Left-sided weakness 11/03/2016  . Dementia (HCC) 11/03/2016  . 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 TIA (transient ischemic attack) 11/02/2016  . Bronchiectasis without acute exacerbation (HCC) 10/17/2013  . Seizures (HCC) 09/19/2013  . Encounter for long-term (current) use of other medications 09/19/2013  . Hypercalcemia 09/19/2013  . Atrial fibrillation (HCC) 08/14/2013  . Congestive heart failure, unspecified 08/14/2013  .  Hypopotassemia 08/14/2013  . Seizure disorder (HCC) 07/14/2013  . Schizophrenia, chronic condition (HCC) 07/14/2013  . Hypertension 07/14/2013  . Sepsis (HCC) 07/14/2013  . UTI (urinary tract infection) 07/14/2013  . Altered mental status 04/01/2013  . Anemia 03/31/2013  . Status epilepticus (HCC) 03/28/2013  . Acute respiratory failure (HCC) 03/28/2013  . Diabetes mellitus without complication (HCC) 03/28/2013    History reviewed. No pertinent surgical history.  Prior to Admission medications   Medication Sig Start Date End Date Taking? Authorizing Provider  azithromycin (ZITHROMAX Z-PAK) 250 MG tablet Take 1 tablet (250 mg total) by mouth daily for 4 days. Take 2 tablets (500 mg) on  Day 1,  followed by 1 tablet (250 mg) once daily on Days 2 through 5. 09/27/20 10/01/20 Yes Shaune Pollack, MD  cefpodoxime (VANTIN) 200 MG tablet Take 1 tablet (200 mg total) by mouth 2 (two) times daily for 7 days. 09/26/20 10/03/20 Yes Shaune Pollack, MD  acetaminophen (TYLENOL) 500 MG tablet Take 500 mg by mouth every 6 (six) hours as needed for pain.    [provider]  aspirin EC 81 MG tablet Take 81 mg by mouth daily.    [provider]  atorvastatin (LIPITOR) 40 MG tablet Take 1 tablet (40 mg total) by mouth daily at 6 PM. Patient taking differently: Take 40 mg by mouth every evening.  11/03/16   Katharina Caper, MD  B Complex-C (B-COMPLEX WITH VITAMIN C) tablet Take 1 tablet by mouth daily.  [provider]  benztropine (COGENTIN) 0.5 MG tablet Take 0.5 mg by mouth 2 (two) times daily.    [provider]  calcium carbonate (TUMS - DOSED IN MG ELEMENTAL CALCIUM) 500 MG chewable tablet Chew 1 tablet by mouth 2 (two) times daily.    [provider]  Chloroxylenol-Zinc Oxide (BAZA EX) Apply topically See admin instructions. Apply barrier cream topically to right buttock lesion after each clean    [provider]  cyclobenzaprine (FLEXERIL) 5 MG tablet  Take 5 mg by mouth 2 (two) times daily as needed for muscle spasms.    [provider]  famotidine (PEPCID) 20 MG tablet Take 20 mg by mouth at bedtime.    [provider]  fluPHENAZine (PROLIXIN) 5 MG tablet Take 5 mg by mouth 2 (two) times daily. At 1400 and 2000    [provider]  fluPHENAZine decanoate (PROLIXIN) 25 MG/ML injection Inject into the muscle every 14 (fourteen) days.    [provider]  lacosamide (VIMPAT) 200 MG TABS tablet Take one tablet by mouth twice daily 08/15/13   Sharon Seller, NP  lamoTRIgine (LAMICTAL) 200 MG tablet Take 200 mg by mouth 2 (two) times daily.     [provider]  levETIRAcetam (KEPPRA) 1000 MG tablet Take 1 tablet (1,000 mg total) by mouth 2 (two) times daily. 07/14/13   Street, Stephanie Coup, MD  metoprolol (LOPRESSOR) 50 MG tablet Take 50 mg by mouth 2 (two) times daily. Do not administer if pulse <50    [provider]  oxybutynin (DITROPAN) 5 MG tablet Take 5 mg by mouth 2 (two) times daily.    [provider]  phenytoin (DILANTIN) 100 MG ER capsule Take 100 mg by mouth 2 (two) times daily.     [provider]  polyethylene glycol (MIRALAX / GLYCOLAX) 17 g packet Take 17 g by mouth daily.    [provider]  thiamine (VITAMIN B-1) 100 MG tablet Take 100 mg by mouth daily.    [provider]    Allergies Depakote [valproic acid], Haldol [haloperidol lactate], Penicillins, Shellfish allergy, and Trileptal [oxcarbazepine]  No family history on file.  Social History Social History   Tobacco Use  . Smoking status: Current Every Day Smoker    Packs/day: 1.00    Years: 50.00    Pack years: 50.00    Types: Cigarettes  . Smokeless tobacco: Never Used    Review of Systems  Review of Systems  Unable to perform ROS: Dementia     ____________________________________________  PHYSICAL EXAM:      VITAL SIGNS: ED Triage Vitals  Enc Vitals Group      BP 09/26/20 0923 107/64     Pulse Rate 09/26/20 0923 (!) 56     Resp 09/26/20 0923 18     Temp 09/26/20 0923 97.7 F (36.5 C)     Temp Source 09/26/20 0923 Oral     SpO2 09/26/20 0923 98 %     Weight 09/26/20 0924 180 lb (81.6 kg)     Height 09/26/20 0924 5\' 5"  (1.651 m)     Head Circumference --      Peak Flow --      Pain Score 09/26/20 0926 0     Pain Loc --      Pain Edu? --      Excl. in GC? --      Physical Exam Vitals and nursing note reviewed.  Constitutional:  General: She is not in acute distress.    Appearance: She is well-developed and well-nourished.  HENT:     Head: Normocephalic and atraumatic.  Eyes:     Conjunctiva/sclera: Conjunctivae normal.  Cardiovascular:     Rate and Rhythm: Normal rate and regular rhythm.     Heart sounds: Normal heart sounds.  Pulmonary:     Effort: Pulmonary effort is normal. No respiratory distress.     Breath sounds: No wheezing.  Abdominal:     General: There is no distension.  Musculoskeletal:        General: No edema.     Cervical back: Neck supple.  Skin:    General: Skin is warm.     Capillary Refill: Capillary refill takes less than 2 seconds.     Findings: No rash.  Neurological:     Mental Status: She is alert. She is disoriented.     Motor: No abnormal muscle tone.     Comments: Oriented to person but not place or time. Follows commands. Strength 5/5 bl UE and LE. Normal sensation to light touch.       ____________________________________________   LABS (all labs ordered are listed, but only abnormal results are displayed)  Labs Reviewed  COMPREHENSIVE METABOLIC PANEL - Abnormal; Notable for the following components:      Result Value   Glucose, Bld 144 (*)    All other components within normal limits  CBC - Abnormal; Notable for the following components:   MCV 101.2 (*)    All other components within normal limits  PHENYTOIN LEVEL, TOTAL - Abnormal; Notable for the following components:    Phenytoin Lvl 4.3 (*)    All other components within normal limits  CBG MONITORING, ED - Abnormal; Notable for the following components:   Glucose-Capillary 120 (*)    All other components within normal limits  SARS CORONAVIRUS 2 (TAT 6-24 HRS)  CULTURE, BLOOD (ROUTINE X 2)  CULTURE, BLOOD (ROUTINE X 2)  URINALYSIS, COMPLETE (UACMP) WITH MICROSCOPIC  TROPONIN I (HIGH SENSITIVITY)  TROPONIN I (HIGH SENSITIVITY)    ____________________________________________  EKG: Sinus bradycardia, VR 48. PR 164, QRS 88, QTc 412. No acute ST elevations or depressions. ________________________________________  RADIOLOGY All imaging, including plain films, CT scans, and ultrasounds, independently reviewed by me, and interpretations confirmed via formal radiology reads.  ED MD interpretation:   Chest x-ray: Possible subtle right basilar pneumonia  Official radiology report(s): CT Head Wo Contrast  Result Date: 09/26/2020 CLINICAL DATA:  Altered mental status.  Possible seizure. EXAM: CT HEAD WITHOUT CONTRAST TECHNIQUE: Contiguous axial images were obtained from the base of the skull through the vertex without intravenous contrast. COMPARISON:  Head CT 07/03/2020 FINDINGS: Brain: Stable degree of atrophy and chronic small vessel ischemia. Remote lacunar infarct in the right caudate and basal ganglia. No intracranial hemorrhage, mass effect, or midline shift. No hydrocephalus. The basilar cisterns are patent. No evidence of territorial infarct or acute ischemia. Enlarged partially empty sella is unchanged from prior. No extra-axial or intracranial fluid collection. Vascular: Atherosclerosis of skullbase vasculature without hyperdense vessel or abnormal calcification. Skull: No fracture or focal lesion. 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 Sinuses/Orbits: Paranasal sinuses and mastoid air cells are clear. The visualized orbits are unremarkable. Other: None. IMPRESSION: 1. No acute intracranial abnormality. 2.  Stable atrophy and chronic small vessel ischemia. 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 Electronically Signed   By: Narda RutherfordMelanie  Sanford M.D.   On: 09/26/2020 15:02   DG  Chest Portable 1 View  Result Date: 09/26/2020 CLINICAL DATA:  Altered mental status EXAM: PORTABLE CHEST 1 VIEW COMPARISON:  September 23, 2020 FINDINGS: There is subtle ill-defined opacity in the right base. Lungs elsewhere are clear. Heart size and pulmonary vascularity are normal. No adenopathy. There is degenerative change in the shoulders. IMPRESSION: Subtle ill-defined opacity right base, likely due to focus of pneumonia. Lungs otherwise clear. Heart size within normal limits. Electronically Signed   By: Bretta Bang III M.D.   On: 09/26/2020 13:35    ____________________________________________  PROCEDURES   Procedure(s) performed (including Critical Care):  Procedures  ____________________________________________  INITIAL IMPRESSION / MDM / ASSESSMENT AND PLAN / ED COURSE  As part of my medical decision making, I reviewed the following data within the electronic MEDICAL RECORD NUMBER Nursing notes reviewed and incorporated, Old chart reviewed, Notes from prior ED visits, and [place] Controlled Substance Database       *[name] was evaluated in Emergency Department on 09/26/2020 for the symptoms described in the history of present illness. She was evaluated in the context of the global COVID-19 pandemic, which necessitated consideration that the patient might be at risk for infection with the SARS-CoV-2 virus that causes COVID-19. Institutional protocols and algorithms that pertain to the evaluation of patients at risk for COVID-19 are in a state of rapid change based on information released by regulatory bodies including the CDC and federal and state organizations. These policies and algorithms were followed during the patient's care in the ED.  Some ED evaluations and interventions may be  delayed as a result of limited staffing during the pandemic.*     Medical Decision Making: Well-appearing 75 year old female here with possible breakthrough seizure. On arrival, patient is at her reported mental baseline. No focal neurological deficits. She has a well-known seizure disorder. She does appear to be slightly subtherapeutic on her Dilantin level based on lab work, and we will give her an IV load here in addition to her usual meds. Of note, the patient has a possible basilar pneumonia on chest x-ray. This is somewhat incidental as she reportedly has not had any cough, is afebrile, not tachypneic, and satting greater than 95% on room air. Given that this could partially contribute to breakthrough seizure, will elect to treat empirically with antibiotics as she does not appear septic or seem to require admission for this. Otherwise, her lab work is very reassuring at baseline. Renal function is at baseline. Will plan to give IV Dilantin load, started on antibiotics, discharged back to her facility with close follow-up.  Patient signed out to Dr. Vicente Males. Plan to load w/ dilantin, give dose of abx, and reassess. If she remains well appearing, not hypoxic, at baseline, can likely manage as outpt. ABX called in for broad-spectrum empiric coverage.  ____________________________________________  FINAL CLINICAL IMPRESSION(S) / ED DIAGNOSES  Final diagnoses:  Breakthrough seizure (HCC)  Atypical pneumonia     MEDICATIONS GIVEN DURING THIS VISIT:  Medications  cefTRIAXone (ROCEPHIN) 2 g in sodium chloride 0.9 % 100 mL IVPB (has no administration in time range)  azithromycin (ZITHROMAX) 500 mg in sodium chloride 0.9 % 250 mL IVPB (has no administration in time range)  lacosamide (VIMPAT) tablet 200 mg (has no administration in time range)  phenytoin (DILANTIN) 1,000 mg in sodium chloride 0.9 % 250 mL IVPB (has no administration in time range)  levETIRAcetam (KEPPRA) tablet 1,000 mg (1,000  mg Oral Given 09/26/20 1428)     ED Discharge Orders  Ordered    azithromycin (ZITHROMAX Z-PAK) 250 MG tablet  Daily        09/26/20 1531    cefpodoxime (VANTIN) 200 MG tablet  2 times daily        09/26/20 1531           Note:  This document was prepared using Dragon voice recognition software and may include unintentional dictation errors.   Shaune Pollack, MD 09/26/20 1606

## 2020-09-26 NOTE — ED Notes (Signed)
First RN note:  Pt comes into the ED via [facility] from [facility] c/o possible seizures.  Pt was found not responding to staff and was staring off with her tongue out.  By the time patient was placed in EMS truck patient was responding and back to baseline. Pt has h/o seizures.  Pt has hep A, B, and C, schizophrenia, dementia, CHF, DM, a-fib, and asthma.

## 2020-09-26 NOTE — ED Notes (Signed)
Pt transported to CT at this time.

## 2020-09-26 NOTE — Discharge Instructions (Addendum)
Ms. Thies labs were overall very reassuring today.  Her dilantin level was slightly low, so she was given a load of this here  Continue home keppra, vimpat, and dilantin  She had a small spot of possible pneumonia on chest xray. She has been given abx for this today and will need to start an outpatient course on discharge.

## 2020-09-26 NOTE — ED Notes (Signed)
Pt agitated, wants to go home. Pt states that she does not believe she has pneumonia, and does not want IV abx. Pt states she believes that the xray findings are from her smoking. Pt also states she does not care about urinary tract infection. Pt declines cultures, states that it is her right to go home, that we can't do anything to her, and that the IV is uncomfortable. Refuses blood draw for cultures.   EDP notified, IV removed by patient upon return. PO abx ordered.   On call social worker paged for pt d/c, will call ems for transport

## 2020-09-26 NOTE — ED Notes (Signed)
Legal guardian updated per off-duty number. [facility] called for report, no further questions.   Will call EMS for trx back

## 2020-09-26 NOTE — ED Triage Notes (Signed)
BIB EMS; pt had possible syncopal episode/seizure with hx of seizures? Pt alert to self at this time. disoriented to situation, place and time. Denies pain.

## 2020-09-26 NOTE — ED Notes (Signed)
Lab called for cultures, IV team unable to obtain blood from IV access

## 2020-09-27 LAB — SARS CORONAVIRUS 2 (TAT 6-24 HRS): SARS Coronavirus 2: NEGATIVE

## 2020-10-27 ENCOUNTER — Encounter: Payer: Self-pay | Admitting: Emergency Medicine

## 2020-10-27 ENCOUNTER — Other Ambulatory Visit: Payer: Self-pay

## 2020-10-27 ENCOUNTER — Emergency Department
Admission: EM | Admit: 2020-10-27 | Discharge: 2020-10-27 | Disposition: A | Discharge status: Home / Self Care | Payer: Medicare Other | Attending: Emergency Medicine | Admitting: Emergency Medicine

## 2020-10-27 ENCOUNTER — Emergency Department: Payer: Medicare Other

## 2020-10-27 DIAGNOSIS — W050XXA Fall from non-moving wheelchair, initial encounter: Secondary | ICD-10-CM | POA: Insufficient documentation

## 2020-10-27 DIAGNOSIS — F17210 Nicotine dependence, cigarettes, uncomplicated: Secondary | ICD-10-CM | POA: Insufficient documentation

## 2020-10-27 DIAGNOSIS — I5032 Chronic diastolic (congestive) heart failure: Secondary | ICD-10-CM | POA: Insufficient documentation

## 2020-10-27 DIAGNOSIS — S0990XA Unspecified injury of head, initial encounter: Secondary | ICD-10-CM | POA: Diagnosis present

## 2020-10-27 DIAGNOSIS — E119 Type 2 diabetes mellitus without complications: Secondary | ICD-10-CM | POA: Diagnosis not present

## 2020-10-27 DIAGNOSIS — Z79899 Other long term (current) drug therapy: Secondary | ICD-10-CM | POA: Diagnosis not present

## 2020-10-27 DIAGNOSIS — I110 Hypertensive heart disease with heart failure: Secondary | ICD-10-CM | POA: Insufficient documentation

## 2020-10-27 DIAGNOSIS — S0083XA Contusion of other part of head, initial encounter: Secondary | ICD-10-CM

## 2020-10-27 DIAGNOSIS — F0390 Unspecified dementia without behavioral disturbance: Secondary | ICD-10-CM | POA: Insufficient documentation

## 2020-10-27 DIAGNOSIS — J45909 Unspecified asthma, uncomplicated: Secondary | ICD-10-CM | POA: Insufficient documentation

## 2020-10-27 DIAGNOSIS — Z7982 Long term (current) use of aspirin: Secondary | ICD-10-CM | POA: Insufficient documentation

## 2020-10-27 DIAGNOSIS — S0181XA Laceration without foreign body of other part of head, initial encounter: Secondary | ICD-10-CM

## 2020-10-27 LAB — URINALYSIS, COMPLETE (UACMP) WITH MICROSCOPIC
Bilirubin Urine: NEGATIVE
Glucose, UA: NEGATIVE mg/dL
Hgb urine dipstick: NEGATIVE
Ketones, ur: NEGATIVE mg/dL
Nitrite: POSITIVE — AB
Protein, ur: 30 mg/dL — AB
Specific Gravity, Urine: 1.015 (ref 1.005–1.030)
WBC, UA: >50 WBC/hpf — ABNORMAL HIGH (ref 0–5)
pH: 6 (ref 5.0–8.0)

## 2020-10-27 MED ORDER — NAPROXEN 500 MG PO TABS: 500.0000 mg | ORAL_TABLET | Freq: Once | ORAL | Status: DC

## 2020-10-27 MED ORDER — BACITRACIN-NEOMYCIN-POLYMYXIN 400-5-5000 EX OINT: TOPICAL_OINTMENT | Freq: Once | CUTANEOUS | Status: AC

## 2020-10-27 NOTE — ED Provider Notes (Signed)
[facility] Emergency Department Provider Note   ____________________________________________   Event Date/Time   First MD Initiated Contact with Patient 10/27/20 [phone]     (approximate)  I have reviewed the triage vital signs and the nursing notes.   HISTORY  Chief Complaint Fall    HPI [name] is a 75 y.o. female patient arrived via EMS status post fall from a wheelchair.  Patient sustained a small laceration lateral aspect left supraorbital area.  Patient also has an abrasion at the inferior left orbital area.  LOC or head.  Patient denies pain at this time.  Bleeding controlled by direct pressure.         Past Medical History:  Diagnosis Date  . Asthma   . CHF (congestive heart failure) (HCC)   . Diabetes mellitus without complication (HCC)   . Hepatitis A   . Hepatitis B   . Hepatitis C   . Hypertension   . Respiratory failure (HCC)   . Schizophrenia (HCC)   . Seizures (HCC)   . Smoker    1 pack daily    Patient Active Problem List   Diagnosis Date Noted  . Acute delirium 09/26/2019  . Abnormal urinalysis 09/26/2019  . Person under investigation for COVID-19 09/26/2019  . Asthma   . Chronic diastolic heart failure (HCC)   . Schizophrenia (HCC)   . Hyperlipidemia 11/03/2016  . Left-sided weakness 11/03/2016  . Dementia (HCC) 11/03/2016  . 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 TIA (transient ischemic attack) 11/02/2016  . Bronchiectasis without acute exacerbation (HCC) 10/17/2013  . Seizures (HCC) 09/19/2013  . Encounter for long-term (current) use of other medications 09/19/2013  . Hypercalcemia 09/19/2013  . Atrial fibrillation (HCC) 08/14/2013  . Congestive heart failure, unspecified 08/14/2013  . Hypopotassemia 08/14/2013  . Seizure disorder (HCC) 07/14/2013  . Schizophrenia, chronic condition (HCC) 07/14/2013  . Hypertension 07/14/2013  . Sepsis (HCC) 07/14/2013  . UTI (urinary tract infection) 07/14/2013  . Altered  mental status 04/01/2013  . Anemia 03/31/2013  . Status epilepticus (HCC) 03/28/2013  . Acute respiratory failure (HCC) 03/28/2013  . Diabetes mellitus without complication (HCC) 03/28/2013    History reviewed. No pertinent surgical history.  Prior to Admission medications   Medication Sig Start Date End Date Taking? Authorizing Provider  acetaminophen (TYLENOL) 500 MG tablet Take 500 mg by mouth every 6 (six) hours as needed for pain.    [provider]  aspirin EC 81 MG tablet Take 81 mg by mouth daily.    [provider]  atorvastatin (LIPITOR) 40 MG tablet Take 1 tablet (40 mg total) by mouth daily at 6 PM. Patient taking differently: Take 40 mg by mouth every evening.  11/03/16   Katharina Caper, MD  B Complex-C (B-COMPLEX WITH VITAMIN C) tablet Take 1 tablet by mouth daily.    [provider]  benztropine (COGENTIN) 0.5 MG tablet Take 0.5 mg by mouth 2 (two) times daily.    [provider]  calcium carbonate (TUMS - DOSED IN MG ELEMENTAL CALCIUM) 500 MG chewable tablet Chew 1 tablet by mouth 2 (two) times daily.    [provider]  Chloroxylenol-Zinc Oxide (BAZA EX) Apply topically See admin instructions. Apply barrier cream topically to right buttock lesion after each clean    [provider]  cyclobenzaprine (FLEXERIL) 5 MG tablet Take 5 mg by mouth 2 (two) times daily as needed for muscle spasms.    [provider]  famotidine (PEPCID) 20 MG  tablet Take 20 mg by mouth at bedtime.    [provider]  fluPHENAZine (PROLIXIN) 5 MG tablet Take 5 mg by mouth 2 (two) times daily. At 1400 and 2000    [provider]  fluPHENAZine decanoate (PROLIXIN) 25 MG/ML injection Inject into the muscle every 14 (fourteen) days.    [provider]  lacosamide (VIMPAT) 200 MG TABS tablet Take one tablet by mouth twice daily 08/15/13   Sharon Seller, NP  lamoTRIgine (LAMICTAL) 200 MG tablet Take 200 mg by mouth 2  (two) times daily.     [provider]  levETIRAcetam (KEPPRA) 1000 MG tablet Take 1 tablet (1,000 mg total) by mouth 2 (two) times daily. 07/14/13   Street, Stephanie Coup, MD  metoprolol (LOPRESSOR) 50 MG tablet Take 50 mg by mouth 2 (two) times daily. Do not administer if pulse <50    [provider]  oxybutynin (DITROPAN) 5 MG tablet Take 5 mg by mouth 2 (two) times daily.    [provider]  phenytoin (DILANTIN) 100 MG ER capsule Take 100 mg by mouth 2 (two) times daily.     [provider]  polyethylene glycol (MIRALAX / GLYCOLAX) 17 g packet Take 17 g by mouth daily.    [provider]  thiamine (VITAMIN B-1) 100 MG tablet Take 100 mg by mouth daily.    [provider]    Allergies Depakote [valproic acid], Haldol [haloperidol lactate], Penicillins, Shellfish allergy, and Trileptal [oxcarbazepine]  No family history on file.  Social History Social History   Tobacco Use  . Smoking status: Current Every Day Smoker    Packs/day: 1.00    Years: 50.00    Pack years: 50.00    Types: Cigarettes  . Smokeless tobacco: Never Used    Review of Systems Constitutional: No fever/chills Eyes: No visual changes. ENT: No sore throat. Cardiovascular: Denies chest pain. Respiratory: Denies shortness of breath. Gastrointestinal: No abdominal pain.  No nausea, no vomiting.  No diarrhea.  No constipation. Genitourinary: Negative for dysuria. Musculoskeletal: Negative for back pain. Skin: Negative for rash.  Facial laceration and left periorbital edema.. Neurological: Negative for headaches, focal weakness or numbness. Psychiatric:  Schizophrenia. Endocrine:  Diabetes, hyperlipidemia, and hypertension. Hematological/Lymphatic:  Allergic/Immunilogical: Depakote, Haldol, penicillin, shellfish, and Trileptal. ____________________________________________   PHYSICAL EXAM:  VITAL SIGNS: ED Triage Vitals  Enc Vitals Group     BP 10/27/20  0841 140/69     Pulse Rate 10/27/20 0841 67     Resp 10/27/20 0841 18     Temp 10/27/20 0841 (!) 97.5 F (36.4 C)     Temp Source 10/27/20 0841 Oral     SpO2 10/27/20 0841 97 %     Weight 10/27/20 0842 80 lb (36.3 kg)     Height 10/27/20 0842 5' 0.5" (1.537 m)     Head Circumference --      Peak Flow --      Pain Score 10/27/20 0842 0     Pain Loc --      Pain Edu? --      Excl. in GC? --    Constitutional: Alert and oriented. Well appearing and in no acute distress. Eyes: Conjunctivae are normal. PERRL. EOMI. Head: Atraumatic. Nose: No congestion/rhinnorhea. Mouth/Throat: Mucous membranes are moist.  Oropharynx non-erythematous. Neck: No stridor.  No cervical spine tenderness to palpation. Hematological/Lymphatic/Immunilogical: No cervical lymphadenopathy. Cardiovascular: Normal rate, regular rhythm. Grossly normal heart sounds.  Good peripheral circulation. Respiratory: Normal respiratory effort.  No retractions. Lungs CTAB. Gastrointestinal: Soft and nontender. No distention. No abdominal bruits. No CVA tenderness. Genitourinary: Deferred Musculoskeletal: No lower extremity tenderness nor edema.  No joint effusions. Neurologic:  Normal speech and language. No gross focal neurologic deficits are appreciated. No gait instability. Skin:  Skin is warm, dry and intact. No rash noted. Psychiatric: Mood and affect are normal. Speech and behavior are normal.  ____________________________________________   LABS (all labs ordered are listed, but only abnormal results are displayed)  Labs Reviewed  URINALYSIS, COMPLETE (UACMP) WITH MICROSCOPIC - Abnormal; Notable for the following components:      Result Value   Color, Urine YELLOW (*)    APPearance CLOUDY (*)    Protein, ur 30 (*)    Nitrite POSITIVE (*)    Leukocytes,Ua LARGE (*)    WBC, UA >50 (*)    Bacteria, UA FEW (*)    All other components within normal limits    ____________________________________________  EKG   ____________________________________________  RADIOLOGY I, Joni Reiningonald K Kaelei Wheeler, personally viewed and evaluated these images (plain radiographs) as part of my medical decision making, as well as reviewing the written report by the radiologist.  ED MD interpretation:    Official radiology report(s): No results found.  ____________________________________________   PROCEDURES  Procedure(s) performed (including Critical Care):  [name].[name].Laceration Repair  Date/Time: 10/27/2020 10:12 AM Performed by: Joni ReiningSmith, Jabron Weese K, PA-C Authorized by: Joni ReiningSmith, Floye Fesler K, PA-C   Consent:    Consent obtained:  Verbal   Consent given by:  Patient   Risks, benefits, and alternatives were discussed: yes     Risks discussed:  Infection, pain and poor cosmetic result Universal protocol:    Procedure explained and questions answered to patient or proxy's satisfaction: yes     Relevant documents present and verified: yes     Imaging studies available: yes     Immediately prior to procedure, a time out was called: yes     Patient identity confirmed:  Verbally with patient and arm band Anesthesia:    Anesthesia method:  None Laceration details:    Location:  Face   Face location:  Forehead   Length (cm):  1.5   Depth (mm):  1 Pre-procedure details:    Preparation:  Patient was prepped and draped in usual sterile fashion Exploration:    Hemostasis achieved with:  Direct pressure Treatment:    Area cleansed with:  Povidone-iodine and saline   Amount of cleaning:  Standard   Debridement:  None   Undermining:  None Skin repair:    Repair method:  Tissue adhesive Approximation:    Approximation:  Close Repair type:    Repair type:  Simple Post-procedure details:    Dressing:  Open (no dressing)   Procedure completion:  Tolerated well, no immediate complications     ____________________________________________   INITIAL IMPRESSION /  ASSESSMENT AND PLAN / ED COURSE  As part of my medical decision making, I reviewed the following data within the electronic MEDICAL RECORD NUMBER         Patient presents with facial contusion laceration secondary to fall from wheelchair.  There was no LOC.  See procedure note for wound closure.  Discussed no acute findings on CT.  Patient given discharge care instruction advised follow-up PCP.      ____________________________________________   FINAL CLINICAL IMPRESSION(S) / ED DIAGNOSES  Final diagnoses:  Facial laceration, initial encounter  Contusion of face, initial encounter     ED Discharge Orders  None      *Please note:  ARAMINTA ZORN was evaluated in Emergency Department on 10/28/2020 for the symptoms described in the history of present illness. She was evaluated in the context of the global COVID-19 pandemic, which necessitated consideration that the patient might be at risk for infection with the SARS-CoV-2 virus that causes COVID-19. Institutional protocols and algorithms that pertain to the evaluation of patients at risk for COVID-19 are in a state of rapid change based on information released by regulatory bodies including the CDC and federal and state organizations. These policies and algorithms were followed during the patient's care in the ED.  Some ED evaluations and interventions may be delayed as a result of limited staffing during and the pandemic.*   Note:  This document was prepared using Dragon voice recognition software and may include unintentional dictation errors.    Joni Reining, PA-C 10/28/20 1538    Delton Prairie, MD 10/30/20 [phone]

## 2020-10-27 NOTE — ED Notes (Signed)
Report called to [facility]

## 2020-10-27 NOTE — Discharge Instructions (Addendum)
No acute findings on CT of the maxillofacial area.  Follow discharge care instructions.

## 2020-10-27 NOTE — ED Triage Notes (Signed)
Presents via EMS  S/p fall  Per EMS she fell face first from w/c  Abrasion noted under left eye with small laceration over eye  No LOC   Denies any other pain

## 2020-12-25 ENCOUNTER — Other Ambulatory Visit: Payer: Self-pay

## 2020-12-25 ENCOUNTER — Emergency Department: Payer: Medicare Other

## 2020-12-25 ENCOUNTER — Emergency Department
Admission: EM | Admit: 2020-12-25 | Discharge: 2020-12-26 | Disposition: A | Discharge status: Home / Self Care | Payer: Medicare Other | Attending: Emergency Medicine | Admitting: Emergency Medicine

## 2020-12-25 DIAGNOSIS — I110 Hypertensive heart disease with heart failure: Secondary | ICD-10-CM | POA: Insufficient documentation

## 2020-12-25 DIAGNOSIS — E119 Type 2 diabetes mellitus without complications: Secondary | ICD-10-CM | POA: Insufficient documentation

## 2020-12-25 DIAGNOSIS — Z7982 Long term (current) use of aspirin: Secondary | ICD-10-CM | POA: Insufficient documentation

## 2020-12-25 DIAGNOSIS — S0181XA Laceration without foreign body of other part of head, initial encounter: Secondary | ICD-10-CM | POA: Insufficient documentation

## 2020-12-25 DIAGNOSIS — J45909 Unspecified asthma, uncomplicated: Secondary | ICD-10-CM | POA: Insufficient documentation

## 2020-12-25 DIAGNOSIS — F17210 Nicotine dependence, cigarettes, uncomplicated: Secondary | ICD-10-CM | POA: Insufficient documentation

## 2020-12-25 DIAGNOSIS — W01198A Fall on same level from slipping, tripping and stumbling with subsequent striking against other object, initial encounter: Secondary | ICD-10-CM | POA: Diagnosis not present

## 2020-12-25 DIAGNOSIS — W19XXXA Unspecified fall, initial encounter: Secondary | ICD-10-CM

## 2020-12-25 DIAGNOSIS — I509 Heart failure, unspecified: Secondary | ICD-10-CM | POA: Insufficient documentation

## 2020-12-25 DIAGNOSIS — S0990XA Unspecified injury of head, initial encounter: Secondary | ICD-10-CM

## 2020-12-25 DIAGNOSIS — E041 Nontoxic single thyroid nodule: Secondary | ICD-10-CM | POA: Insufficient documentation

## 2020-12-25 MED ORDER — ACETAMINOPHEN 500 MG PO TABS
1000.0000 mg | ORAL_TABLET | Freq: Once | ORAL | Status: AC
  Administered 2020-12-26: 1000 mg via ORAL
  Filled 2020-12-25: qty 2

## 2020-12-25 NOTE — ED Triage Notes (Signed)
Pt brought in by EMS from [facility], assisted living; reports fell out of w/c , laceration above rt eye

## 2020-12-25 NOTE — Discharge Instructions (Addendum)
You were seen in the emergency department after a fall. Luckily all of your imaging studies did not show any evidence of injuries. Follow-up with you doctor within the next 2-3 days for further evaluation. Sometimes injuries can present at a later time and therefore it is imperative that you return to the emergency room if you have a severe headache, facial droop, neck pain, numbness or weakness of your extremities, slurred speech, difficulty finding words, chest pain, back pain, abdominal pain, or any other new symptoms that were not present during this visit. You may take Tylenol at home for your pain.  As we discussed, your CT did show a thyroid nodule for which she will need an ultrasound.  Make sure to call your primary care doctor in the morning for an appointment so this ultrasound can be done.

## 2020-12-25 NOTE — ED Provider Notes (Signed)
[facility] Emergency Department Provider Note  ____________________________________________  Time seen: Approximately 11:54 PM  I have reviewed the triage vital signs and the nursing notes.   HISTORY  Chief Complaint Head Injury   HPI [name] is a 75 y.o. female with history as listed below who presents for evaluation of head trauma.  Patient reports that she was sitting on her wheelchair and was getting assistance in trying to put her pants on.  She reports that the pants were too tight and got stuck in her legs.  She was maneuvering to try to get the pants on when she lost her balance and fell off the wheelchair.  She fell face forward onto the floor.  She is complaining of a mild throbbing headache and a laceration to the right forehead.  She is not on blood thinners.  She denies LOC.  She denies any other pain at this time including no neck pain, back pain, chest pain, abdominal pain, extremity pain.  She does describe the fall was mechanical in nature.   Past Medical History:  Diagnosis Date  . Asthma   . CHF (congestive heart failure) (HCC)   . Diabetes mellitus without complication (HCC)   . Hepatitis A   . Hepatitis B   . Hepatitis C   . Hypertension   . Respiratory failure (HCC)   . Schizophrenia (HCC)   . Seizures (HCC)   . Smoker    1 pack daily    Patient Active Problem List   Diagnosis Date Noted  . Acute delirium 09/26/2019  . Abnormal urinalysis 09/26/2019  . Person under investigation for COVID-19 09/26/2019  . Asthma   . Chronic diastolic heart failure (HCC)   . Schizophrenia (HCC)   . Hyperlipidemia 11/03/2016  . Left-sided weakness 11/03/2016  . Dementia (HCC) 11/03/2016  . 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 TIA (transient ischemic attack) 11/02/2016  . Bronchiectasis without acute exacerbation (HCC) 10/17/2013  . Seizures (HCC) 09/19/2013  . Encounter for long-term (current) use of other medications 09/19/2013  .  Hypercalcemia 09/19/2013  . Atrial fibrillation (HCC) 08/14/2013  . Congestive heart failure, unspecified 08/14/2013  . Hypopotassemia 08/14/2013  . Seizure disorder (HCC) 07/14/2013  . Schizophrenia, chronic condition (HCC) 07/14/2013  . Hypertension 07/14/2013  . Sepsis (HCC) 07/14/2013  . UTI (urinary tract infection) 07/14/2013  . Altered mental status 04/01/2013  . Anemia 03/31/2013  . Status epilepticus (HCC) 03/28/2013  . Acute respiratory failure (HCC) 03/28/2013  . Diabetes mellitus without complication (HCC) 03/28/2013    No past surgical history on file.  Prior to Admission medications   Medication Sig Start Date End Date Taking? Authorizing Provider  acetaminophen (TYLENOL) 500 MG tablet Take 500 mg by mouth every 6 (six) hours as needed for pain.    [provider]  aspirin EC 81 MG tablet Take 81 mg by mouth daily.    [provider]  atorvastatin (LIPITOR) 40 MG tablet Take 1 tablet (40 mg total) by mouth daily at 6 PM. Patient taking differently: Take 40 mg by mouth every evening.  11/03/16   Katharina Caper, MD  B Complex-C (B-COMPLEX WITH VITAMIN C) tablet Take 1 tablet by mouth daily.    [provider]  benztropine (COGENTIN) 0.5 MG tablet Take 0.5 mg by mouth 2 (two) times daily.    [provider]  calcium carbonate (TUMS - DOSED IN MG ELEMENTAL CALCIUM) 500 MG chewable tablet Chew 1 tablet by mouth 2 (two) times  daily.    [provider]  Chloroxylenol-Zinc Oxide (BAZA EX) Apply topically See admin instructions. Apply barrier cream topically to right buttock lesion after each clean    [provider]  cyclobenzaprine (FLEXERIL) 5 MG tablet Take 5 mg by mouth 2 (two) times daily as needed for muscle spasms.    [provider]  famotidine (PEPCID) 20 MG tablet Take 20 mg by mouth at bedtime.    [provider]  fluPHENAZine (PROLIXIN) 5 MG tablet Take 5 mg by mouth 2 (two) times daily. At 1400 and  2000    [provider]  fluPHENAZine decanoate (PROLIXIN) 25 MG/ML injection Inject into the muscle every 14 (fourteen) days.    [provider]  lacosamide (VIMPAT) 200 MG TABS tablet Take one tablet by mouth twice daily 08/15/13   Sharon Seller, NP  lamoTRIgine (LAMICTAL) 200 MG tablet Take 200 mg by mouth 2 (two) times daily.     [provider]  levETIRAcetam (KEPPRA) 1000 MG tablet Take 1 tablet (1,000 mg total) by mouth 2 (two) times daily. 07/14/13   Street, Stephanie Coup, MD  metoprolol (LOPRESSOR) 50 MG tablet Take 50 mg by mouth 2 (two) times daily. Do not administer if pulse <50    [provider]  oxybutynin (DITROPAN) 5 MG tablet Take 5 mg by mouth 2 (two) times daily.    [provider]  phenytoin (DILANTIN) 100 MG ER capsule Take 100 mg by mouth 2 (two) times daily.     [provider]  polyethylene glycol (MIRALAX / GLYCOLAX) 17 g packet Take 17 g by mouth daily.    [provider]  thiamine (VITAMIN B-1) 100 MG tablet Take 100 mg by mouth daily.    [provider]    Allergies Depakote [valproic acid], Haldol [haloperidol lactate], Penicillins, Shellfish allergy, and Trileptal [oxcarbazepine]  No family history on file.  Social History Social History   Tobacco Use  . Smoking status: Current Every Day Smoker    Packs/day: 1.00    Years: 50.00    Pack years: 50.00    Types: Cigarettes  . Smokeless tobacco: Never Used    Review of Systems  Constitutional: Negative for fever. Eyes: Negative for visual changes. ENT: Negative for facial injury or neck injury Cardiovascular: Negative for chest injury. Respiratory: Negative for shortness of breath. Negative for chest wall injury. Gastrointestinal: Negative for abdominal pain or injury. Genitourinary: Negative for dysuria. Musculoskeletal: Negative for back injury, negative for arm or leg pain. Skin: Negative for  laceration/abrasions. Neurological: + head injury.   ____________________________________________   PHYSICAL EXAM:  VITAL SIGNS: Vitals:   12/25/20 2027 12/25/20 2245  BP: (!) 191/69 (!) 170/87  Pulse: 66 66  Resp: 20 20  Temp: 98 F (36.7 C)   SpO2: 100% 98%    Full spinal precautions maintained throughout the trauma exam. Constitutional: Alert and oriented. No acute distress. Does not appear intoxicated. HEENT Head: Normocephalic and atraumatic. Face: No facial bony tenderness. Stable midface, 2cm linear R eyebrow laceration Ears: No hemotympanum bilaterally. No Battle sign Eyes: No eye injury. PERRL. No raccoon eyes Nose: Nontender. No epistaxis. No rhinorrhea Mouth/Throat: Mucous membranes are moist. No oropharyngeal blood. No dental injury. Airway patent without stridor. Normal voice. Neck: no C-collar. No midline c-spine tenderness.  Cardiovascular: Normal rate, regular rhythm. Normal and symmetric distal pulses are present in all extremities. Pulmonary/Chest: Chest wall is stable and nontender to palpation/compression. Normal respiratory effort. Breath sounds are normal.  No crepitus.  Abdominal: Soft, nontender, non distended. Musculoskeletal: Nontender with normal full range of motion in all extremities. No deformities. No thoracic or lumbar midline spinal tenderness. Pelvis is stable. Skin: Skin is warm, dry and intact. No abrasions or contutions. Psychiatric: Speech and behavior are appropriate. Neurological: Normal speech and language. Moves all extremities to command. No gross focal neurologic deficits are appreciated.  Glascow Coma Score: 4 - Opens eyes on own 6 - Follows simple motor commands 5 - Alert and oriented GCS: 15   ____________________________________________   LABS (all labs ordered are listed, but only abnormal results are displayed)  Labs Reviewed - No data to display ____________________________________________  EKG  none   ____________________________________________  RADIOLOGY  I have personally reviewed the images performed during this visit and I agree with the Radiologist's read.   Interpretation by Radiologist:  CT Head Wo Contrast  Result Date: 12/25/2020 CLINICAL DATA:  Fall with head and neck pain. EXAM: CT HEAD WITHOUT CONTRAST CT CERVICAL SPINE WITHOUT CONTRAST TECHNIQUE: Multidetector CT imaging of the head and cervical spine was performed following the standard protocol without intravenous contrast. Multiplanar CT image reconstructions of the cervical spine were also generated. COMPARISON:  CT head dated 09/26/2020 and CT cervical spine dated 07/03/2020. FINDINGS: CT HEAD FINDINGS Brain: No evidence of acute infarction, hemorrhage, hydrocephalus, extra-axial collection or mass lesion/mass effect. A lacunar infarct in the right caudate is redemonstrated. Periventricular white matter hypoattenuation likely represents chronic small vessel ischemic disease. Vascular: There are vascular calcifications in the carotid siphons. Skull: Normal. Negative for fracture or focal lesion. Sinuses/Orbits: No acute finding. Other: Soft tissue swelling overlies the right forehead and periorbital region. CT CERVICAL SPINE FINDINGS Alignment: Normal. Skull base and vertebrae: No acute fracture. No primary bone lesion or focal pathologic process. Soft tissues and spinal canal: No prevertebral fluid or swelling. No visible canal hematoma. Disc levels:  Multilevel degenerative disc and joint disease. Upper chest: Negative. Other: A 1.7 cm hypoattenuating nodules in the right thyroid lobe. IMPRESSION: 1. No acute intracranial process. 2. No acute osseous injury in the cervical spine. 3. A 1.7 cm hypoattenuating nodules in the right thyroid lobe. Recommend non emergent thyroid US. (ref: J Am Coll Radiol. 2015 Feb;12(2): 143-50). Electronically Signed   By: Romona Curls M.D.   On: 12/25/2020 21:04   CT Cervical Spine Wo  Contrast  Result Date: 12/25/2020 CLINICAL DATA:  Fall with head and neck pain. EXAM: CT HEAD WITHOUT CONTRAST CT CERVICAL SPINE WITHOUT CONTRAST TECHNIQUE: Multidetector CT imaging of the head and cervical spine was performed following the standard protocol without intravenous contrast. Multiplanar CT image reconstructions of the cervical spine were also generated. COMPARISON:  CT head dated 09/26/2020 and CT cervical spine dated 07/03/2020. FINDINGS: CT HEAD FINDINGS Brain: No evidence of acute infarction, hemorrhage, hydrocephalus, extra-axial collection or mass lesion/mass effect. A lacunar infarct in the right caudate is redemonstrated. Periventricular white matter hypoattenuation likely represents chronic small vessel ischemic disease. Vascular: There are vascular calcifications in the carotid siphons. Skull: Normal. Negative for fracture or focal lesion. Sinuses/Orbits: No acute finding. Other: Soft tissue swelling overlies the right forehead and periorbital region. CT CERVICAL SPINE FINDINGS Alignment: Normal. Skull base and vertebrae: No acute fracture. No primary bone lesion or focal pathologic process. Soft tissues and spinal canal: No prevertebral fluid or swelling. No visible canal hematoma. Disc levels:  Multilevel degenerative disc and joint disease. Upper chest: Negative. Other: A 1.7 cm hypoattenuating nodules in the right thyroid lobe. IMPRESSION: 1.  No acute intracranial process. 2. No acute osseous injury in the cervical spine. 3. A 1.7 cm hypoattenuating nodules in the right thyroid lobe. Recommend non emergent thyroid US. (ref: J Am Coll Radiol. 2015 Feb;12(2): 143-50). Electronically Signed   By: Romona Curlsyler  Litton M.D.   On: 12/25/2020 21:04     ____________________________________________   PROCEDURES  Procedure(s) performed: yes .[name].Laceration Repair  Date/Time: 12/25/2020 11:57 PM Performed by: Nita SickleVeronese, [place], MD Authorized by: Nita SickleVeronese, [place], MD   Consent:    Consent  obtained:  Verbal   Consent given by:  Patient   Risks discussed:  Infection, pain, retained foreign body, poor cosmetic result and poor wound healing Anesthesia:    Anesthesia method:  None Laceration details:    Location:  Face   Face location:  R eyebrow   Length (cm):  2 Exploration:    Hemostasis achieved with:  Direct pressure   Imaging outcome: foreign body not noted     Wound exploration: entire depth of wound visualized     Wound extent: no fascia violation noted, no foreign bodies/material noted, no underlying fracture noted and no vascular damage noted     Contaminated: no   Treatment:    Area cleansed with:  Saline   Amount of cleaning:  Extensive   Irrigation solution:  Sterile saline   Visualized foreign bodies/material removed: no   Skin repair:    Repair method:  Steri-Strips and tissue adhesive   Number of Steri-Strips:  5 Approximation:    Approximation:  Close Repair type:    Repair type:  Simple Post-procedure details:    Dressing:  Open (no dressing)   Procedure completion:  Tolerated well, no immediate complications   Critical Care performed:  None ____________________________________________   INITIAL IMPRESSION / ASSESSMENT AND PLAN / ED COURSE   75 y.o. female with history as listed below who presents for evaluation of head trauma after mechanical fall from her wheelchair.  She is extremely well-appearing in no distress with normal vital signs, no signs or symptoms of basilar skull fracture.  She has a linear laceration to the right eyebrow which I recommended be repaired with stitches but patient declined and requested to be repaired with glue.  Laceration was repaired with Dermabond and Steri-Strips per procedure note above.  Her last tetanus shot is from 2018.  She was given Tylenol for a mild headache.  There is no other injuries based on history and physical exam.  CT of the head and C-spine were visualized by me with no acute traumatic injuries,  confirmed by radiology.  There is an incidental finding of a nodule in the right thyroid.  Discussed this finding with patient and recommended outpatient ultrasound.  Old medical records reviewed.        ____________________________________________  Please note:  Patient was evaluated in Emergency Department today for the symptoms described in the history of present illness. Patient was evaluated in the context of the global COVID-19 pandemic, which necessitated consideration that the patient might be at risk for infection with the SARS-CoV-2 virus that causes COVID-19. Institutional protocols and algorithms that pertain to the evaluation of patients at risk for COVID-19 are in a state of rapid change based on information released by regulatory bodies including the CDC and federal and state organizations. These policies and algorithms were followed during the patient's care in the ED.  Some ED evaluations and interventions may be delayed as a result of limited staffing during the pandemic.   ____________________________________________  FINAL CLINICAL IMPRESSION(S) / ED DIAGNOSES   Final diagnoses:  Fall, initial encounter  Injury of head, initial encounter  Laceration of forehead, initial encounter  Thyroid nodule      NEW MEDICATIONS STARTED DURING THIS VISIT:  ED Discharge Orders    None       Note:  This document was prepared using Dragon voice recognition software and may include unintentional dictation errors.    Don Perking, [place], MD 12/26/20 0000

## 2020-12-25 NOTE — ED Triage Notes (Addendum)
Pt states she was trying to get out of wheelchair, states was trying to call for help but no one from group home came. She fell out of wheelchair hitting right forehead on floor. No loc, bleeding controlled at this time. Laceration noted to right eyebrow with bleeding controlled.

## 2021-03-12 ENCOUNTER — Other Ambulatory Visit: Payer: Self-pay

## 2021-03-12 ENCOUNTER — Emergency Department
Admission: EM | Admit: 2021-03-12 | Discharge: 2021-03-12 | Disposition: A | Discharge status: Skilled Nursing Facility | Payer: Medicare Other | Attending: Emergency Medicine | Admitting: Emergency Medicine

## 2021-03-12 ENCOUNTER — Emergency Department: Payer: Medicare Other

## 2021-03-12 ENCOUNTER — Encounter: Payer: Self-pay | Admitting: Pharmacy Technician

## 2021-03-12 DIAGNOSIS — I5032 Chronic diastolic (congestive) heart failure: Secondary | ICD-10-CM | POA: Insufficient documentation

## 2021-03-12 DIAGNOSIS — Z8673 Personal history of transient ischemic attack (TIA), and cerebral infarction without residual deficits: Secondary | ICD-10-CM | POA: Insufficient documentation

## 2021-03-12 DIAGNOSIS — Z79899 Other long term (current) drug therapy: Secondary | ICD-10-CM | POA: Insufficient documentation

## 2021-03-12 DIAGNOSIS — J45909 Unspecified asthma, uncomplicated: Secondary | ICD-10-CM | POA: Diagnosis not present

## 2021-03-12 DIAGNOSIS — F0390 Unspecified dementia without behavioral disturbance: Secondary | ICD-10-CM | POA: Insufficient documentation

## 2021-03-12 DIAGNOSIS — R000 Tachycardia, unspecified: Secondary | ICD-10-CM | POA: Insufficient documentation

## 2021-03-12 DIAGNOSIS — F17210 Nicotine dependence, cigarettes, uncomplicated: Secondary | ICD-10-CM | POA: Insufficient documentation

## 2021-03-12 DIAGNOSIS — R569 Unspecified convulsions: Secondary | ICD-10-CM

## 2021-03-12 DIAGNOSIS — Z7982 Long term (current) use of aspirin: Secondary | ICD-10-CM | POA: Diagnosis not present

## 2021-03-12 DIAGNOSIS — Z8616 Personal history of COVID-19: Secondary | ICD-10-CM | POA: Diagnosis not present

## 2021-03-12 DIAGNOSIS — I110 Hypertensive heart disease with heart failure: Secondary | ICD-10-CM | POA: Diagnosis not present

## 2021-03-12 DIAGNOSIS — G40901 Epilepsy, unspecified, not intractable, with status epilepticus: Secondary | ICD-10-CM | POA: Diagnosis not present

## 2021-03-12 DIAGNOSIS — N390 Urinary tract infection, site not specified: Secondary | ICD-10-CM | POA: Diagnosis not present

## 2021-03-12 DIAGNOSIS — E119 Type 2 diabetes mellitus without complications: Secondary | ICD-10-CM | POA: Diagnosis not present

## 2021-03-12 DIAGNOSIS — G40909 Epilepsy, unspecified, not intractable, without status epilepticus: Secondary | ICD-10-CM | POA: Diagnosis not present

## 2021-03-12 LAB — COMPREHENSIVE METABOLIC PANEL
ALT: 26 U/L (ref 0–44)
AST: 37 U/L (ref 15–41)
Albumin: 4.1 g/dL (ref 3.5–5.0)
Alkaline Phosphatase: 101 U/L (ref 38–126)
Anion gap: 12 (ref 5–15)
BUN: 10 mg/dL (ref 8–23)
CO2: 26 mmol/L (ref 22–32)
Calcium: 9.6 mg/dL (ref 8.9–10.3)
Chloride: 100 mmol/L (ref 98–111)
Creatinine, Ser: 0.47 mg/dL (ref 0.44–1.00)
GFR, Estimated: >60 mL/min (ref >60)
Glucose, Bld: 141 mg/dL — ABNORMAL HIGH (ref 70–99)
Potassium: 4.3 mmol/L (ref 3.5–5.1)
Sodium: 138 mmol/L (ref 135–145)
Total Bilirubin: 0.9 mg/dL (ref 0.3–1.2)
Total Protein: 7.4 g/dL (ref 6.5–8.1)

## 2021-03-12 LAB — URINALYSIS, COMPLETE (UACMP) WITH MICROSCOPIC
Bilirubin Urine: NEGATIVE
Glucose, UA: NEGATIVE mg/dL
Ketones, ur: NEGATIVE mg/dL
Nitrite: POSITIVE — AB
Protein, ur: 100 mg/dL — AB
Specific Gravity, Urine: 1.014 (ref 1.005–1.030)
pH: 7 (ref 5.0–8.0)

## 2021-03-12 LAB — CBC
HCT: 42.4 % (ref 36.0–46.0)
Hemoglobin: 14.1 g/dL (ref 12.0–15.0)
MCH: 32.6 pg (ref 26.0–34.0)
MCHC: 33.3 g/dL (ref 30.0–36.0)
MCV: 98.1 fL (ref 80.0–100.0)
Platelets: 209 10*3/uL (ref 150–400)
RBC: 4.32 MIL/uL (ref 3.87–5.11)
RDW: 12.8 % (ref 11.5–15.5)
WBC: 5.9 10*3/uL (ref 4.0–10.5)
nRBC: 0 % (ref 0.0–0.2)

## 2021-03-12 LAB — CBG MONITORING, ED: Glucose-Capillary: 145 mg/dL — ABNORMAL HIGH (ref 70–99)

## 2021-03-12 MED ORDER — LEVETIRACETAM IN NACL 1500 MG/100ML IV SOLN
1500.0000 mg | Freq: Once | INTRAVENOUS | Status: AC
  Administered 2021-03-12: 1500 mg via INTRAVENOUS
  Filled 2021-03-12: qty 100

## 2021-03-12 MED ORDER — SODIUM CHLORIDE 0.9 % IV SOLN
1.0000 g | Freq: Once | INTRAVENOUS | Status: AC
  Administered 2021-03-12: 1 g via INTRAVENOUS
  Filled 2021-03-12: qty 10

## 2021-03-12 MED ORDER — CLONIDINE HCL 0.1 MG PO TABS
0.1000 mg | ORAL_TABLET | Freq: Once | ORAL | Status: AC
  Administered 2021-03-12: 0.1 mg via ORAL
  Filled 2021-03-12: qty 1

## 2021-03-12 MED ORDER — CEPHALEXIN 500 MG PO CAPS: 500.0000 mg | ORAL_CAPSULE | Freq: Two times a day (BID) | ORAL | 0 refills | Status: AC

## 2021-03-12 NOTE — ED Notes (Signed)
Called [facility] for transport to [facility] [phone]

## 2021-03-12 NOTE — ED Triage Notes (Signed)
Pt bib ems from [facility] with reports of witnessed seizure lasting approx 2-3 minutes. On scene pt post ictal with tremors. Given 2mg  Versed IM with some improvement. Tremors returned and given 2mg  more IV versed. Pt altered on arrival. Staff reported to EMS that pt has refused her medications for the last few days.  170/90 HR 110 96% RA

## 2021-03-12 NOTE — Discharge Instructions (Addendum)
Please take antibiotic as prescribed for its entire course.  Please begin taking your seizure medications as written by your doctor.  Return to the emergency department for any further seizures or any other symptoms personally concerning to yourself.

## 2021-03-12 NOTE — ED Notes (Signed)
Patient transported to CT 

## 2021-03-12 NOTE — ED Provider Notes (Addendum)
[facility] Emergency Department Provider Note  Time seen: 11:32 AM  I have reviewed the triage vital signs and the nursing notes.   HISTORY  Chief Complaint Seizures   HPI [name] is a 75 y.o. female with a past medical history of asthma, CHF, diabetes, hypertension, schizophrenia, seizures, presents to the emergency department for a seizure.  According to EMS for patient had a witnessed seizure at her care facility.  Patient was given Versed by EMS.  In route to the hospital patient did have a mild tremor of her right upper extremity was given 2 mg of Versed again.  Upon arrival patient continues to have a mild right extremity tremor at times however no sign of ongoing seizure.  Patient is able to look at me able to answer questions.   Past Medical History:  Diagnosis Date   Asthma    CHF (congestive heart failure) (HCC)    Diabetes mellitus without complication (HCC)    Hepatitis A    Hepatitis B    Hepatitis C    Hypertension    Respiratory failure (HCC)    Schizophrenia (HCC)    Seizures (HCC)    Smoker    1 pack daily    Patient Active Problem List   Diagnosis Date Noted   Acute delirium 09/26/2019   Abnormal urinalysis 09/26/2019   Person under investigation for COVID-19 09/26/2019   Asthma    Chronic diastolic heart failure (HCC)    Schizophrenia (HCC)    Hyperlipidemia 11/03/2016   Left-sided weakness 11/03/2016   Dementia (HCC) 11/03/2016   MRSA carrier 11/03/2016   TIA (transient ischemic attack) 11/02/2016   Bronchiectasis without acute exacerbation (HCC) 10/17/2013   Seizures (HCC) 09/19/2013   Encounter for long-term (current) use of other medications 09/19/2013   Hypercalcemia 09/19/2013   Atrial fibrillation (HCC) 08/14/2013   Congestive heart failure, unspecified 08/14/2013   Hypopotassemia 08/14/2013   Seizure disorder (HCC) 07/14/2013   Schizophrenia, chronic condition (HCC) 07/14/2013   Hypertension 07/14/2013    Sepsis (HCC) 07/14/2013   UTI (urinary tract infection) 07/14/2013   Altered mental status 04/01/2013   Anemia 03/31/2013   Status epilepticus (HCC) 03/28/2013   Acute respiratory failure (HCC) 03/28/2013   Diabetes mellitus without complication (HCC) 03/28/2013    No past surgical history on file.  Prior to Admission medications   Medication Sig Start Date End Date Taking? Authorizing Provider  acetaminophen (TYLENOL) 500 MG tablet Take 500 mg by mouth every 6 (six) hours as needed for pain.    [provider]  aspirin EC 81 MG tablet Take 81 mg by mouth daily.    [provider]  atorvastatin (LIPITOR) 40 MG tablet Take 1 tablet (40 mg total) by mouth daily at 6 PM. Patient taking differently: Take 40 mg by mouth every evening.  11/03/16   Katharina Caper, MD  B Complex-C (B-COMPLEX WITH VITAMIN C) tablet Take 1 tablet by mouth daily.    [provider]  benztropine (COGENTIN) 0.5 MG tablet Take 0.5 mg by mouth 2 (two) times daily.    [provider]  calcium carbonate (TUMS - DOSED IN MG ELEMENTAL CALCIUM) 500 MG chewable tablet Chew 1 tablet by mouth 2 (two) times daily.    [provider]  Chloroxylenol-Zinc Oxide (BAZA EX) Apply topically See admin instructions. Apply barrier cream topically to right buttock lesion after each clean    [provider]  cyclobenzaprine (FLEXERIL) 5 MG tablet Take 5 mg  by mouth 2 (two) times daily as needed for muscle spasms.    [provider]  famotidine (PEPCID) 20 MG tablet Take 20 mg by mouth at bedtime.    [provider]  fluPHENAZine (PROLIXIN) 5 MG tablet Take 5 mg by mouth 2 (two) times daily. At 1400 and 2000    [provider]  fluPHENAZine decanoate (PROLIXIN) 25 MG/ML injection Inject into the muscle every 14 (fourteen) days.    [provider]  lacosamide (VIMPAT) 200 MG TABS tablet Take one tablet by mouth twice daily 08/15/13   Sharon Seller, NP   lamoTRIgine (LAMICTAL) 200 MG tablet Take 200 mg by mouth 2 (two) times daily.     [provider]  levETIRAcetam (KEPPRA) 1000 MG tablet Take 1 tablet (1,000 mg total) by mouth 2 (two) times daily. 07/14/13   Street, Stephanie Coup, MD  metoprolol (LOPRESSOR) 50 MG tablet Take 50 mg by mouth 2 (two) times daily. Do not administer if pulse <50    [provider]  oxybutynin (DITROPAN) 5 MG tablet Take 5 mg by mouth 2 (two) times daily.    [provider]  phenytoin (DILANTIN) 100 MG ER capsule Take 100 mg by mouth 2 (two) times daily.     [provider]  polyethylene glycol (MIRALAX / GLYCOLAX) 17 g packet Take 17 g by mouth daily.    [provider]  thiamine (VITAMIN B-1) 100 MG tablet Take 100 mg by mouth daily.    [provider]    Allergies  Allergen Reactions   Depakote [Valproic Acid] Other (See Comments)    unknown   Haldol [Haloperidol Lactate] Other (See Comments)    Unknown    Penicillins Other (See Comments)    Unknown    Shellfish Allergy Other (See Comments)    unknown   Trileptal [Oxcarbazepine] Other (See Comments)    unknown    No family history on file.  Social History Social History   Tobacco Use   Smoking status: Every Day    Packs/day: 1.00    Years: 50.00    Pack years: 50.00    Types: Cigarettes   Smokeless tobacco: Never    Review of Systems Unable to obtain adequate/accurate review of systems secondary to postictal state and possible baseline mental state  ____________________________________________   PHYSICAL EXAM:  VITAL SIGNS: ED Triage Vitals [03/12/21 1114]  Enc Vitals Group     BP (!) 177/117     Pulse Rate 80     Resp 16     Temp (!) 97 F (36.1 C)     Temp src      SpO2 97 %     Weight      Height      Head Circumference      Peak Flow      Pain Score      Pain Loc      Pain Edu?      Excl. in GC?    Constitutional: Somewhat somnolent does awaken easily to  voice, will look if she will answer simple questions.   Eyes: Overall normal appearance, no gaze deviation. ENT      Head: Normocephalic and atraumatic.      Mouth/Throat: Mucous membranes are moist. Cardiovascular: Normal rate, regular rhythm.  Respiratory: Normal respiratory effort without tachypnea nor retractions. Breath sounds are clear Gastrointestinal: Soft and nontender. No distention. Musculoskeletal: Nontender with normal range of motion in all extremities.  Neurologic:  Normal speech and language.  Moves all extremities.  Does have a right upper extremity mild tremor at times. Skin:  Skin is warm, dry  Psychiatric: Mood and affect are normal.   ____________________________________________    EKG  EKG viewed and interpreted by myself shows a sinus tachycardia 124 bpm with a narrow QRS, normal axis, normal intervals, nonspecific ST changes  ____________________________________________    RADIOLOGY  CT head negative for acute abnormality.  Old infarct.  ____________________________________________   INITIAL IMPRESSION / ASSESSMENT AND PLAN / ED COURSE  Pertinent labs & imaging results that were available during my care of the patient were reviewed by me and considered in my medical decision making (see chart for details).   Patient presents emergency department after a seizure at her care facility.  Per report patient has refused taking her medications over the past 2 to 3 days.  EMS gave the patient a total of 4 mg of Versed.  Here the patient appears well she is somewhat somnolent but awakens to voice answers questions, no sign of any ongoing seizure activity.  As the patient has refused her medications recently we will load with IV Keppra, we will check labs and continue to closely monitor the patient.  Patient is doing much better, much more responsive.  Patient's work-up is reassuring.  Patient is quite hypertensive currently 212 systolic.  We will dose a one-time  dose of clonidine and obtain a CT scan of the head as a precaution.  As long as patient's blood pressure improved and CT is reassuring anticipate likely discharge home.  We will discharge with antibiotics for her urinary tract infection.    CT scans negative.  Patient continues to appear well.  We will discharge the patient home with routine follow-up.  We will discharge with antibiotics.  [name] was evaluated in Emergency Department on 03/12/2021 for the symptoms described in the history of present illness. She was evaluated in the context of the global COVID-19 pandemic, which necessitated consideration that the patient might be at risk for infection with the SARS-CoV-2 virus that causes COVID-19. Institutional protocols and algorithms that pertain to the evaluation of patients at risk for COVID-19 are in a state of rapid change based on information released by regulatory bodies including the CDC and federal and state organizations. These policies and algorithms were followed during the patient's care in the ED.  ____________________________________________   FINAL CLINICAL IMPRESSION(S) / ED DIAGNOSES  Seizure Urinary tract infection Minna Antis, MD 03/12/21 1452    Minna Antis, MD 03/12/21 1530

## 2021-03-12 NOTE — ED Notes (Signed)
Pt unable to sign for discharge due to AMS.  

## 2021-03-12 NOTE — ED Notes (Signed)
Pt resting comfortably at this time.

## 2021-03-13 ENCOUNTER — Emergency Department: Payer: Medicare Other

## 2021-03-13 ENCOUNTER — Emergency Department
Admission: EM | Admit: 2021-03-13 | Discharge: 2021-03-13 | Disposition: A | Discharge status: Other Acute Inpatient Hospital | Payer: Medicare Other | Attending: Emergency Medicine | Admitting: Emergency Medicine

## 2021-03-13 ENCOUNTER — Inpatient Hospital Stay (HOSPITAL_COMMUNITY): Payer: Medicare Other

## 2021-03-13 ENCOUNTER — Other Ambulatory Visit: Payer: Self-pay

## 2021-03-13 ENCOUNTER — Inpatient Hospital Stay (HOSPITAL_COMMUNITY)
Admission: EM | Admit: 2021-03-13 | Discharge: 2021-03-30 | DRG: 871 | Disposition: E | Payer: Medicare Other | Source: Other Acute Inpatient Hospital | Attending: Pulmonary Disease | Admitting: Pulmonary Disease

## 2021-03-13 ENCOUNTER — Encounter: Payer: Self-pay | Admitting: Emergency Medicine

## 2021-03-13 DIAGNOSIS — I214 Non-ST elevation (NSTEMI) myocardial infarction: Secondary | ICD-10-CM | POA: Diagnosis present

## 2021-03-13 DIAGNOSIS — E785 Hyperlipidemia, unspecified: Secondary | ICD-10-CM | POA: Diagnosis present

## 2021-03-13 DIAGNOSIS — A419 Sepsis, unspecified organism: Secondary | ICD-10-CM | POA: Diagnosis present

## 2021-03-13 DIAGNOSIS — B9620 Unspecified Escherichia coli [E. coli] as the cause of diseases classified elsewhere: Secondary | ICD-10-CM | POA: Diagnosis present

## 2021-03-13 DIAGNOSIS — N179 Acute kidney failure, unspecified: Secondary | ICD-10-CM | POA: Diagnosis present

## 2021-03-13 DIAGNOSIS — N390 Urinary tract infection, site not specified: Secondary | ICD-10-CM | POA: Diagnosis present

## 2021-03-13 DIAGNOSIS — J9601 Acute respiratory failure with hypoxia: Secondary | ICD-10-CM | POA: Diagnosis present

## 2021-03-13 DIAGNOSIS — G40801 Other epilepsy, not intractable, with status epilepticus: Secondary | ICD-10-CM | POA: Diagnosis present

## 2021-03-13 DIAGNOSIS — G40901 Epilepsy, unspecified, not intractable, with status epilepticus: Secondary | ICD-10-CM | POA: Diagnosis present

## 2021-03-13 DIAGNOSIS — E119 Type 2 diabetes mellitus without complications: Secondary | ICD-10-CM

## 2021-03-13 DIAGNOSIS — R6520 Severe sepsis without septic shock: Secondary | ICD-10-CM | POA: Insufficient documentation

## 2021-03-13 DIAGNOSIS — I110 Hypertensive heart disease with heart failure: Secondary | ICD-10-CM | POA: Diagnosis present

## 2021-03-13 DIAGNOSIS — E872 Acidosis: Secondary | ICD-10-CM | POA: Diagnosis present

## 2021-03-13 DIAGNOSIS — Z66 Do not resuscitate: Secondary | ICD-10-CM | POA: Diagnosis not present

## 2021-03-13 DIAGNOSIS — R6521 Severe sepsis with septic shock: Secondary | ICD-10-CM | POA: Diagnosis present

## 2021-03-13 DIAGNOSIS — D6489 Other specified anemias: Secondary | ICD-10-CM | POA: Diagnosis present

## 2021-03-13 DIAGNOSIS — E778 Other disorders of glycoprotein metabolism: Secondary | ICD-10-CM | POA: Diagnosis present

## 2021-03-13 DIAGNOSIS — I952 Hypotension due to drugs: Secondary | ICD-10-CM | POA: Diagnosis not present

## 2021-03-13 DIAGNOSIS — Z20822 Contact with and (suspected) exposure to covid-19: Secondary | ICD-10-CM | POA: Diagnosis present

## 2021-03-13 DIAGNOSIS — E1165 Type 2 diabetes mellitus with hyperglycemia: Secondary | ICD-10-CM | POA: Diagnosis present

## 2021-03-13 DIAGNOSIS — D6959 Other secondary thrombocytopenia: Secondary | ICD-10-CM | POA: Diagnosis not present

## 2021-03-13 DIAGNOSIS — F17210 Nicotine dependence, cigarettes, uncomplicated: Secondary | ICD-10-CM | POA: Diagnosis present

## 2021-03-13 DIAGNOSIS — J9690 Respiratory failure, unspecified, unspecified whether with hypoxia or hypercapnia: Secondary | ICD-10-CM

## 2021-03-13 DIAGNOSIS — Z79899 Other long term (current) drug therapy: Secondary | ICD-10-CM | POA: Diagnosis not present

## 2021-03-13 DIAGNOSIS — G9340 Encephalopathy, unspecified: Secondary | ICD-10-CM

## 2021-03-13 DIAGNOSIS — R9431 Abnormal electrocardiogram [ECG] [EKG]: Secondary | ICD-10-CM | POA: Diagnosis not present

## 2021-03-13 DIAGNOSIS — G40411 Other generalized epilepsy and epileptic syndromes, intractable, with status epilepticus: Secondary | ICD-10-CM | POA: Diagnosis not present

## 2021-03-13 DIAGNOSIS — I5032 Chronic diastolic (congestive) heart failure: Secondary | ICD-10-CM | POA: Diagnosis present

## 2021-03-13 DIAGNOSIS — Z91013 Allergy to seafood: Secondary | ICD-10-CM

## 2021-03-13 DIAGNOSIS — J45909 Unspecified asthma, uncomplicated: Secondary | ICD-10-CM | POA: Diagnosis present

## 2021-03-13 DIAGNOSIS — Z7982 Long term (current) use of aspirin: Secondary | ICD-10-CM | POA: Diagnosis not present

## 2021-03-13 DIAGNOSIS — J9600 Acute respiratory failure, unspecified whether with hypoxia or hypercapnia: Secondary | ICD-10-CM | POA: Diagnosis present

## 2021-03-13 DIAGNOSIS — F0390 Unspecified dementia without behavioral disturbance: Secondary | ICD-10-CM | POA: Insufficient documentation

## 2021-03-13 DIAGNOSIS — Z515 Encounter for palliative care: Secondary | ICD-10-CM

## 2021-03-13 DIAGNOSIS — T426X5A Adverse effect of other antiepileptic and sedative-hypnotic drugs, initial encounter: Secondary | ICD-10-CM | POA: Diagnosis not present

## 2021-03-13 DIAGNOSIS — E8809 Other disorders of plasma-protein metabolism, not elsewhere classified: Secondary | ICD-10-CM | POA: Diagnosis present

## 2021-03-13 DIAGNOSIS — R509 Fever, unspecified: Secondary | ICD-10-CM | POA: Diagnosis present

## 2021-03-13 DIAGNOSIS — Z8616 Personal history of COVID-19: Secondary | ICD-10-CM | POA: Insufficient documentation

## 2021-03-13 DIAGNOSIS — R569 Unspecified convulsions: Secondary | ICD-10-CM

## 2021-03-13 DIAGNOSIS — Z9114 Patient's other noncompliance with medication regimen: Secondary | ICD-10-CM

## 2021-03-13 DIAGNOSIS — F209 Schizophrenia, unspecified: Secondary | ICD-10-CM | POA: Diagnosis present

## 2021-03-13 DIAGNOSIS — E861 Hypovolemia: Secondary | ICD-10-CM | POA: Diagnosis present

## 2021-03-13 DIAGNOSIS — I213 ST elevation (STEMI) myocardial infarction of unspecified site: Secondary | ICD-10-CM | POA: Diagnosis not present

## 2021-03-13 DIAGNOSIS — I454 Nonspecific intraventricular block: Secondary | ICD-10-CM | POA: Diagnosis present

## 2021-03-13 DIAGNOSIS — R410 Disorientation, unspecified: Secondary | ICD-10-CM | POA: Diagnosis present

## 2021-03-13 DIAGNOSIS — R4182 Altered mental status, unspecified: Secondary | ICD-10-CM | POA: Diagnosis present

## 2021-03-13 DIAGNOSIS — E876 Hypokalemia: Secondary | ICD-10-CM | POA: Diagnosis present

## 2021-03-13 DIAGNOSIS — Z8673 Personal history of transient ischemic attack (TIA), and cerebral infarction without residual deficits: Secondary | ICD-10-CM

## 2021-03-13 DIAGNOSIS — N3000 Acute cystitis without hematuria: Secondary | ICD-10-CM | POA: Diagnosis not present

## 2021-03-13 LAB — BLOOD GAS, ARTERIAL
Acid-base deficit: 1.5 mmol/L (ref 0.0–2.0)
Bicarbonate: 21.7 mmol/L (ref 20.0–28.0)
FIO2: 0.32
MECHVT: 400 mL
O2 Saturation: 98.5 %
PEEP: 5 cmH2O
Patient temperature: 37
RATE: 16 resp/min
pCO2 arterial: 32 mmHg (ref 32.0–48.0)
pH, Arterial: 7.44 (ref 7.350–7.450)
pO2, Arterial: 111 mmHg — ABNORMAL HIGH (ref 83.0–108.0)

## 2021-03-13 LAB — LACTIC ACID, PLASMA
Lactic Acid, Venous: 1.8 mmol/L (ref 0.5–1.9)
Lactic Acid, Venous: 3.5 mmol/L (ref 0.5–1.9)
Lactic Acid, Venous: 2.7 mmol/L (ref 0.5–1.9)

## 2021-03-13 LAB — COMPREHENSIVE METABOLIC PANEL
ALT: 27 U/L (ref 0–44)
AST: 43 U/L — ABNORMAL HIGH (ref 15–41)
Albumin: 4.3 g/dL (ref 3.5–5.0)
Alkaline Phosphatase: 105 U/L (ref 38–126)
Anion gap: 15 (ref 5–15)
BUN: 34 mg/dL — ABNORMAL HIGH (ref 8–23)
CO2: 24 mmol/L (ref 22–32)
Calcium: 9.6 mg/dL (ref 8.9–10.3)
Chloride: 100 mmol/L (ref 98–111)
Creatinine, Ser: 1.29 mg/dL — ABNORMAL HIGH (ref 0.44–1.00)
GFR, Estimated: 44 mL/min — ABNORMAL LOW (ref >60)
Glucose, Bld: 166 mg/dL — ABNORMAL HIGH (ref 70–99)
Potassium: 3.9 mmol/L (ref 3.5–5.1)
Sodium: 139 mmol/L (ref 135–145)
Total Bilirubin: 1 mg/dL (ref 0.3–1.2)
Total Protein: 7.9 g/dL (ref 6.5–8.1)

## 2021-03-13 LAB — CBC WITH DIFFERENTIAL/PLATELET
Abs Immature Granulocytes: 0.05 10*3/uL (ref 0.00–0.07)
Basophils Absolute: 0 10*3/uL (ref 0.0–0.1)
Basophils Relative: 0 %
Eosinophils Absolute: 0 10*3/uL (ref 0.0–0.5)
Eosinophils Relative: 0 %
HCT: 47.1 % — ABNORMAL HIGH (ref 36.0–46.0)
Hemoglobin: 16.1 g/dL — ABNORMAL HIGH (ref 12.0–15.0)
Immature Granulocytes: 0 %
Lymphocytes Relative: 31 %
Lymphs Abs: 4.3 10*3/uL — ABNORMAL HIGH (ref 0.7–4.0)
MCH: 33.1 pg (ref 26.0–34.0)
MCHC: 34.2 g/dL (ref 30.0–36.0)
MCV: 96.7 fL (ref 80.0–100.0)
Monocytes Absolute: 0.8 10*3/uL (ref 0.1–1.0)
Monocytes Relative: 6 %
Neutro Abs: 8.5 10*3/uL — ABNORMAL HIGH (ref 1.7–7.7)
Neutrophils Relative %: 63 %
Platelets: 197 10*3/uL (ref 150–400)
RBC Morphology: ABNORMAL
RBC: 4.87 MIL/uL (ref 3.87–5.11)
RDW: 13.2 % (ref 11.5–15.5)
WBC Morphology: ABNORMAL
WBC: 13.7 10*3/uL — ABNORMAL HIGH (ref 4.0–10.5)
nRBC: 0 % (ref 0.0–0.2)

## 2021-03-13 LAB — AMMONIA: Ammonia: 21 umol/L (ref 9–35)

## 2021-03-13 LAB — RESP PANEL BY RT-PCR (FLU A&B, COVID) ARPGX2
Influenza A by PCR: NEGATIVE
Influenza B by PCR: NEGATIVE
SARS Coronavirus 2 by RT PCR: NEGATIVE

## 2021-03-13 LAB — URINALYSIS, COMPLETE (UACMP) WITH MICROSCOPIC
Bilirubin Urine: NEGATIVE
Glucose, UA: NEGATIVE mg/dL
Ketones, ur: 80 mg/dL — AB
Nitrite: POSITIVE — AB
Protein, ur: 100 mg/dL — AB
Specific Gravity, Urine: 1.014 (ref 1.005–1.030)
WBC, UA: >50 WBC/hpf — ABNORMAL HIGH (ref 0–5)
pH: 5 (ref 5.0–8.0)

## 2021-03-13 LAB — HEMOGLOBIN A1C
Hgb A1c MFr Bld: 6.6 % — ABNORMAL HIGH (ref 4.8–5.6)
Mean Plasma Glucose: 142.72 mg/dL

## 2021-03-13 LAB — CK TOTAL AND CKMB (NOT AT ARMC)
CK, MB: 7.8 ng/mL — ABNORMAL HIGH (ref 0.5–5.0)
Relative Index: 1.4 (ref 0.0–2.5)
Total CK: 543 U/L — ABNORMAL HIGH (ref 38–234)

## 2021-03-13 LAB — APTT: aPTT: 30 seconds (ref 24–36)

## 2021-03-13 LAB — PROTIME-INR
INR: 1 (ref 0.8–1.2)
Prothrombin Time: 13.3 seconds (ref 11.4–15.2)

## 2021-03-13 LAB — PHENYTOIN LEVEL, TOTAL: Phenytoin Lvl: <2.5 ug/mL — ABNORMAL LOW (ref 10.0–20.0)

## 2021-03-13 LAB — GLUCOSE, CAPILLARY: Glucose-Capillary: 104 mg/dL — ABNORMAL HIGH (ref 70–99)

## 2021-03-13 MED ORDER — LAMOTRIGINE 100 MG PO TABS
200.0000 mg | ORAL_TABLET | Freq: Two times a day (BID) | ORAL | Status: DC
  Administered 2021-03-14 – 2021-03-16 (×5): 200 mg
  Filled 2021-03-13 (×8): qty 2

## 2021-03-13 MED ORDER — BENZTROPINE MESYLATE 0.5 MG PO TABS
0.5000 mg | ORAL_TABLET | Freq: Two times a day (BID) | ORAL | Status: DC
  Administered 2021-03-14 – 2021-03-16 (×5): 0.5 mg
  Filled 2021-03-13 (×7): qty 1

## 2021-03-13 MED ORDER — FLUPHENAZINE HCL 5 MG PO TABS
5.0000 mg | ORAL_TABLET | Freq: Two times a day (BID) | ORAL | Status: DC
  Administered 2021-03-14 – 2021-03-16 (×5): 5 mg
  Filled 2021-03-13 (×7): qty 1

## 2021-03-13 MED ORDER — NOREPINEPHRINE 4 MG/250ML-% IV SOLN
INTRAVENOUS | Status: AC
  Administered 2021-03-13: 5 ug/min via INTRAVENOUS
  Filled 2021-03-13: qty 250

## 2021-03-13 MED ORDER — PROPOFOL 1000 MG/100ML IV EMUL
0.0000 ug/kg/min | INTRAVENOUS | Status: DC
Start: 2021-03-13 — End: 2021-03-17
  Administered 2021-03-13: 15 ug/kg/min via INTRAVENOUS
  Administered 2021-03-14 (×4): 85 ug/kg/min via INTRAVENOUS
  Administered 2021-03-14: 45 ug/kg/min via INTRAVENOUS
  Administered 2021-03-14: 75 ug/kg/min via INTRAVENOUS
  Administered 2021-03-15: 63.8 ug/kg/min via INTRAVENOUS
  Administered 2021-03-15: 85 ug/kg/min via INTRAVENOUS
  Administered 2021-03-15: 63.857 ug/kg/min via INTRAVENOUS
  Administered 2021-03-15 (×2): 85 ug/kg/min via INTRAVENOUS
  Administered 2021-03-15: 63.857 ug/kg/min via INTRAVENOUS
  Administered 2021-03-15: 85 ug/kg/min via INTRAVENOUS
  Administered 2021-03-16 (×5): 100 ug/kg/min via INTRAVENOUS
  Filled 2021-03-13 (×2): qty 100
  Filled 2021-03-13: qty 200
  Filled 2021-03-13 (×6): qty 100
  Filled 2021-03-13: qty 200
  Filled 2021-03-13: qty 100

## 2021-03-13 MED ORDER — ASPIRIN 81 MG PO CHEW: 81.0000 mg | CHEWABLE_TABLET | Freq: Every day | ORAL | Status: DC

## 2021-03-13 MED ORDER — ACETAMINOPHEN 650 MG RE SUPP
650.0000 mg | Freq: Once | RECTAL | Status: AC
  Administered 2021-03-13: 650 mg via RECTAL
  Filled 2021-03-13: qty 1

## 2021-03-13 MED ORDER — LEVETIRACETAM IN NACL 1500 MG/100ML IV SOLN
1500.0000 mg | Freq: Once | INTRAVENOUS | Status: AC
  Administered 2021-03-13: 1500 mg via INTRAVENOUS
  Filled 2021-03-13: qty 100

## 2021-03-13 MED ORDER — POLYETHYLENE GLYCOL 3350 17 G PO PACK: 17.0000 g | PACK | Freq: Every day | ORAL | Status: DC

## 2021-03-13 MED ORDER — SODIUM CHLORIDE 0.9 % IV SOLN
200.0000 mg | Freq: Two times a day (BID) | INTRAVENOUS | Status: DC
  Administered 2021-03-14 – 2021-03-16 (×6): 200 mg via INTRAVENOUS
  Filled 2021-03-13 (×7): qty 20

## 2021-03-13 MED ORDER — ETOMIDATE 2 MG/ML IV SOLN
15.0000 mg | Freq: Once | INTRAVENOUS | Status: AC
  Administered 2021-03-13: 15 mg via INTRAVENOUS

## 2021-03-13 MED ORDER — ORAL CARE MOUTH RINSE
15.0000 mL | OROMUCOSAL | Status: DC
  Administered 2021-03-14 – 2021-03-16 (×26): 15 mL via OROMUCOSAL

## 2021-03-13 MED ORDER — INSULIN ASPART 100 UNIT/ML IJ SOLN
0.0000 [IU] | INTRAMUSCULAR | Status: DC
  Administered 2021-03-14: 2 [IU] via SUBCUTANEOUS
  Administered 2021-03-14: 3 [IU] via SUBCUTANEOUS
  Administered 2021-03-14 (×2): 2 [IU] via SUBCUTANEOUS
  Administered 2021-03-14 – 2021-03-15 (×3): 3 [IU] via SUBCUTANEOUS

## 2021-03-13 MED ORDER — HEPARIN SODIUM (PORCINE) 5000 UNIT/ML IJ SOLN
5000.0000 [IU] | Freq: Three times a day (TID) | INTRAMUSCULAR | Status: DC
  Administered 2021-03-14: 5000 [IU] via SUBCUTANEOUS
  Filled 2021-03-13: qty 1

## 2021-03-13 MED ORDER — CHLORHEXIDINE GLUCONATE CLOTH 2 % EX PADS
6.0000 | MEDICATED_PAD | Freq: Every day | CUTANEOUS | Status: DC
  Administered 2021-03-14 – 2021-03-16 (×3): 6 via TOPICAL

## 2021-03-13 MED ORDER — PROPOFOL 1000 MG/100ML IV EMUL
5.0000 ug/kg/min | INTRAVENOUS | Status: DC
Start: 2021-03-13 — End: 2021-03-13
  Administered 2021-03-13: 15 ug/kg/min via INTRAVENOUS
  Filled 2021-03-13: qty 100

## 2021-03-13 MED ORDER — MIDAZOLAM HCL 2 MG/2ML IJ SOLN
10.0000 mg | Freq: Once | INTRAMUSCULAR | Status: AC
  Filled 2021-03-13: qty 10

## 2021-03-13 MED ORDER — NOREPINEPHRINE 4 MG/250ML-% IV SOLN
2.0000 ug/min | INTRAVENOUS | Status: DC
  Administered 2021-03-14: 12 ug/min via INTRAVENOUS
  Filled 2021-03-13: qty 250

## 2021-03-13 MED ORDER — CHLORHEXIDINE GLUCONATE 0.12% ORAL RINSE (MEDLINE KIT)
15.0000 mL | Freq: Two times a day (BID) | OROMUCOSAL | Status: DC
  Administered 2021-03-13 – 2021-03-16 (×6): 15 mL via OROMUCOSAL

## 2021-03-13 MED ORDER — SODIUM CHLORIDE 0.9 % IV SOLN
200.0000 mg | Freq: Once | INTRAVENOUS | Status: AC
  Administered 2021-03-13: 200 mg via INTRAVENOUS
  Filled 2021-03-13: qty 20

## 2021-03-13 MED ORDER — FENTANYL CITRATE (PF) 100 MCG/2ML IJ SOLN: 25.0000 ug | INTRAMUSCULAR | Status: DC | PRN

## 2021-03-13 MED ORDER — PROPOFOL 10 MG/ML IV BOLUS: 0.5000 mg/kg | Freq: Once | INTRAVENOUS | Status: DC

## 2021-03-13 MED ORDER — POLYETHYLENE GLYCOL 3350 17 G PO PACK: 17.0000 g | PACK | Freq: Every day | ORAL | Status: DC | PRN

## 2021-03-13 MED ORDER — MIDAZOLAM HCL 2 MG/2ML IJ SOLN: 1.0000 mg | INTRAMUSCULAR | Status: DC | PRN

## 2021-03-13 MED ORDER — LEVETIRACETAM 100 MG/ML PO SOLN
1000.0000 mg | Freq: Two times a day (BID) | ORAL | Status: DC
  Filled 2021-03-13: qty 10

## 2021-03-13 MED ORDER — FENTANYL 2500MCG IN NS 250ML (10MCG/ML) PREMIX INFUSION
50.0000 ug/h | INTRAVENOUS | Status: DC
  Administered 2021-03-13: 100 ug/h via INTRAVENOUS
  Filled 2021-03-13: qty 250

## 2021-03-13 MED ORDER — SODIUM CHLORIDE 0.9 % IV SOLN
2.0000 g | INTRAVENOUS | Status: DC
  Administered 2021-03-14 – 2021-03-15 (×2): 2 g via INTRAVENOUS
  Filled 2021-03-13 (×2): qty 20

## 2021-03-13 MED ORDER — SODIUM CHLORIDE 0.9 % IV SOLN
1.0000 g | Freq: Once | INTRAVENOUS | Status: AC
  Administered 2021-03-13: 1 g via INTRAVENOUS
  Filled 2021-03-13: qty 10

## 2021-03-13 MED ORDER — LORAZEPAM 2 MG/ML IJ SOLN: 1.0000 mg | Freq: Once | INTRAMUSCULAR | Status: AC

## 2021-03-13 MED ORDER — PANTOPRAZOLE SODIUM 40 MG PO PACK
40.0000 mg | PACK | Freq: Every day | ORAL | Status: DC
  Administered 2021-03-14 – 2021-03-16 (×3): 40 mg
  Filled 2021-03-13 (×4): qty 20

## 2021-03-13 MED ORDER — DOCUSATE SODIUM 50 MG/5ML PO LIQD: 100.0000 mg | Freq: Two times a day (BID) | ORAL | Status: DC

## 2021-03-13 MED ORDER — FENTANYL 2500MCG IN NS 250ML (10MCG/ML) PREMIX INFUSION: 0.0000 ug/h | INTRAVENOUS | Status: DC

## 2021-03-13 MED ORDER — SODIUM CHLORIDE 0.9% FLUSH
10.0000 mL | Freq: Two times a day (BID) | INTRAVENOUS | Status: DC
  Administered 2021-03-14 – 2021-03-16 (×6): 10 mL

## 2021-03-13 MED ORDER — ATORVASTATIN CALCIUM 40 MG PO TABS
40.0000 mg | ORAL_TABLET | Freq: Every day | ORAL | Status: DC
  Administered 2021-03-14 – 2021-03-15 (×2): 40 mg via NASOGASTRIC
  Filled 2021-03-13 (×2): qty 1

## 2021-03-13 MED ORDER — MIDAZOLAM HCL 2 MG/2ML IJ SOLN
INTRAMUSCULAR | Status: AC
  Administered 2021-03-13: 10 mg via INTRAVENOUS
  Filled 2021-03-13: qty 10

## 2021-03-13 MED ORDER — DOCUSATE SODIUM 50 MG/5ML PO LIQD
100.0000 mg | Freq: Two times a day (BID) | ORAL | Status: DC | PRN
  Filled 2021-03-13: qty 10

## 2021-03-13 MED ORDER — LORAZEPAM 2 MG/ML IJ SOLN
INTRAMUSCULAR | Status: AC
  Administered 2021-03-13: 1 mg via INTRAVENOUS
  Filled 2021-03-13: qty 1

## 2021-03-13 MED ORDER — SODIUM CHLORIDE 0.9% FLUSH
10.0000 mL | INTRAVENOUS | Status: DC | PRN
Start: 2021-03-13 — End: 2021-03-17
  Administered 2021-03-15 – 2021-03-16 (×3): 10 mL

## 2021-03-13 MED ORDER — SODIUM CHLORIDE 0.9 % IV SOLN
250.0000 mL | INTRAVENOUS | Status: DC
  Administered 2021-03-14: 250 mL via INTRAVENOUS
  Administered 2021-03-15: 500 mL via INTRAVENOUS

## 2021-03-13 MED ORDER — LACTATED RINGERS IV BOLUS
1000.0000 mL | Freq: Once | INTRAVENOUS | Status: AC
  Administered 2021-03-13: 1000 mL via INTRAVENOUS

## 2021-03-13 MED ORDER — LEVETIRACETAM IN NACL 1000 MG/100ML IV SOLN
1000.0000 mg | Freq: Two times a day (BID) | INTRAVENOUS | Status: DC
  Administered 2021-03-13 – 2021-03-16 (×6): 1000 mg via INTRAVENOUS
  Filled 2021-03-13 (×9): qty 100

## 2021-03-13 MED ORDER — SUCCINYLCHOLINE CHLORIDE 20 MG/ML IJ SOLN
100.0000 mg | Freq: Once | INTRAMUSCULAR | Status: AC
  Administered 2021-03-13: 100 mg via INTRAVENOUS

## 2021-03-13 MED ORDER — SODIUM CHLORIDE 0.9 % IV SOLN
1000.0000 mg | Freq: Once | INTRAVENOUS | Status: AC
  Administered 2021-03-14: 1000 mg via INTRAVENOUS
  Filled 2021-03-13: qty 20

## 2021-03-13 NOTE — ED Provider Notes (Signed)
[facility]ERGENCY DEPARTMENT Provider Note   CSN: [id number] Arrival date & time: 03/05/2021  1744     History   [name] is a 75 y.o. female presenting as transfer from [facility] ED with concern for altered mental status, urosepsis, and status epilepticus.  The patient was intubated at the other emergency department with concern for airway control.  Neurology was consulted in the emergency department and recommended transfer to our center for EEG monitoring with concern for status epilepticus.  The patient arrives intubated and unable to communicate.  Recommendations from Dr Rory Percy neurologist consultant as below:  -She requires emergent intubation for airway protection, which is being performed by the EDP at this time. -She will need transfer to [facility] for continuous EEG. -She has received 1 mg of Ativan followed by a load of Keppra IV in the emergency room. -She is on Keppra, phenytoin, Lamictal and Vimpat for her antiepileptics at home. She has allergy to valproate and Trileptal on her chart. In addition to the Keppra and Ativan, I will give her l Vimpat 200 mg IV x1. -Once she has an OG tube, she should also continue to get her home dose of Lamictal. -Check phenytoin level-if normal, resume home dose otherwise load with phenytoin-can ask pharmacy consultation on the loading dose based on the level. -Check CTH today after intubation -Has clear evidence of UTI on labs yesterday, would not chase a CNS infectious pathology unless over the course of next day or 2 she does not show any improvement with treatment of her status epilepticus and UTI. -Do not see a need for spinal tap or meningitic coverage at this time. -Blood cultures, urine culture, respiratory cultures.     Plan discussed in detail with Dr. Vladimir Crofts and the ER care team at the patient's bedside. Will notify the neuro hospitalist over at [facility].  Would recommend an ER to ER  transfer due to lack of beds in the ICU at this time-this will expedite patient's EEG as well as continuing neurological care 24/7 at the tertiary care center.    HPI     Past Medical History:  Diagnosis Date   Asthma    CHF (congestive heart failure) ([place])    Diabetes mellitus without complication ([place])    Hepatitis A    Hepatitis B    Hepatitis C    Hypertension    Respiratory failure ([place])    Schizophrenia ([place])    Seizures ([place])    Smoker    1 pack daily    Patient Active Problem List   Diagnosis Date Noted   Seizure ([place]) 03/03/2021   Acute delirium 09/26/2019   Abnormal urinalysis 09/26/2019   Person under investigation for COVID-19 09/26/2019   Asthma    Chronic diastolic heart failure ([place])    Schizophrenia ([place])    Hyperlipidemia 11/03/2016   Left-sided weakness 11/03/2016   Dementia ([place]) 11/03/2016   MRSA carrier 11/03/2016   TIA (transient ischemic attack) 11/02/2016   Bronchiectasis without acute exacerbation ([place]) 10/17/2013   Seizures ([place]) 09/19/2013   Encounter for long-term (current) use of other medications 09/19/2013   Hypercalcemia 09/19/2013   Atrial fibrillation ([place]) 08/14/2013   Congestive heart failure, unspecified 08/14/2013   Hypopotassemia 08/14/2013   Seizure disorder ([place]) 07/14/2013   Schizophrenia, chronic condition ([place]) 07/14/2013   Hypertension 07/14/2013   Septic shock ([place]) 07/14/2013   UTI (urinary tract infection) 07/14/2013   Altered mental status 04/01/2013  Anemia 03/31/2013   Status epilepticus ([place]) 03/28/2013   Acute respiratory failure ([place]) 03/28/2013   Diabetes mellitus without complication ([place]) 67/67/2094    No past surgical history on file.   OB History   No obstetric history on file.     No family history on file.  Social History   Tobacco Use   Smoking status: Every Day    Packs/day: 1.00    Years: 50.00    Pack years: 50.00    Types: Cigarettes   Smokeless tobacco: Never    Home  Medications Prior to Admission medications   Medication Sig Start Date End Date Taking? Authorizing Provider  acetaminophen (TYLENOL) 500 MG tablet Take 500 mg by mouth every 6 (six) hours as needed for pain.    [provider]  aspirin EC 81 MG tablet Take 81 mg by mouth daily.    [provider]  atorvastatin (LIPITOR) 40 MG tablet Take 1 tablet (40 mg total) by mouth daily at 6 PM. 11/03/16   Theodoro Grist, MD  B Complex-C (B-COMPLEX WITH VITAMIN C) tablet Take 1 tablet by mouth daily.    [provider]  benztropine (COGENTIN) 0.5 MG tablet Take 0.5 mg by mouth 2 (two) times daily.    [provider]  calcium carbonate (TUMS - DOSED IN MG ELEMENTAL CALCIUM) 500 MG chewable tablet Chew 1 tablet by mouth 2 (two) times daily.    [provider]  cephALEXin (KEFLEX) 500 MG capsule Take 1 capsule (500 mg total) by mouth 2 (two) times daily. Patient not taking: Reported on 03/14/2021 03/12/21   Harvest Dark, MD  Chloroxylenol-Zinc Oxide [facility] EX) Apply topically See admin instructions. Apply barrier cream topically to right buttock lesion after each clean Patient not taking: Reported on 03/22/2021    [provider]  cyclobenzaprine (FLEXERIL) 5 MG tablet Take 5 mg by mouth 2 (two) times daily as needed for muscle spasms. Patient not taking: No sig reported    [provider]  famotidine (PEPCID) 20 MG tablet Take 20 mg by mouth at bedtime.    [provider]  fluPHENAZine (PROLIXIN) 5 MG tablet Take 5 mg by mouth 2 (two) times daily. At 1400 and 2000    [provider]  fluPHENAZine decanoate (PROLIXIN) 25 MG/ML injection Inject into the muscle every 14 (fourteen) days. Patient not taking: No sig reported    [provider]  lacosamide (VIMPAT) 200 MG TABS tablet Take one tablet by mouth twice daily 08/15/13   Lauree Chandler, NP  lamoTRIgine (LAMICTAL) 200 MG tablet Take 200 mg by mouth 2 (two) times  daily.     [provider]  levETIRAcetam (KEPPRA) 1000 MG tablet Take 1 tablet (1,000 mg total) by mouth 2 (two) times daily. 07/14/13   Street, Sharon Mt, MD  metoprolol (LOPRESSOR) 50 MG tablet Take 75 mg by mouth 2 (two) times daily. Do not administer if pulse <50    [provider]  naproxen (NAPROSYN) 500 MG tablet Take 500 mg by mouth 2 (two) times daily. Patient not taking: No sig reported 02/19/21   [provider]  oxybutynin (DITROPAN) 5 MG tablet Take 5 mg by mouth 2 (two) times daily.    [provider]  phenytoin (DILANTIN) 100 MG ER capsule Take 100 mg by mouth 2 (two) times daily.     [provider]  polyethylene glycol (MIRALAX / GLYCOLAX) 17 g packet Take 17 g by mouth daily.  [provider]  thiamine (VITAMIN B-1) 100 MG tablet Take 100 mg by mouth daily.    [provider]    Allergies    Depakote [valproic acid], Haldol [haloperidol lactate], Penicillins, Shellfish allergy, and Trileptal [oxcarbazepine]  Review of Systems   Review of Systems  Unable to perform ROS: Intubated (level 5 caveat)   Physical Exam Updated Vital Signs BP 111/68   Pulse 88   Temp (!) 100.5 F (38.1 C)   Resp 16   SpO2 100%   Physical Exam Constitutional:      Comments: intubated  Cardiovascular:     Rate and Rhythm: Normal rate.     Pulses: Normal pulses.  Pulmonary:     Effort: Pulmonary effort is normal.     Breath sounds: Normal breath sounds.     Comments: Equal BS< no hypoxia Skin:    General: Skin is warm and dry.    ED Results / Procedures / Treatments   Labs (all labs ordered are listed, but only abnormal results are displayed) Labs Reviewed  PHENYTOIN LEVEL, TOTAL - Abnormal; Notable for the following components:      Result Value   Phenytoin Lvl <2.5 (*)    All other components within normal limits  LACTIC ACID, PLASMA - Abnormal; Notable for the following components:   Lactic Acid, Venous 2.7  (*)    All other components within normal limits  CK TOTAL AND CKMB (NOT AT [facility]) - Abnormal; Notable for the following components:   Total CK 543 (*)    CK, MB 7.8 (*)    All other components within normal limits  HEMOGLOBIN A1C - Abnormal; Notable for the following components:   Hgb A1c MFr Bld 6.6 (*)    All other components within normal limits  GLUCOSE, CAPILLARY - Abnormal; Notable for the following components:   Glucose-Capillary 104 (*)    All other components within normal limits  MRSA NEXT GEN BY PCR, NASAL  AMMONIA  CBC  BASIC METABOLIC PANEL  MAGNESIUM  TRIGLYCERIDES    EKG None  Radiology CT Head Wo Contrast  Result Date: 03/10/2021 CLINICAL DATA:  Mental status changes EXAM: CT HEAD WITHOUT CONTRAST TECHNIQUE: Contiguous axial images were obtained from the base of the skull through the vertex without intravenous contrast. COMPARISON:  03/12/2021 FINDINGS: Brain: There is atrophy and chronic small vessel disease changes. Old right basal ganglia lacunar infarct. No acute intracranial abnormality. Specifically, no hemorrhage, hydrocephalus, mass lesion, acute infarction, or significant intracranial injury. Vascular: No hyperdense vessel or unexpected calcification. Skull: No acute calvarial abnormality. Sinuses/Orbits: Air-fluid level in the right maxillary sinus. Other: None IMPRESSION: No significant change since prior study.  No acute findings. Electronically Signed   By: Rolm Baptise M.D.   On: 03/20/2021 16:06   CT Head Wo Contrast  Result Date: 03/12/2021 CLINICAL DATA:  Seizure EXAM: CT HEAD WITHOUT CONTRAST TECHNIQUE: Contiguous axial images were obtained from the base of the skull through the vertex without intravenous contrast. COMPARISON:  12/25/2020 FINDINGS: Brain: There is atrophy and chronic small vessel disease changes. 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 No acute intracranial abnormality. Specifically, no hemorrhage, hydrocephalus, mass lesion, acute  infarction, or significant intracranial injury. Vascular: No hyperdense vessel or unexpected calcification. Skull: No acute calvarial abnormality. Sinuses/Orbits: Visualized paranasal sinuses and mastoids clear. Orbital soft tissues unremarkable. 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 Atrophy, chronic microvascular disease. No acute intracranial abnormality. Electronically Signed   By: Rolm Baptise M.D.  On: 03/12/2021 15:23   DG Chest Port 1 View  Result Date: 03/21/2021 CLINICAL DATA:  Acute respiratory failure with hypoxia EXAM: PORTABLE CHEST 1 VIEW COMPARISON:  02/28/2021 at 1610 hours FINDINGS: Minimal bibasilar atelectasis.  No pleural effusion or pneumothorax. The heart is normal in size. Endotracheal tube terminates 7 mm above the carina. Enteric tube courses into the proximal stomach with its side port at the GE junction. IMPRESSION: Minimal bibasilar atelectasis. Support apparatus as above. Electronically Signed   By: Julian Hy M.D.   On: 03/08/2021 19:20   DG Chest Portable 1 View  Result Date: 03/04/2021 CLINICAL DATA:  Post intubation, OG tube placement EXAM: PORTABLE CHEST 1 VIEW COMPARISON:  03/07/2021 FINDINGS: Endotracheal tube is 1.2 cm above the carina. NG tube is in the stomach. Minimal right base atelectasis. Left lung clear. Heart is normal size. No effusions or acute bony abnormality. IMPRESSION: Minimal right base atelectasis. Electronically Signed   By: Rolm Baptise M.D.   On: 03/10/2021 16:18   DG Chest Port 1 View  Result Date: 03/27/2021 CLINICAL DATA:  Sepsis.  Altered mental status.  Fever. EXAM: PORTABLE CHEST 1 VIEW COMPARISON:  09/26/2020 FINDINGS: The patient is rotated to the right on today's radiograph, reducing diagnostic sensitivity and specificity. Atherosclerotic calcification of the aortic arch. Heart size within normal limits. No blunting of the costophrenic angles. Mild degenerative glenohumeral arthropathy on the left.  Linear subsegmental atelectasis or scarring at the right lung base. IMPRESSION: 1. Subsegmental atelectasis at the right lung base; no compelling findings for active pneumonia. 2.  Aortic Atherosclerosis (ICD10-I70.0). Electronically Signed   By: Van Clines M.D.   On: 03/03/2021 13:42   CT Renal Stone Study  Result Date: 03/02/2021 CLINICAL DATA:  Sepsis, urinary tract infections EXAM: CT ABDOMEN AND PELVIS WITHOUT CONTRAST TECHNIQUE: Multidetector CT imaging of the abdomen and pelvis was performed following the standard protocol without IV contrast. COMPARISON:  02/14/2013 FINDINGS: Lower chest: Mild scarring or atelectasis in the right lower lobe. Circumflex and right coronary artery and descending thoracic aortic atherosclerotic calcification. Hepatobiliary: Unremarkable Pancreas: Unremarkable Spleen: Unremarkable Adrenals/Urinary Tract: Low-density fullness of both adrenal glands without mass identified. Vascular calcification along the left renal hilum, image 74 series 5. Punctate 1-2 mm right kidney upper pole calcification on image 84 series 5, probably a renal calculus. Borderline right hydronephrosis without ureteral calculus or specific cause identified. Stomach/Bowel: Descending and sigmoid colon diverticulosis without findings of active diverticulitis. Circumferential wall thickening in the lower rectum extending to the anorectal junction for example on image 77 series 2, cannot exclude inflammation or tumor, correlate with digital rectal exam findings. Vascular/Lymphatic: Aortoiliac atherosclerotic vascular disease. Reproductive: Posterior uterine body fibroid. Other: Cutaneous and subcutaneous edema posterolateral to the right hip on image 87 series 2. Musculoskeletal: Severe bilateral hip arthropathy with protrusio. Lumbar spondylosis and degenerative disc disease causing multilevel impingement. Mild grade 1 degenerative retrolisthesis at L2-3. Small indirect right inguinal hernia contains  adipose tissue. IMPRESSION: 1. There borderline right hydronephrosis without ureteral calculus or visible cause. 2. Punctate 1-2 mm right kidney upper pole nonobstructive renal calculus. 3. Wall thickening in the lower rectum and anorectal junction, inflammation or tumor not excluded. 4.  Aortic Atherosclerosis (ICD10-I70.0).  Coronary atherosclerosis. 5. Descending and sigmoid colon diverticulosis. 6. Uterine fibroid. 7. Cutaneous and subcutaneous edema overlying the right hip, cause uncertain. 8. Severe bilateral hip arthropathy with protrusio. 9. Multilevel lumbar impingement. Electronically Signed   By: Van Clines M.D.   On: 03/29/2021 14:25  Procedures Procedures   Medications Ordered in ED Medications  docusate (COLACE) 50 MG/5ML liquid 100 mg (has no administration in time range)  polyethylene glycol (MIRALAX / GLYCOLAX) packet 17 g (has no administration in time range)  heparin injection 5,000 Units (has no administration in time range)  pantoprazole sodium (PROTONIX) 40 mg/20 mL oral suspension 40 mg (has no administration in time range)  fentaNYL (SUBLIMAZE) injection 25 mcg (has no administration in time range)  fentaNYL (SUBLIMAZE) injection 25-100 mcg (has no administration in time range)  propofol (DIPRIVAN) 1000 MG/100ML infusion (15 mcg/kg/min  52.2 kg Intravenous New Bag/Given 03/21/2021 1944)  midazolam (VERSED) injection 1 mg (has no administration in time range)  cefTRIAXone (ROCEPHIN) 2 g in sodium chloride 0.9 % 100 mL IVPB (has no administration in time range)  lacosamide (VIMPAT) 200 mg in sodium chloride 0.9 % 25 mL IVPB (has no administration in time range)  lamoTRIgine (LAMICTAL) tablet 200 mg (has no administration in time range)  aspirin chewable tablet 81 mg (has no administration in time range)  atorvastatin (LIPITOR) tablet 40 mg (has no administration in time range)  fluPHENAZine (PROLIXIN) tablet 5 mg (has no administration in time range)  benztropine  (COGENTIN) tablet 0.5 mg (has no administration in time range)  insulin aspart (novoLOG) injection 0-9 Units (has no administration in time range)  chlorhexidine gluconate (MEDLINE KIT) (PERIDEX) 0.12 % solution 15 mL (has no administration in time range)  MEDLINE mouth rinse (has no administration in time range)  Chlorhexidine Gluconate Cloth 2 % PADS 6 each (has no administration in time range)  levETIRAcetam (KEPPRA) IVPB 1000 mg/100 mL premix (1,000 mg Intravenous New Bag/Given 03/15/2021 2256)  0.9 %  sodium chloride infusion (has no administration in time range)  norepinephrine (LEVOPHED) 56m in 2558mpremix infusion (5 mcg/min Intravenous New Bag/Given 03/02/2021 2300)  lactated ringers bolus 1,000 mL (0 mLs Intravenous Stopped 03/27/2021 1946)  midazolam (VERSED) injection 10 mg (10 mg Intravenous Given 03/10/2021 2237)    ED Course  I have reviewed the triage vital signs and the nursing notes.  Pertinent labs & imaging results that were available during my care of the patient were reviewed by me and considered in my medical decision making (see chart for details).  Patient arrives intubated, sedated, the setting of suspected status epilepticus.  She received IV antiseizure medications at the prior facility.  Neurology was consulted there and the recommendations are as noted above.  Her CT scan was reviewed from prior facility which did not show any signs of an acute brain hemorrhage.  She has UTI which may be an inciting event.  She did receive IV antibiotics.  On arrival her blood pressure and vital signs are stable.  She does not show any signs of shock at this time.  Neurology will be consulted.  As well as critical care team.  Clinical Course as of 03/25/2021 2306  Fri Mar 13, 2021  1812 [facility] int[facility] and sedated on propofol and fentanyl,, vital signs normal on arrival.  I have paged her neurologist [MT]  1817 I spoke to Dr BhCurly [facility]rom neurology - she will  order EEG.  Critical care team paged [MT]  1841 [facility] - they will come evaluate patient for admission. [MT]    Clinical Course User Index [MT] Vonnie Spagnolo, MaCarola RhineMD    Final Clinical Impression(s) / ED Diagnoses Final diagnoses:  [place] with [place]  Rx / DC Orders ED Discharge Orders     None        Mahari Vankirk, Carola Rhine, MD 03/14/2021 2306

## 2021-03-13 NOTE — H&P (Deleted)
NAME:  [name], MRN:  [id number], DOB:  December 04, 1945, LOS: 0 ADMISSION DATE:  24-Mar-2021, CONSULTATION DATE: 12/12/2020 REFERRING MD: [facility], CHIEF COMPLAINT: Seizure  History of Present Illness:  Patient transferred to [facility] emergency department for a seizure, on 4 antiepileptic meds with fever and UTI along with worsening mental status  Pertinent  Medical History  Patient is a 75 year old female with a history of asthma, CHF, diabetes, hypertension, schizophrenia, history of hepatitis A, B, and C, and seizure disorder routinely on 4 different antiepileptics who had a witnessed seizure at her care facility 03/12/2021.  She was initially treated with Versed.  Urinalysis was consistent with urinary tract infection urine culture is pending.  Patient was properly treated with Rocephin.  Fever was high as 101.5 mental status remained depressed to the emergency room with recurrent episodes of right gaze deviation and right neck and arm twitching.  Due to persistent seizure activity and altered mental status she was intubated and transferred to [facility], ICU.  She currently is being treated with IV Keppra Lamictal in Vimpat.  It should be noted the patient has an allergy to valproic acid and Trileptal.  Patient is being sedated with propofol.  Neurology has seen the patient is managing her antiepileptics.  On my arrival neurology at bedside, patient with subclinical seizure activity by EEG. Last blood gas obtained at 3:00 this afternoon showed pH 7.44 PCO2 32 PO2 of 111 and a bicarb of 21.  Laboratories are essentially unremarkable other than a glucose of 166 anion gap of 15, CRP of 63.7 and a lactic acid of 3.5.  These labs are all taken this afternoon.  CT scan of the brain showed old lacunar infarct without acute changes.  EKG showed no acute changes.  Significant Hospital Events: Including procedures, antibiotic start and stop dates in addition to other pertinent  events   Intubation 03-24-21  Interim History / Subjective:  Patient has been intubated since presenting to [facility].  Objective   Blood pressure 111/68, pulse 88, temperature (!) 100.5 F (38.1 C), resp. rate 16, SpO2 100 %.    Vent Mode: PRVC FiO2 (%):  [32 %-100 %] 40 % Set Rate:  [16 bmp] 16 bmp Vt Set:  [400 mL-490 mL] 490 mL PEEP:  [5 cmH20] 5 cmH20 Plateau Pressure:  [16 cmH20] 16 cmH20  No intake or output data in the 24 hours ending 03-24-21 2153 There were no vitals filed for this visit.  Examination: General: elderly BF sedated, ventilated HENT: WNL Lungs: Clear Cardiovascular: reg rate and rhythm Abdomen: Benign Extremities: WNL Neuro: sedated, spastic eye movement GU: NA  Resolved Hospital Problem list   NA  Assessment & Plan:  1.  Status epilepticus with a history of seizure disorder: Currently on Keppra Lamictal and valproic acid.  These drugs will be managed per neurology. 2.  Urinary tract infection: We will continue Rocephin. 3.  Hyperglycemia: We will monitor 4.  Anemia: Monitor not at transfusion level  Best Practice (right click and "Reselect all SmartList Selections" daily)   Diet/type: tubefeeds DVT prophylaxis: prophylactic heparin  GI prophylaxis: H2B Lines: N/A Foley:  N/A Code Status:  full code Last date of multidisciplinary goals of care discussion [N/A]  Labs   CBC: Recent Labs  Lab 03/12/21 1118 03-24-2021 1306  WBC 5.9 13.7*  NEUTROABS  --  8.5*  HGB 14.1 16.1*  HCT 42.4 47.1*  MCV 98.1 96.7  PLT 209 197    Basic  Metabolic Panel: Recent Labs  Lab 03/12/21 1118 03/29/2021 1306  NA 138 139  K 4.3 3.9  CL 100 100  CO2 26 24  GLUCOSE 141* 166*  BUN 10 34*  CREATININE 0.47 1.29*  CALCIUM 9.6 9.6   GFR: Estimated Creatinine Clearance: 32 mL/min (A) (by C-G formula based on SCr of 1.29 mg/dL (H)). Recent Labs  Lab 03/12/21 1118 03/10/2021 1306 03/25/2021 1513  WBC 5.9 13.7*  --   LATICACIDVEN  --  1.8 3.5*    Liver  Function Tests: Recent Labs  Lab 03/12/21 1118 03/12/2021 1306  AST 37 43*  ALT 26 27  ALKPHOS 101 105  BILITOT 0.9 1.0  PROT 7.4 7.9  ALBUMIN 4.1 4.3   No results for input(s): LIPASE, AMYLASE in the last 168 hours. No results for input(s): AMMONIA in the last 168 hours.  ABG    Component Value Date/Time   PHART 7.44 03/08/2021 1534   PCO2ART 32 03/05/2021 1534   PO2ART 111 (H) 03/27/2021 1534   HCO3 21.7 03/22/2021 1534   TCO2 21 07/11/2013 1157   ACIDBASEDEF 1.5 03/15/2021 1534   O2SAT 98.5 03/15/2021 1534     Coagulation Profile: Recent Labs  Lab 03/09/2021 1306  INR 1.0    Cardiac Enzymes: Recent Labs  Lab 03/18/2021 1926  CKTOTAL 543*  CKMB 7.8*    HbA1C: Hemoglobin A1C  Date/Time Value Ref Range Status  02/14/2013 01:40 AM 6.7 (H) 4.2 - 6.3 % Final    Comment:    The American Diabetes Association recommends that a primary goal of therapy should be <7% and that physicians should reevaluate the treatment regimen in patients with HbA1c values consistently >8%.    Hgb A1c MFr Bld  Date/Time Value Ref Range Status  03/12/2021 07:32 PM 6.6 (H) 4.8 - 5.6 % Final    Comment:    (NOTE) Pre diabetes:          5.7%-6.4%  Diabetes:              >6.4%  Glycemic control for   <7.0% adults with diabetes   09/26/2019 12:11 PM 6.6 (H) 4.8 - 5.6 % Final    Comment:    (NOTE) Pre diabetes:          5.7%-6.4% Diabetes:              >6.4% Glycemic control for   <7.0% adults with diabetes     CBG: Recent Labs  Lab 03/12/21 1113  GLUCAP 145*    Review of Systems:   Unable to obtain due to sedation and intubation  Past Medical History:  She,  has a past medical history of Asthma, CHF (congestive heart failure) (HCC), Diabetes mellitus without complication (HCC), Hepatitis A, Hepatitis B, Hepatitis C, Hypertension, Respiratory failure (HCC), Schizophrenia (HCC), Seizures (HCC), and Smoker.   Surgical History:  No past surgical history on file.    Social History:   reports that she has been smoking cigarettes. She has a 50.00 pack-year smoking history. She has never used smokeless tobacco.   Family History:  Her family history is not on file.   Allergies Allergies  Allergen Reactions   Depakote [Valproic Acid] Other (See Comments)    unknown   Haldol [Haloperidol Lactate] Other (See Comments)    Unknown    Penicillins Other (See Comments)    Unknown    Shellfish Allergy Other (See Comments)    unknown   Trileptal [Oxcarbazepine] Other (See Comments)    unknown  Home Medications  Prior to Admission medications   Medication Sig Start Date End Date Taking? Authorizing Provider  acetaminophen (TYLENOL) 500 MG tablet Take 500 mg by mouth every 6 (six) hours as needed for pain.    [provider]  aspirin EC 81 MG tablet Take 81 mg by mouth daily.    [provider]  atorvastatin (LIPITOR) 40 MG tablet Take 1 tablet (40 mg total) by mouth daily at 6 PM. 11/03/16   Katharina Caper, MD  B Complex-C (B-COMPLEX WITH VITAMIN C) tablet Take 1 tablet by mouth daily.    [provider]  benztropine (COGENTIN) 0.5 MG tablet Take 0.5 mg by mouth 2 (two) times daily.    [provider]  calcium carbonate (TUMS - DOSED IN MG ELEMENTAL CALCIUM) 500 MG chewable tablet Chew 1 tablet by mouth 2 (two) times daily.    [provider]  cephALEXin (KEFLEX) 500 MG capsule Take 1 capsule (500 mg total) by mouth 2 (two) times daily. Patient not taking: Reported on 04/11/21 03/12/21   Minna Antis, MD  Chloroxylenol-Zinc Oxide [facility] EX) Apply topically See admin instructions. Apply barrier cream topically to right buttock lesion after each clean Patient not taking: Reported on April 11, 2021    [provider]  cyclobenzaprine (FLEXERIL) 5 MG tablet Take 5 mg by mouth 2 (two) times daily as needed for muscle spasms. Patient not taking: No sig reported    [provider]  famotidine  (PEPCID) 20 MG tablet Take 20 mg by mouth at bedtime.    [provider]  fluPHENAZine (PROLIXIN) 5 MG tablet Take 5 mg by mouth 2 (two) times daily. At 1400 and 2000    [provider]  fluPHENAZine decanoate (PROLIXIN) 25 MG/ML injection Inject into the muscle every 14 (fourteen) days. Patient not taking: No sig reported    [provider]  lacosamide (VIMPAT) 200 MG TABS tablet Take one tablet by mouth twice daily 08/15/13   Sharon Seller, NP  lamoTRIgine (LAMICTAL) 200 MG tablet Take 200 mg by mouth 2 (two) times daily.     [provider]  levETIRAcetam (KEPPRA) 1000 MG tablet Take 1 tablet (1,000 mg total) by mouth 2 (two) times daily. 07/14/13   Street, Stephanie Coup, MD  metoprolol (LOPRESSOR) 50 MG tablet Take 75 mg by mouth 2 (two) times daily. Do not administer if pulse <50    [provider]  naproxen (NAPROSYN) 500 MG tablet Take 500 mg by mouth 2 (two) times daily. Patient not taking: No sig reported 02/19/21   [provider]  oxybutynin (DITROPAN) 5 MG tablet Take 5 mg by mouth 2 (two) times daily.    [provider]  phenytoin (DILANTIN) 100 MG ER capsule Take 100 mg by mouth 2 (two) times daily.     [provider]  polyethylene glycol (MIRALAX / GLYCOLAX) 17 g packet Take 17 g by mouth daily.    [provider]  thiamine (VITAMIN B-1) 100 MG tablet Take 100 mg by mouth daily.    [provider]     Critical care time: 45 minutes

## 2021-03-13 NOTE — ED Notes (Signed)
EMTALA reviewed by this RN.  

## 2021-03-13 NOTE — Progress Notes (Signed)
STAT LTM started; no initial skin breakdown was seen; educated nurse on how to move EEG equipment to the floor.

## 2021-03-13 NOTE — ED Provider Notes (Signed)
[facility] Emergency Department Provider Note ____________________________________________   Event Date/Time   First MD Initiated Contact with Patient 06-Apr-2021 1255     (approximate)  I have reviewed the triage vital signs and the nursing notes.  HISTORY  Chief Complaint Urinary Tract Infection   HPI [name] is a 75 y.o. femalewho presents to the ED for evaluation of possible sepsis.   Chart review indicates history of asthma, CHF, diabetes, HTN, schizophrenia and seizure disorder on 4 different antiepileptics.  She was seen yesterday, just about 24 hours ago, for a seizure.  This was thought to be precipitated by UTI, so she was discharged back to her SNF with antibiotics, Keflex.  She returns today to the ED for evaluation of fever, decreased mental status and concern for sepsis.  She reportedly has been unable to swallow her medications, including her new antibiotic.  She reportedly has had decreased mental status and is no longer talkative as she typically is.  No reports of seizure.   History limited due to patient's altered mentation and acute illness.  Past Medical History:  Diagnosis Date   Asthma    CHF (congestive heart failure) (HCC)    Diabetes mellitus without complication (HCC)    Hepatitis A    Hepatitis B    Hepatitis C    Hypertension    Respiratory failure (HCC)    Schizophrenia (HCC)    Seizures (HCC)    Smoker    1 pack daily    Patient Active Problem List   Diagnosis Date Noted   Acute delirium 09/26/2019   Abnormal urinalysis 09/26/2019   Person under investigation for COVID-19 09/26/2019   Asthma    Chronic diastolic heart failure (HCC)    Schizophrenia (HCC)    Hyperlipidemia 11/03/2016   Left-sided weakness 11/03/2016   Dementia (HCC) 11/03/2016   MRSA carrier 11/03/2016   TIA (transient ischemic attack) 11/02/2016   Bronchiectasis without acute exacerbation (HCC) 10/17/2013   Seizures (HCC) 09/19/2013    Encounter for long-term (current) use of other medications 09/19/2013   Hypercalcemia 09/19/2013   Atrial fibrillation (HCC) 08/14/2013   Congestive heart failure, unspecified 08/14/2013   Hypopotassemia 08/14/2013   Seizure disorder (HCC) 07/14/2013   Schizophrenia, chronic condition (HCC) 07/14/2013   Hypertension 07/14/2013   Sepsis (HCC) 07/14/2013   UTI (urinary tract infection) 07/14/2013   Altered mental status 04/01/2013   Anemia 03/31/2013   Status epilepticus (HCC) 03/28/2013   Acute respiratory failure (HCC) 03/28/2013   Diabetes mellitus without complication (HCC) 03/28/2013    History reviewed. No pertinent surgical history.  Prior to Admission medications   Medication Sig Start Date End Date Taking? Authorizing Provider  acetaminophen (TYLENOL) 500 MG tablet Take 500 mg by mouth every 6 (six) hours as needed for pain.    [provider]  aspirin EC 81 MG tablet Take 81 mg by mouth daily.    [provider]  atorvastatin (LIPITOR) 40 MG tablet Take 1 tablet (40 mg total) by mouth daily at 6 PM. Patient taking differently: Take 40 mg by mouth every evening. 11/03/16   Katharina Caper, MD  B Complex-C (B-COMPLEX WITH VITAMIN C) tablet Take 1 tablet by mouth daily.    [provider]  benztropine (COGENTIN) 0.5 MG tablet Take 0.5 mg by mouth 2 (two) times daily.    [provider]  calcium carbonate (TUMS - DOSED IN MG ELEMENTAL CALCIUM) 500 MG chewable tablet Chew 1 tablet by mouth 2 (  two) times daily.    [provider]  cephALEXin (KEFLEX) 500 MG capsule Take 1 capsule (500 mg total) by mouth 2 (two) times daily. 03/12/21   Minna Antis, MD  Chloroxylenol-Zinc Oxide (BAZA EX) Apply topically See admin instructions. Apply barrier cream topically to right buttock lesion after each clean    [provider]  cyclobenzaprine (FLEXERIL) 5 MG tablet Take 5 mg by mouth 2 (two) times daily as needed for muscle  spasms. Patient not taking: Reported on 03/12/2021    [provider]  famotidine (PEPCID) 20 MG tablet Take 20 mg by mouth at bedtime.    [provider]  fluPHENAZine (PROLIXIN) 5 MG tablet Take 5 mg by mouth 2 (two) times daily. At 1400 and 2000    [provider]  fluPHENAZine decanoate (PROLIXIN) 25 MG/ML injection Inject into the muscle every 14 (fourteen) days. Patient not taking: Reported on 03/12/2021    [provider]  lacosamide (VIMPAT) 200 MG TABS tablet Take one tablet by mouth twice daily 08/15/13   Sharon Seller, NP  lamoTRIgine (LAMICTAL) 200 MG tablet Take 200 mg by mouth 2 (two) times daily.     [provider]  levETIRAcetam (KEPPRA) 1000 MG tablet Take 1 tablet (1,000 mg total) by mouth 2 (two) times daily. 07/14/13   Street, Stephanie Coup, MD  metoprolol (LOPRESSOR) 50 MG tablet Take 75 mg by mouth 2 (two) times daily. Do not administer if pulse <50    [provider]  naproxen (NAPROSYN) 500 MG tablet Take 500 mg by mouth 2 (two) times daily. Patient not taking: Reported on 03/12/2021 02/19/21   [provider]  oxybutynin (DITROPAN) 5 MG tablet Take 5 mg by mouth 2 (two) times daily.    [provider]  phenytoin (DILANTIN) 100 MG ER capsule Take 100 mg by mouth 2 (two) times daily.     [provider]  polyethylene glycol (MIRALAX / GLYCOLAX) 17 g packet Take 17 g by mouth daily.    [provider]  thiamine (VITAMIN B-1) 100 MG tablet Take 100 mg by mouth daily.    [provider]    Allergies Depakote [valproic acid], Haldol [haloperidol lactate], Penicillins, Shellfish allergy, and Trileptal [oxcarbazepine]  No family history on file.  Social History Social History   Tobacco Use   Smoking status: Every Day    Packs/day: 1.00    Years: 50.00    Pack years: 50.00    Types: Cigarettes   Smokeless tobacco: Never    Review of Systems  Unable to be accurately  assessed due to patient's altered mentation ____________________________________________   PHYSICAL EXAM:  VITAL SIGNS: Vitals:   03/26/2021 1450 03/12/2021 1455  BP: (!) 175/95 (!) 149/88  Pulse: (!) 102 95  Resp: 20 (!) 22  Temp:    SpO2: 98% 100%     Constitutional: Alert with eyes open.  Does not follow commands.  Mute. Eyes: Conjunctivae are normal. PERRL. EOMI. Head: Atraumatic. Nose: No congestion/rhinnorhea. Mouth/Throat: Mucous membranes are dry.  Oropharynx non-erythematous.  Edentulous Neck: No stridor. No cervical spine tenderness to palpation. Cardiovascular: Tachycardic rate, regular rhythm. Grossly normal heart sounds.  Good peripheral circulation. Respiratory: Tachypneic to the low 20s without retractions. Gastrointestinal: Soft , nondistended Musculoskeletal: No lower extremity edema.  No joint effusions. No signs of acute trauma. Neurologic:  No gross focal neurologic deficits are appreciated. . Transient episode of seizure-like activity was noted, as indicated separately.  For a matter of  seconds, she had rightward and head deviation, rhythmic jerking of her mandible and right distal arm.  After this episode, no clonus or additional seizure-like activity was noted. [id number] Skin:  Skin is warm, dry and intact. No rash noted. Psychiatric: Mood and affect are normal. Speech and behavior are normal.  ____________________________________________   LABS (all labs ordered are listed, but only abnormal results are displayed)  Labs Reviewed  COMPREHENSIVE METABOLIC PANEL - Abnormal; Notable for the following components:      Result Value   Glucose, Bld 166 (*)    BUN 34 (*)    Creatinine, Ser 1.29 (*)    AST 43 (*)    GFR, Estimated 44 (*)    All other components within normal limits  CBC WITH DIFFERENTIAL/PLATELET - Abnormal; Notable for the following components:   WBC 13.7 (*)    Hemoglobin 16.1 (*)    HCT 47.1 (*)    Neutro Abs 8.5 (*)    Lymphs Abs 4.3  (*)    All other components within normal limits  URINALYSIS, COMPLETE (UACMP) WITH MICROSCOPIC - Abnormal; Notable for the following components:   Color, Urine YELLOW (*)    APPearance CLOUDY (*)    Hgb urine dipstick MODERATE (*)    Ketones, ur 80 (*)    Protein, ur 100 (*)    Nitrite POSITIVE (*)    Leukocytes,Ua LARGE (*)    WBC, UA >50 (*)    Bacteria, UA FEW (*)    All other components within normal limits  BLOOD GAS, ARTERIAL - Abnormal; Notable for the following components:   pO2, Arterial 111 (*)    All other components within normal limits  RESP PANEL BY RT-PCR (FLU A&B, COVID) ARPGX2  URINE CULTURE  CULTURE, BLOOD (ROUTINE X 2)  CULTURE, BLOOD (ROUTINE X 2)  LACTIC ACID, PLASMA  PROTIME-INR  APTT  LACTIC ACID, PLASMA  BLOOD GAS, ARTERIAL  PHENYTOIN LEVEL, FREE AND TOTAL   ____________________________________________  12 Lead EKG  Sinus tachycardia, rate of 115 bpm.  Normal axis and intervals.  No STEMI.  Inferior and lateral mild ST depressions ____________________________________________  RADIOLOGY  ED MD interpretation: 1 view CXR without infiltration.  Official radiology report(s): DG Chest Port 1 View  Result Date: 03/10/2021 CLINICAL DATA:  Sepsis.  Altered mental status.  Fever. EXAM: PORTABLE CHEST 1 VIEW COMPARISON:  09/26/2020 FINDINGS: The patient is rotated to the right on today's radiograph, reducing diagnostic sensitivity and specificity. Atherosclerotic calcification of the aortic arch. Heart size within normal limits. No blunting of the costophrenic angles. Mild degenerative glenohumeral arthropathy on the left. Linear subsegmental atelectasis or scarring at the right lung base. IMPRESSION: 1. Subsegmental atelectasis at the right lung base; no compelling findings for active pneumonia. 2.  Aortic Atherosclerosis (ICD10-I70.0). Electronically Signed   By: Gaylyn Rong M.D.   On: 03/24/2021 13:42   CT Renal Stone Study  Result Date:  03/04/2021 CLINICAL DATA:  Sepsis, urinary tract infections EXAM: CT ABDOMEN AND PELVIS WITHOUT CONTRAST TECHNIQUE: Multidetector CT imaging of the abdomen and pelvis was performed following the standard protocol without IV contrast. COMPARISON:  02/14/2013 FINDINGS: Lower chest: Mild scarring or atelectasis in the right lower lobe. Circumflex and right coronary artery and descending thoracic aortic atherosclerotic calcification. Hepatobiliary: Unremarkable Pancreas: Unremarkable Spleen: Unremarkable Adrenals/Urinary Tract: Low-density fullness of both adrenal glands without mass identified. Vascular calcification along the left renal hilum, image 74 series 5. Punctate 1-2 mm right kidney upper pole calcification on image 84 series  5, probably a renal calculus. Borderline right hydronephrosis without ureteral calculus or specific cause identified. Stomach/Bowel: Descending and sigmoid colon diverticulosis without findings of active diverticulitis. Circumferential wall thickening in the lower rectum extending to the anorectal junction for example on image 77 series 2, cannot exclude inflammation or tumor, correlate with digital rectal exam findings. Vascular/Lymphatic: Aortoiliac atherosclerotic vascular disease. Reproductive: Posterior uterine body fibroid. Other: Cutaneous and subcutaneous edema posterolateral to the right hip on image 87 series 2. Musculoskeletal: Severe bilateral hip arthropathy with protrusio. Lumbar spondylosis and degenerative disc disease causing multilevel impingement. Mild grade 1 degenerative retrolisthesis at L2-3. Small indirect right inguinal hernia contains adipose tissue. IMPRESSION: 1. There borderline right hydronephrosis without ureteral calculus or visible cause. 2. Punctate 1-2 mm right kidney upper pole nonobstructive renal calculus. 3. Wall thickening in the lower rectum and anorectal junction, inflammation or tumor not excluded. 4.  Aortic Atherosclerosis (ICD10-I70.0).   Coronary atherosclerosis. 5. Descending and sigmoid colon diverticulosis. 6. Uterine fibroid. 7. Cutaneous and subcutaneous edema overlying the right hip, cause uncertain. 8. Severe bilateral hip arthropathy with protrusio. 9. Multilevel lumbar impingement. Electronically Signed   By: Gaylyn Rong M.D.   On: 03/19/2021 14:25    ____________________________________________   PROCEDURES and INTERVENTIONS  Procedure(s) performed (including Critical Care):  .1-3 Lead EKG Interpretation  Date/Time: 03/22/2021 3:53 PM Performed by: Delton Prairie, MD Authorized by: Delton Prairie, MD     Interpretation: abnormal     ECG rate:  120   ECG rate assessment: tachycardic     Rhythm: sinus tachycardia     Ectopy: none     Conduction: normal   Procedure Name: Intubation Date/Time: 03/17/2021 3:53 PM Performed by: Delton Prairie, MD Pre-anesthesia Checklist: Patient identified, Patient being monitored, Emergency Drugs available, Timeout performed and Suction available Oxygen Delivery Method: Non-rebreather mask Preoxygenation: Pre-oxygenation with 100% oxygen Induction Type: Rapid sequence Ventilation: Mask ventilation without difficulty Laryngoscope Size: Glidescope and 3 Tube size: 7.5 mm Number of attempts: 1 Airway Equipment and Method: Rigid stylet Placement Confirmation: ETT inserted through vocal cords under direct vision, CO2 detector and Breath sounds checked- equal and bilateral Tube secured with: ETT holder Comments: Copious frothy white sputum in the posterior oropharynx and around the airway.    .Critical Care  Date/Time: 03/12/2021 3:53 PM Performed by: Delton Prairie, MD Authorized by: Delton Prairie, MD   Critical care provider statement:    Critical care time (minutes):  75   Critical care was necessary to treat or prevent imminent or life-threatening deterioration of the following conditions:  CNS failure or compromise   Critical care was time spent personally by me on the  following activities:  Discussions with consultants, evaluation of patient's response to treatment, examination of patient, ordering and performing treatments and interventions, ordering and review of laboratory studies, ordering and review of radiographic studies, pulse oximetry, re-evaluation of patient's condition, obtaining history from patient or surrogate and review of old charts  Medications  propofol (DIPRIVAN) 1000 MG/100ML infusion (15 mcg/kg/min  52.2 kg Intravenous New Bag/Given 03/29/2021 1427)  fentaNYL in NS (27mcg/ml) infusion-PREMIX (100 mcg/hr Intravenous New Bag/Given 03/26/2021 1426)  lactated ringers bolus 1,000 mL (1,000 mLs Intravenous New Bag/Given 03/11/2021 1323)  acetaminophen (TYLENOL) suppository 650 mg (650 mg Rectal Given 03/01/2021 1324)  levETIRAcetam (KEPPRA) IVPB 1500 mg/ 100 mL premix (0 mg Intravenous Stopped 03/17/2021 1429)  cefTRIAXone (ROCEPHIN) 1 g in sodium chloride 0.9 % 100 mL IVPB (0 g Intravenous Stopped 03/27/2021 1429)  succinylcholine (ANECTINE) injection 100  mg (100 mg Intravenous Given 03-28-2021 1417)  etomidate (AMIDATE) injection 15 mg (15 mg Intravenous Given March 28, 2021 1418)  LORazepam (ATIVAN) injection 1 mg (1 mg Intravenous Given 03-28-21 1414)  lacosamide (VIMPAT) 200 mg in sodium chloride 0.9 % 25 mL IVPB (200 mg Intravenous New Bag/Given 28-Mar-2021 1456)    ____________________________________________   MDM / ED COURSE   75 year old woman with history of seizure disorder on multiple antiepileptics presents to the ED with evidence of sepsis precipitating status epilepticus requiring intubation and transfer to facility capable of continuous EEG monitoring.  No shock or need for pressors.  Quite hypertensive, and blood pressure settles out nicely with propofol/fentanyl drips.  Cultures drawn and sepsis protocols followed with Rocephin provided due to recent diagnosis of UTI.  UA remains consistent with UTI as a likely etiology of her sepsis.  Noted  to have multiple partial seizures here in the ED with rightward eye and head deviation, right arm twitching, requiring single dose of IV Ativan to abort this and load of IV Keppra.  Considering the lack of return to baseline, she meets criteria for status epilepticus.  Due to our inability to obtain continuous EEG monitoring here at this hospital, we will plan for transfer to [facility] for further work-up and management.  Clinical Course as of 03-28-2021 1557  Fri 2021/03/28  1308 Reassessed.  Went into the room, she was noted to have rhythmic activity concerning for seizure.  Rhythmic jerking of the right arm, head and eyes deviated to the right and twitching of her jaw.  This resolves in a matter of seconds.  I advised the nurse of her medical history and we discussed plan of care. [DS]  1312 Called pt guardian, Adela Lank, DSS rep and guardian. We discussed critical illness and presentation. I ask her about living wills, advanced directives, etc. She is unaware of any advanced directives. No DNR on file.  [DS]  1355 Reassessed after CT scans.  No improvement of her mental status.  I am concerned about her airway and depressed GCS of about 6.  We will intubate. [DS]  1415 Dr. Wilford Corner, neurology on call, was kind enough to come by and see the patient in the ED just prior to intubation. Examined and provided his input. Recommends transfer to [facility] for NeuroICU coverage and continuous EEG, as we do not have continuous EEG here.  [DS]  1420 Intubated without difficulty. Noted to have quite a bit of frothy white sputum to her posterior oropharynx and around the cords. [DS]  1534 I speak with Dr. Iver Nestle, neuro at [facility].  Slight delay due to acuity in our ED, having to intubate and code another critical patient [DS]  1541 I discuss with Dr. Leonette Monarch, ED physician at [facility]. [DS]  1544 They have no current open beds or rooms, but they will try to shift people around to make room for this patient as  needed.  He will call me back [DS]  1552 Headed to CT [DS]    Clinical Course User Index [DS] Delton Prairie, MD    ____________________________________________   FINAL CLINICAL IMPRESSION(S) / ED DIAGNOSES  Final diagnoses:  Sepsis with encephalopathy without septic shock, due to unspecified organism [facility])  Seizure (HCC)  Status epilepticus [facility])     ED Discharge Orders     None        Mechell Girgis   Note:  This document was prepared using Dragon voice recognition software and may include unintentional dictation  errors.    Delton PrairieSmith, Marleah Beever, MD 2021-04-25 [phone]

## 2021-03-13 NOTE — Progress Notes (Signed)
SAME DAY FOLLOW UP  I have seen and examined the pt at [facility]. She is on propofol and fentanyl post intubation. No sz reported after intubation. On exam, she is on sedation, no spontaneous movements, no response to voice or nox sti. Gaze is midline. PERRL sluggish.  Updated recs: -Stat cEEG - technologist notified. -Continue Keppra 1g BID IV, Lamotrigine 200mg  BID per tube and then po when able to. Vimpat 200mg  BID IV and  -Management of UTI, source of infection per PCCM -PHT level is pending. Dosage recs based on level. -Sz precautions. Neurology at [facility] will continue to follow. D/w PCCM APP team  -- , MD Neurologist [facility] Pager: [phone]

## 2021-03-13 NOTE — H&P (Signed)
NAME:  [name], MRN:  [id number], DOB:  May 11, 1946, LOS: 0 ADMISSION DATE:  2020/11/12, CONSULTATION DATE:  7/15 REFERRING MD:  Katrinka BlazingSmith, CHIEF COMPLAINT:  vent management and critical care in setting of status epilepticus   History of Present Illness:  75 year old female patient who resides at a skilled nursing facility due to cognitive impairments as a consequences of schizophrenia, and dementia.  It is unclear what her functional status is otherwise.  Initially brought to the emergency room on 7/14 after having a witnessed single episode of seizure found to have a urinary tract infection, no further seizure witnessed, sent back to skilled nursing facility on Keflex...  Apparently continued to be in encephalopathic after returning to nursing home, had fever of 101.6, as well as  new tachycardia and was sent back to the emergency room for evaluation.  Was to be admitted with a working diagnosis of urinary tract sepsis and treated with antibiotics but subsequently had witnessed seizure while in the emergency room this consisted primarily of right gaze deviation, right face and neck arm twitching, followed by worsening mental status.  She was administered benzodiazepines, IV Keppra, and subsequently intubated for airway protection and transferred to [facility] for neuro critical care services and continuous EEG monitoring.  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  Hypertension, asthma.  Prior stroke.  Hyperlipidemia.  Resides at skilled nursing facility. Significant Hospital Events: Including procedures, antibiotic start and stop dates in addition to other pertinent events   7/14: Seen in emergency room for seizure, diagnosed with urinary tract infection, sent back to nursing home on Keflex 7/15: Persistent encephalopathy, new fever and  tachycardia presented back to the emergency room with working diagnosis of of urosepsis but subsequently had several witnessed seizures, and loss of airway protection requiring intubation transferred from [place] [name] for neuro critical care services and continuous EEG monitoring Interim History / Subjective:  Unresponsive on ventilator Objective   Blood pressure 98/72, pulse 90, resp. rate 16, SpO2 100 %.    Vent Mode: PRVC FiO2 (%):  [32 %-100 %] 40 % Set Rate:  [16 bmp] 16 bmp Vt Set:  [400 mL-490 mL] 490 mL PEEP:  [5 cmH20] 5 cmH20  No intake or output data in the 24 hours ending 03/27/2021 1849 There were no vitals filed for this visit.  Examination: General: Chronically ill-appearing 75 year old black female currently on full ventilatory support HENT: Sclera nonicteric bilateral cataracts pupils not responsive and pinpoint Lungs: Diffuse scattered rhonchi equal bilateral chest rise Cardiovascular: Regular rate and rhythm without murmur rub or gallop Abdomen: Soft nontender Extremities: Warm, dry, perfused lower extremities slightly contracted pulses are Neuro: Unresponsive even to noxious stimulus GU: Due to void  Resolved Hospital Problem list     Assessment & Plan:  Status epilepticus.  Has history of known seizure disorder.  On 4 anticonvulsants currently.  Suspect this is breakthrough from acute urinary tract infection, also has fever both of which could decrease seizure threshold Plan Continuing Keppra, phenytoin, Lamictal and Vimpat Has gotten additional Keppra and Vimpat bolus Continuous EEG monitoring Seizure precautions As needed benzodiazepines for breakthrough seizure Additional recommendations as per neurology  Acute metabolic encephalopathy, superimposed on history of underlying dementia and schizophrenia.  Unclear what her baseline functional and cognitive status is by chart review: Suspect some of this is a postictal state and  sepsis Plan Treat sepsis Treat status epilepticus   Acute hypoxic respiratory failure in the setting of ineffective airway protection and possible aspiration. Portable chest x-ray initially shows endotracheal tube at the level of carina certainly could be retracted, some basilar atelectasis cannot rule out early aspiration Plan Stat chest x-ray to determine ET tube placement status post transport Full ventilatory support Respiratory culture A.m. chest x-ray VAP bundle  Severe sepsis/septic shock as evidenced by lactic acidosis in the setting of urinary tract infection  Cultures have been sent Plan IV ceftriaxone Follow-up cultures IV hydration, following IV bolus Follow-up lactate Telemetry monitoring Hold any antihypertensives. F/u cultures blood and urine sent  Day 1 ceftriaxone   Lactic acidosis.  Suspect this is multifactorial: Seizure, and possible sepsis. Plan Treat sepsis Treat seizures Repeat lactate  Acute kidney injury: Secondary to shock Plan Volume resuscitation Holding all antihypertensives Renal dose medication Strict intake output  Diabetes with hyperglycemia Plan Sliding scale insulin  Mild anemia w/out evidence of bleeding Plan  Trend cbc  Best Practice (right click and "Reselect all SmartList Selections" daily)   Diet/type: tubefeeds DVT prophylaxis: prophylactic heparin  GI prophylaxis: N/A Lines: N/A Foley:  N/A Code Status:  full code Last date of multidisciplinary goals of care discussion [pending ]  Labs   CBC: Recent Labs  Lab 03/12/21 1118 03/25/2021 1306  WBC 5.9 13.7*  NEUTROABS  --  8.5*  HGB 14.1 16.1*  HCT 42.4 47.1*  MCV 98.1 96.7  PLT 209 197    Basic Metabolic Panel: Recent Labs  Lab 03/12/21 1118 03/09/2021 1306  NA 138 139  K 4.3 3.9  CL 100 100  CO2 26 24  GLUCOSE 141* 166*  BUN 10 34*  CREATININE 0.47 1.29*  CALCIUM 9.6 9.6   GFR: Estimated Creatinine Clearance: 32 mL/min (A) (by C-G formula based  on SCr of 1.29 mg/dL (H)). Recent Labs  Lab 03/12/21 1118 03/02/2021 1306 03/12/2021 1513  WBC 5.9 13.7*  --   LATICACIDVEN  --  1.8 3.5*    Liver Function Tests: Recent Labs  Lab 03/12/21 1118 03/19/2021 1306  AST 37 43*  ALT 26 27  ALKPHOS 101 105  BILITOT 0.9 1.0  PROT 7.4 7.9  ALBUMIN 4.1 4.3   No results for input(s): LIPASE, AMYLASE in the last 168 hours. No results for input(s): AMMONIA in the last 168 hours.  ABG    Component Value Date/Time   PHART 7.44 03/07/2021 1534   PCO2ART 32 03/19/2021 1534   PO2ART 111 (H) 03/14/2021 1534   HCO3 21.7 03/21/2021 1534   TCO2 21 07/11/2013 1157   ACIDBASEDEF 1.5 03/03/2021 1534   O2SAT 98.5 03/03/2021 1534     Coagulation Profile: Recent Labs  Lab 03/12/2021 1306  INR 1.0    Cardiac Enzymes: No results for input(s): CKTOTAL, CKMB, CKMBINDEX, TROPONINI in the last 168 hours.  HbA1C: Hemoglobin A1C  Date/Time Value Ref Range Status  02/14/2013 01:40 AM 6.7 (H) 4.2 - 6.3 % Final    Comment:    The American Diabetes Association recommends that a primary goal of therapy should be <7% and that physicians should reevaluate the treatment regimen in patients with HbA1c values consistently >8%.    Hgb A1c MFr Bld  Date/Time Value Ref Range Status  09/26/2019 12:11 PM 6.6 (H) 4.8 - 5.6 % Final    Comment:    (NOTE) Pre diabetes:          5.7%-6.4% Diabetes:              >  6.4% Glycemic control for   <7.0% adults with diabetes   07/06/2013 05:47 AM 6.7 (H) <5.7 % Final    Comment:    (NOTE)                                                                       According to the ADA Clinical Practice Recommendations for 2011, when HbA1c is used as a screening test:  >=6.5%   Diagnostic of Diabetes Mellitus           (if abnormal result is confirmed) 5.7-6.4%   Increased risk of developing Diabetes Mellitus References:Diagnosis and Classification of Diabetes Mellitus,Diabetes Care,2011,34(Suppl 1):S62-S69 and  Standards of Medical Care in         Diabetes - 2011,Diabetes Care,2011,34 (Suppl 1):S11-S61.    CBG: Recent Labs  Lab 03/12/21 1113  GLUCAP 145*    Review of Systems:   Not able   Past Medical History:  She,  has a past medical history of Asthma, CHF (congestive heart failure) (HCC), Diabetes mellitus without complication (HCC), Hepatitis A, Hepatitis B, Hepatitis C, Hypertension, Respiratory failure (HCC), Schizophrenia (HCC), Seizures (HCC), and Smoker.   Surgical History:  No past surgical history on file.   Social History:   reports that she has been smoking cigarettes. She has a 50.00 pack-year smoking history. She has never used smokeless tobacco.   Family History:  Her family history is not on file.   Allergies Allergies  Allergen Reactions   Depakote [Valproic Acid] Other (See Comments)    unknown   Haldol [Haloperidol Lactate] Other (See Comments)    Unknown    Penicillins Other (See Comments)    Unknown    Shellfish Allergy Other (See Comments)    unknown   Trileptal [Oxcarbazepine] Other (See Comments)    unknown     Home Medications  Prior to Admission medications   Medication Sig Start Date End Date Taking? Authorizing Provider  acetaminophen (TYLENOL) 500 MG tablet Take 500 mg by mouth every 6 (six) hours as needed for pain.    [provider]  aspirin EC 81 MG tablet Take 81 mg by mouth daily.    [provider]  atorvastatin (LIPITOR) 40 MG tablet Take 1 tablet (40 mg total) by mouth daily at 6 PM. 11/03/16   Katharina Caper, MD  B Complex-C (B-COMPLEX WITH VITAMIN C) tablet Take 1 tablet by mouth daily.    [provider]  benztropine (COGENTIN) 0.5 MG tablet Take 0.5 mg by mouth 2 (two) times daily.    [provider]  calcium carbonate (TUMS - DOSED IN MG ELEMENTAL CALCIUM) 500 MG chewable tablet Chew 1 tablet by mouth 2 (two) times daily.    [provider]  cephALEXin (KEFLEX) 500 MG capsule Take 1  capsule (500 mg total) by mouth 2 (two) times daily. Patient not taking: Reported on Mar 29, 2021 03/12/21   Minna Antis, MD  Chloroxylenol-Zinc Oxide [facility] EX) Apply topically See admin instructions. Apply barrier cream topically to right buttock lesion after each clean Patient not taking: Reported on March 29, 2021    [provider]  cyclobenzaprine (FLEXERIL) 5 MG tablet Take 5 mg by mouth 2 (two) times daily as needed for muscle spasms. Patient not taking:  No sig reported    [provider]  famotidine (PEPCID) 20 MG tablet Take 20 mg by mouth at bedtime.    [provider]  fluPHENAZine (PROLIXIN) 5 MG tablet Take 5 mg by mouth 2 (two) times daily. At 1400 and 2000    [provider]  fluPHENAZine decanoate (PROLIXIN) 25 MG/ML injection Inject into the muscle every 14 (fourteen) days. Patient not taking: No sig reported    [provider]  lacosamide (VIMPAT) 200 MG TABS tablet Take one tablet by mouth twice daily 08/15/13   Sharon Seller, NP  lamoTRIgine (LAMICTAL) 200 MG tablet Take 200 mg by mouth 2 (two) times daily.     [provider]  levETIRAcetam (KEPPRA) 1000 MG tablet Take 1 tablet (1,000 mg total) by mouth 2 (two) times daily. 07/14/13   Street, Stephanie Coup, MD  metoprolol (LOPRESSOR) 50 MG tablet Take 75 mg by mouth 2 (two) times daily. Do not administer if pulse <50    [provider]  naproxen (NAPROSYN) 500 MG tablet Take 500 mg by mouth 2 (two) times daily. Patient not taking: No sig reported 02/19/21   [provider]  oxybutynin (DITROPAN) 5 MG tablet Take 5 mg by mouth 2 (two) times daily.    [provider]  phenytoin (DILANTIN) 100 MG ER capsule Take 100 mg by mouth 2 (two) times daily.     [provider]  polyethylene glycol (MIRALAX / GLYCOLAX) 17 g packet Take 17 g by mouth daily.    [provider]  thiamine (VITAMIN B-1) 100 MG tablet Take 100 mg by mouth daily.     [provider]     Critical care time: 54 minutes     Simonne Martinet ACNP-BC [facility] Pulmonary/Critical Care Pager # [phone] OR # [phone] if no answer

## 2021-03-13 NOTE — ED Triage Notes (Signed)
PT to ED via [facility] from [facility] for known UTI and fever. Pt dx'ed with UTI yesterday and given antibiotics, pt not able to take them due to not being able to swallow. Per Staff at home pt has been altered today and had temp of 101.6. Pt tachycardic with EMS. Pt is alert upon arrival to ED.,

## 2021-03-13 NOTE — Progress Notes (Signed)
eLink Physician-Brief Progress Note Patient Name: KHAI TORBERT DOB: November 14, 1945 MRN: [id number]   Date of Service  03-19-2021  HPI/Events of Note  Patient seen in the ED at [facility] with suspected UTI, and break through seizures requiring intubation for airway protection, she was transferred to [facility] for access to continuous EEG, neurology is following and has made adjustments to her anti-epileptic regimen, UTI is being treated.  eICU Interventions  New Patient Evaluation.        Thomasene Lot Delyla Sandeen March 19, 2021, 10:18 PM

## 2021-03-13 NOTE — Consult Note (Signed)
Neurology Consultation  Reason for Consult: Seizures and status epilepticus Referring Physician: Dr. Delton Prairie, ED provider  CC: Seizures, status epilepticus  History is obtained from: Chart  HPI: [name] is a 75 y.o. female past medical history of hypertension, hyperlipidemia, diabetes, asthma, seizure disorder, dementia, schizophrenia, hepatitis A, B, and C, tobacco abuse, resident of a facility, brought in for evaluation of altered mental status, seizures and a fever. According to the chart review and report received from the ER, she was brought in yesterday, for a single seizure, found to have a UTI, started on antibiotics, and sent back home.  She continued to not be her normal self-at baseline presumably she can talk but has dementia. She had a fever of 101.5 Fahrenheit noted in the emergency room.  Tachycardic, has been altered all of today-unclear last known well. In the emergency room she had multiple episodes of right gaze deviation, right face neck and arm twitching. Imaging in the form of CT head was done yesterday   ROS: Unable to obtain due to altered mental status.   Past Medical History:  Diagnosis Date   Asthma    CHF (congestive heart failure) (HCC)    Diabetes mellitus without complication (HCC)    Hepatitis A    Hepatitis B    Hepatitis C    Hypertension    Respiratory failure (HCC)    Schizophrenia (HCC)    Seizures (HCC)    Smoker    1 pack daily        No family history on file.   Social History:   reports that she has been smoking cigarettes. She has a 50.00 pack-year smoking history. She has never used smokeless tobacco. No history on file for alcohol use and drug use.  Medications  Current Facility-Administered Medications:    etomidate (AMIDATE) injection 15 mg, 15 mg, Intravenous, Once, Delton Prairie, MD   fentaNYL in NS (59mcg/ml) infusion-PREMIX, 50 mcg/hr, Intravenous, Continuous, Delton Prairie, MD   levETIRAcetam  (KEPPRA) IVPB 1500 mg/ 100 mL premix, 1,500 mg, Intravenous, Once, Delton Prairie, MD, Last Rate: 400 mL/hr at 02/28/2021 1414, 1,500 mg at 02/27/2021 1414   propofol (DIPRIVAN) 1000 MG/100ML infusion, 5-80 mcg/kg/min, Intravenous, Continuous, Delton Prairie, MD   succinylcholine (ANECTINE) injection 100 mg, 100 mg, Intravenous, Once, Delton Prairie, MD  Current Outpatient Medications:    acetaminophen (TYLENOL) 500 MG tablet, Take 500 mg by mouth every 6 (six) hours as needed for pain., Disp: , Rfl:    aspirin EC 81 MG tablet, Take 81 mg by mouth daily., Disp: , Rfl:    atorvastatin (LIPITOR) 40 MG tablet, Take 1 tablet (40 mg total) by mouth daily at 6 PM. (Patient taking differently: Take 40 mg by mouth every evening.), Disp: 30 tablet, Rfl: 5   B Complex-C (B-COMPLEX WITH VITAMIN C) tablet, Take 1 tablet by mouth daily., Disp: , Rfl:    benztropine (COGENTIN) 0.5 MG tablet, Take 0.5 mg by mouth 2 (two) times daily., Disp: , Rfl:    calcium carbonate (TUMS - DOSED IN MG ELEMENTAL CALCIUM) 500 MG chewable tablet, Chew 1 tablet by mouth 2 (two) times daily., Disp: , Rfl:    cephALEXin (KEFLEX) 500 MG capsule, Take 1 capsule (500 mg total) by mouth 2 (two) times daily., Disp: 14 capsule, Rfl: 0   Chloroxylenol-Zinc Oxide (BAZA EX), Apply topically See admin instructions. Apply barrier cream topically to right buttock lesion after each clean, Disp: , Rfl:    cyclobenzaprine (FLEXERIL)  5 MG tablet, Take 5 mg by mouth 2 (two) times daily as needed for muscle spasms. (Patient not taking: Reported on 03/12/2021), Disp: , Rfl:    famotidine (PEPCID) 20 MG tablet, Take 20 mg by mouth at bedtime., Disp: , Rfl:    fluPHENAZine (PROLIXIN) 5 MG tablet, Take 5 mg by mouth 2 (two) times daily. At 1400 and 2000, Disp: , Rfl:    fluPHENAZine decanoate (PROLIXIN) 25 MG/ML injection, Inject into the muscle every 14 (fourteen) days. (Patient not taking: Reported on 03/12/2021), Disp: , Rfl:    lacosamide (VIMPAT) 200 MG TABS  tablet, Take one tablet by mouth twice daily, Disp: 60 tablet, Rfl: 5   lamoTRIgine (LAMICTAL) 200 MG tablet, Take 200 mg by mouth 2 (two) times daily. , Disp: , Rfl:    levETIRAcetam (KEPPRA) 1000 MG tablet, Take 1 tablet (1,000 mg total) by mouth 2 (two) times daily., Disp: , Rfl:    metoprolol (LOPRESSOR) 50 MG tablet, Take 75 mg by mouth 2 (two) times daily. Do not administer if pulse <50, Disp: , Rfl:    naproxen (NAPROSYN) 500 MG tablet, Take 500 mg by mouth 2 (two) times daily. (Patient not taking: Reported on 03/12/2021), Disp: , Rfl:    oxybutynin (DITROPAN) 5 MG tablet, Take 5 mg by mouth 2 (two) times daily., Disp: , Rfl:    phenytoin (DILANTIN) 100 MG ER capsule, Take 100 mg by mouth 2 (two) times daily. , Disp: , Rfl:    polyethylene glycol (MIRALAX / GLYCOLAX) 17 g packet, Take 17 g by mouth daily., Disp: , Rfl:    thiamine (VITAMIN B-1) 100 MG tablet, Take 100 mg by mouth daily., Disp: , Rfl:    Exam: Current vital signs: BP (!) 198/98   Pulse (!) 108   Temp (!) 101.1 F (38.4 C)   Resp 20   Ht 5\' 7"  (1.702 m)   Wt 52.2 kg   SpO2 97%   BMI 18.01 kg/m  Vital signs in last 24 hours: Temp:  [101.1 F (38.4 C)] 101.1 F (38.4 C) (07/15 1254) Pulse Rate:  [71-114] 108 (07/15 1330) Resp:  [12-22] 20 (07/15 1330) BP: (172-212)/(87-98) 198/98 (07/15 1330) SpO2:  [93 %-100 %] 97 % (07/15 1330) Weight:  [52.2 kg] 52.2 kg (07/15 1405) General: Eyes open but appears unresponsive. HEENT: Normocephalic atraumatic, edentulous Lungs: Gurgling upper airway sounds, scattered rales CVS: Regular rate rhythm Extremities warm well perfused Neurological exam She has her eyes open but it appears that she is unresponsive to voice or noxious stimulation for most part. Non verbal Cranials: Pupils equal round react light, extraocular movements difficult to assess, does not blink to threat from any side.  As I was examining her, she had a rightward gaze deviation with right face, neck and  arm twitching along with right eye twitching.  This resolved with Ativan and load of Keppra being given. Motor examination: Minimal withdrawal to noxious stimulation on the left leg but otherwise no response to noxious stimulation. Sensory examination: As above Coordination cannot be assessed  Labs I have reviewed labs in epic and the results pertinent to this consultation are:   CBC    Component Value Date/Time   WBC 13.7 (H) 03-Apr-2021 1306   RBC 4.87 2021/04/03 1306   HGB 16.1 (H) April 03, 2021 1306   HGB 13.0 11/21/2013 0422   HCT 47.1 (H) 04-03-2021 1306   HCT 39.4 11/21/2013 0422   PLT 197 2021-04-03 1306   PLT 173 11/21/2013 0422  MCV 96.7 03/27/2021 1306   MCV 97 11/21/2013 0422   MCH 33.1 03/12/2021 1306   MCHC 34.2 03/25/2021 1306   RDW 13.2 03/08/2021 1306   RDW 14.4 11/21/2013 0422   LYMPHSABS 4.3 (H) 03/07/2021 1306   LYMPHSABS 2.0 11/21/2013 0422   MONOABS 0.8 03/26/2021 1306   MONOABS 0.5 11/21/2013 0422   EOSABS 0.0 03/11/2021 1306   EOSABS 0.1 11/21/2013 0422   BASOSABS 0.0 03/01/2021 1306   BASOSABS 0.0 11/21/2013 0422    CMP     Component Value Date/Time   NA 139 03/29/2021 1306   NA 137 11/21/2013 0422   K 3.9 03/12/2021 1306   K 3.9 11/21/2013 0422   CL 100 03/29/2021 1306   CL 103 11/21/2013 0422   CO2 24 03/27/2021 1306   CO2 29 11/21/2013 0422   GLUCOSE 166 (H) 03/11/2021 1306   GLUCOSE 97 11/21/2013 0422   BUN 34 (H) 03/05/2021 1306   BUN 14 11/21/2013 0422   CREATININE 1.29 (H) 02/27/2021 1306   CREATININE 0.52 (L) 11/21/2013 0422   CALCIUM 9.6 02/28/2021 1306   CALCIUM 9.5 11/21/2013 0422   PROT 7.9 03/15/2021 1306   PROT 7.1 11/21/2013 0422   ALBUMIN 4.3 03/12/2021 1306   ALBUMIN 3.2 (L) 11/21/2013 0422   AST 43 (H) 03/22/2021 1306   AST 21 11/21/2013 0422   ALT 27 03/21/2021 1306   ALT 31 11/21/2013 0422   ALKPHOS 105 03/07/2021 1306   ALKPHOS 145 (H) 11/21/2013 0422   BILITOT 1.0 03/29/2021 1306   BILITOT 0.3 11/21/2013  0422   GFRNONAA 44 (L) 03/18/2021 1306   GFRNONAA >60 11/21/2013 0422   GFRAA >60 09/27/2019 0737   GFRAA >60 11/21/2013 0422    Lipid Panel     Component Value Date/Time   CHOL 211 (H) 11/03/2016 0542   CHOL 120 02/14/2013 0140   TRIG 75 11/03/2016 0542   TRIG 147 02/14/2013 0140   HDL 82 11/03/2016 0542   HDL 41 02/14/2013 0140   CHOLHDL 2.6 11/03/2016 0542   VLDL 15 11/03/2016 0542   VLDL 29 02/14/2013 0140   LDLCALC 114 (H) 11/03/2016 0542   LDLCALC 50 02/14/2013 0140     Imaging I have reviewed the images obtained: CT-scan of the brain-old right basal ganglia lacunar infarctions.  No other acute intracranial abnormality.   Assessment:  75 year old with known history of seizures on 4 antiepileptics, coming in with breakthrough seizures in the setting of likely UTI.  Also has fever.  Was started on antibiotics yesterday for UTI but has remained altered and there is concern for breakthrough seizures and status epilepticus. She did have 1 seizure right as I was examining her and has had probably 1 more in the emergency room without returning back to her baseline-which presumably is awake and talking although very demented. No family at bedside to confirm her baseline. She remains full scope of care and full code. At this time requiring consideration for emergent intubation for airway protection.  Breakthrough seizure likely in the setting of underlying systemic infection.  Recommendations: -She requires emergent intubation for airway protection, which is being performed by the EDP at this time. -[facility]. -She has received 1 mg of Ativan followed by a load of Keppra IV in the emergency room. -She is on Keppra, phenytoin, Lamictal and Vimpat for her antiepileptics at home. She has allergy to valproate and Trileptal on her chart. In addition to the  Keppra and Ativan, I will give her l Vimpat 200 mg IV x1. -Once she  has an OG tube, she should also continue to get her home dose of Lamictal. -Check phenytoin level-if normal, resume home dose otherwise load with phenytoin-can ask pharmacy consultation on the loading dose based on the level. -Check CTH today after intubation -Has clear evidence of UTI on labs yesterday, would not chase a CNS infectious pathology unless over the course of next day or 2 she does not show any improvement with treatment of her status epilepticus and UTI. -Do not see a need for spinal tap or meningitic coverage at this time. -Blood cultures, urine culture, respiratory cultures.   Plan discussed in detail with Dr. Delton Prairie and the ER care team at the patient's bedside. Will notify the neuro hospitalist over at [facility]  Would recommend an ER to ER transfer due to lack of beds in the ICU at this time-this will expedite patient's EEG as well as continuing neurological care 24/7 at the tertiary care center.  -- Milon Dikes, MD Neurologist [facility] Pager: [phone]   CRITICAL CARE ATTESTATION Performed by: Milon Dikes, MD Total critical care time: 39 minutes Critical care time was exclusive of separately billable procedures and treating other patients and/or supervising APPs/Residents/Students Critical care was necessary to treat or prevent imminent or life-threatening deterioration due to breakthrough seizures, status epilepticus. This patient is critically ill and at significant risk for neurological worsening and/or death and care requires constant monitoring. Critical care was time spent personally by me on the following activities: development of treatment plan with patient and/or surrogate as well as nursing, discussions with consultants, evaluation of patient's response to treatment, examination of patient, obtaining history from patient or surrogate, ordering and performing treatments and interventions, ordering and review of laboratory  studies, ordering and review of radiographic studies, pulse oximetry, re-evaluation of patient's condition, participation in multidisciplinary rounds and medical decision making of high complexity in the care of this patient.

## 2021-03-13 NOTE — Progress Notes (Signed)
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   Dilantin level has still not been drawn.   Current inpatient anticonvulsant regimen as follows: Vimpat 200 mg IV BID Lamictal 200 mg per tube BID Keppra 1000 mg per tube BID  Home medication regimen is same as above, except also with phenytoin 100 mg ER capsule BID.   Sedatives/drips with anticonvulsant effect are as follows: Midazolam PRN breakthrough seizures Propofol drip 0- 50 mcg/kg/min    BP 111/68   Pulse 88   Temp (!) 100.5 F (38.1 C)   Resp 16   SpO2 100%   Exam reveals an unresponsive patient on sedation with intermittent twitching of her eyes and eyelids, low amplitude. No movement to stimulation.    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 Drawing STAT.  -Already received a loading dose of Keppra and supplemental Vimpat load at [facility] prior to transfer to [facility] - Switching Keppra to IV  - Continue enteral Lamictal - Continue IV Vimpat - She has allergy to valproate and Trileptal on her chart. - Propofol will be used for now to control status.  - When phenytoin level results will use this to guide scheduled dosing and possible IV supplemental load.   Addendum:  - Phenytoin level came back as < 2.5 - Loading fosphenytoin 20 mg/kg PE x 1, then starting IV phenytoin at 100 mg IV TID.   Addendum: - Still with electrographic seizure activity in left sided leads - Increasing propofol infusion rate to 35 mcg/kg per minute.  - Loading propofol 10 mg IV x 1 now.   40 minutes of ICU time.   Electronically signed: Dr. Caryl Pina   Electronically signed: Dr. Caryl Pina

## 2021-03-13 NOTE — Progress Notes (Signed)
RT NOTE:  Pt transported to 2M13 on vent without event. Report given to [place], RRT.

## 2021-03-13 NOTE — ED Notes (Signed)
Report attempted, floor RN in transport w pt, asked to call back in approx 

## 2021-03-13 NOTE — Consult Note (Signed)
Please see consult note from earlier in the day- that was an in-person consult performed by me at [facility]. I have seen the patient again after arrival to [facility] and documented a progress note with updated recs.   -- Milon Dikes, MD Neurologist [facility] Pager: [phone]

## 2021-03-13 NOTE — ED Notes (Signed)
Pharmacy messaged to verify pt medication

## 2021-03-13 NOTE — ED Notes (Signed)
TIME OUT called for intubation for airway protection.

## 2021-03-13 NOTE — ED Notes (Signed)
7.5 tube passed through cords. 25 at the lip. Colormetrics verified. Lung sounds present. OG placed by MD

## 2021-03-13 NOTE — ED Triage Notes (Signed)
Patient BIB [facility] from [facility]. Pt initally seen yesterday for seizure, UTI yesterday, discharged. Back ot [place] today for increase AMS. Pt intubated @  regional to protect airway. VSS. Fentanyl, propofol for sedation.

## 2021-03-14 ENCOUNTER — Inpatient Hospital Stay (HOSPITAL_COMMUNITY): Payer: Medicare Other

## 2021-03-14 DIAGNOSIS — A419 Sepsis, unspecified organism: Principal | ICD-10-CM

## 2021-03-14 DIAGNOSIS — R569 Unspecified convulsions: Secondary | ICD-10-CM | POA: Diagnosis not present

## 2021-03-14 DIAGNOSIS — R9431 Abnormal electrocardiogram [ECG] [EKG]: Secondary | ICD-10-CM

## 2021-03-14 DIAGNOSIS — G40901 Epilepsy, unspecified, not intractable, with status epilepticus: Secondary | ICD-10-CM | POA: Diagnosis not present

## 2021-03-14 DIAGNOSIS — R6521 Severe sepsis with septic shock: Secondary | ICD-10-CM

## 2021-03-14 DIAGNOSIS — J9601 Acute respiratory failure with hypoxia: Secondary | ICD-10-CM | POA: Diagnosis not present

## 2021-03-14 DIAGNOSIS — N3000 Acute cystitis without hematuria: Secondary | ICD-10-CM

## 2021-03-14 DIAGNOSIS — I213 ST elevation (STEMI) myocardial infarction of unspecified site: Secondary | ICD-10-CM

## 2021-03-14 LAB — CBC
HCT: 36.8 % (ref 36.0–46.0)
Hemoglobin: 12.4 g/dL (ref 12.0–15.0)
MCH: 32.4 pg (ref 26.0–34.0)
MCHC: 33.7 g/dL (ref 30.0–36.0)
MCV: 96.1 fL (ref 80.0–100.0)
Platelets: 180 10*3/uL (ref 150–400)
RBC: 3.83 MIL/uL — ABNORMAL LOW (ref 3.87–5.11)
RDW: 13.2 % (ref 11.5–15.5)
WBC: 14.8 10*3/uL — ABNORMAL HIGH (ref 4.0–10.5)
nRBC: 0 % (ref 0.0–0.2)

## 2021-03-14 LAB — ECHOCARDIOGRAM COMPLETE
Area-P 1/2: 3.38 cm2
Height: 67 in
S' Lateral: 2.5 cm
Weight: 1834.23 oz

## 2021-03-14 LAB — MAGNESIUM
Magnesium: 1.7 mg/dL (ref 1.7–2.4)
Magnesium: 1.5 mg/dL — ABNORMAL LOW (ref 1.7–2.4)
Magnesium: 2 mg/dL (ref 1.7–2.4)

## 2021-03-14 LAB — GLUCOSE, CAPILLARY
Glucose-Capillary: 114 mg/dL — ABNORMAL HIGH (ref 70–99)
Glucose-Capillary: 183 mg/dL — ABNORMAL HIGH (ref 70–99)
Glucose-Capillary: 189 mg/dL — ABNORMAL HIGH (ref 70–99)
Glucose-Capillary: 191 mg/dL — ABNORMAL HIGH (ref 70–99)
Glucose-Capillary: 201 mg/dL — ABNORMAL HIGH (ref 70–99)
Glucose-Capillary: 204 mg/dL — ABNORMAL HIGH (ref 70–99)

## 2021-03-14 LAB — TRIGLYCERIDES: Triglycerides: 241 mg/dL — ABNORMAL HIGH (ref <150)

## 2021-03-14 LAB — BASIC METABOLIC PANEL
Anion gap: 14 (ref 5–15)
BUN: 39 mg/dL — ABNORMAL HIGH (ref 8–23)
CO2: 20 mmol/L — ABNORMAL LOW (ref 22–32)
Calcium: 8.5 mg/dL — ABNORMAL LOW (ref 8.9–10.3)
Chloride: 102 mmol/L (ref 98–111)
Creatinine, Ser: 1.38 mg/dL — ABNORMAL HIGH (ref 0.44–1.00)
GFR, Estimated: 40 mL/min — ABNORMAL LOW (ref >60)
Glucose, Bld: 159 mg/dL — ABNORMAL HIGH (ref 70–99)
Potassium: 3.3 mmol/L — ABNORMAL LOW (ref 3.5–5.1)
Sodium: 136 mmol/L (ref 135–145)

## 2021-03-14 LAB — TROPONIN I (HIGH SENSITIVITY)
Troponin I (High Sensitivity): 1665 ng/L (ref <18)
Troponin I (High Sensitivity): 1463 ng/L (ref <18)

## 2021-03-14 LAB — PHOSPHORUS
Phosphorus: 4.6 mg/dL (ref 2.5–4.6)
Phosphorus: 4 mg/dL (ref 2.5–4.6)

## 2021-03-14 LAB — HEPARIN LEVEL (UNFRACTIONATED)
Heparin Unfractionated: 0.91 IU/mL — ABNORMAL HIGH (ref 0.30–0.70)
Heparin Unfractionated: 0.83 IU/mL — ABNORMAL HIGH (ref 0.30–0.70)

## 2021-03-14 LAB — LACTIC ACID, PLASMA: Lactic Acid, Venous: 2.2 mmol/L (ref 0.5–1.9)

## 2021-03-14 LAB — MRSA NEXT GEN BY PCR, NASAL: MRSA by PCR Next Gen: NOT DETECTED

## 2021-03-14 MED ORDER — PERAMPANEL 2 MG PO TABS
2.0000 mg | ORAL_TABLET | Freq: Every day | ORAL | Status: DC
  Administered 2021-03-14: 2 mg
  Filled 2021-03-14 (×2): qty 1

## 2021-03-14 MED ORDER — HEPARIN (PORCINE) 25000 UT/250ML-% IV SOLN
600.0000 [IU]/h | INTRAVENOUS | Status: DC
  Administered 2021-03-14: 700 [IU]/h via INTRAVENOUS
  Administered 2021-03-15: 600 [IU]/h via INTRAVENOUS
  Filled 2021-03-14 (×2): qty 250

## 2021-03-14 MED ORDER — PHENYTOIN SODIUM 50 MG/ML IJ SOLN
100.0000 mg | Freq: Three times a day (TID) | INTRAMUSCULAR | Status: DC
  Administered 2021-03-14 – 2021-03-16 (×8): 100 mg via INTRAVENOUS
  Filled 2021-03-14 (×10): qty 2

## 2021-03-14 MED ORDER — PROPOFOL BOLUS VIA INFUSION
10.0000 mg | Freq: Once | INTRAVENOUS | Status: AC
  Administered 2021-03-14: 10 mg via INTRAVENOUS
  Filled 2021-03-14: qty 10

## 2021-03-14 MED ORDER — MIDAZOLAM BOLUS VIA INFUSION
10.0000 mg | INTRAVENOUS | Status: DC | PRN
  Administered 2021-03-16: 10 mg via INTRAVENOUS
  Filled 2021-03-14: qty 10

## 2021-03-14 MED ORDER — SODIUM CHLORIDE 0.9 % IV SOLN: INTRAVENOUS | Status: DC

## 2021-03-14 MED ORDER — POTASSIUM CHLORIDE CRYS ER 20 MEQ PO TBCR
40.0000 meq | EXTENDED_RELEASE_TABLET | Freq: Once | ORAL | Status: AC
Start: 2021-03-14 — End: 2021-03-14
  Administered 2021-03-14: 40 meq via ORAL
  Filled 2021-03-14: qty 2

## 2021-03-14 MED ORDER — SODIUM CHLORIDE 0.9 % IV SOLN
0.5000 mg/h | INTRAVENOUS | Status: DC
  Administered 2021-03-14: 60 mg/h via INTRAVENOUS
  Filled 2021-03-14 (×3): qty 20

## 2021-03-14 MED ORDER — MIDAZOLAM BOLUS VIA INFUSION
10.0000 mg | INTRAVENOUS | Status: AC
  Administered 2021-03-14 (×7): 10 mg via INTRAVENOUS
  Filled 2021-03-14 (×9): qty 10

## 2021-03-14 MED ORDER — SODIUM CHLORIDE 0.9 % IV SOLN
0.5000 mg/h | INTRAVENOUS | Status: DC
  Administered 2021-03-15: 50 mg/h via INTRAVENOUS
  Administered 2021-03-15 – 2021-03-16 (×5): 60 mg/h via INTRAVENOUS
  Administered 2021-03-16: 50 mg/h via INTRAVENOUS
  Administered 2021-03-16 (×2): 60 mg/h via INTRAVENOUS
  Filled 2021-03-14 (×7): qty 50
  Filled 2021-03-14: qty 100
  Filled 2021-03-14 (×2): qty 50
  Filled 2021-03-14: qty 100

## 2021-03-14 MED ORDER — PROPOFOL BOLUS VIA INFUSION
10.0000 mg | INTRAVENOUS | Status: AC | PRN
  Administered 2021-03-14 – 2021-03-15 (×3): 10 mg via INTRAVENOUS
  Filled 2021-03-14: qty 10

## 2021-03-14 MED ORDER — VITAL HIGH PROTEIN PO LIQD: 1000.0000 mL | ORAL | Status: DC

## 2021-03-14 MED ORDER — VITAL HIGH PROTEIN PO LIQD
1000.0000 mL | ORAL | Status: DC
  Administered 2021-03-14: 1000 mL

## 2021-03-14 MED ORDER — PROSOURCE TF PO LIQD
45.0000 mL | Freq: Two times a day (BID) | ORAL | Status: DC
  Filled 2021-03-14: qty 45

## 2021-03-14 MED ORDER — ASPIRIN 325 MG PO TABS
325.0000 mg | ORAL_TABLET | Freq: Once | ORAL | Status: AC
  Administered 2021-03-14: 325 mg via NASOGASTRIC
  Filled 2021-03-14: qty 1

## 2021-03-14 MED ORDER — PERAMPANEL 2 MG PO TABS: 2.0000 mg | ORAL_TABLET | Freq: Every day | ORAL | Status: DC

## 2021-03-14 MED ORDER — NOREPINEPHRINE 4 MG/250ML-% IV SOLN
0.0000 ug/min | INTRAVENOUS | Status: DC
  Administered 2021-03-14: 14 ug/min via INTRAVENOUS
  Administered 2021-03-14: 15 ug/min via INTRAVENOUS
  Administered 2021-03-14: 13 ug/min via INTRAVENOUS
  Administered 2021-03-14: 14 ug/min via INTRAVENOUS
  Administered 2021-03-15: 12 ug/min via INTRAVENOUS
  Administered 2021-03-15 (×3): 14 ug/min via INTRAVENOUS
  Administered 2021-03-16 (×3): 13 ug/min via INTRAVENOUS
  Filled 2021-03-14 (×9): qty 250

## 2021-03-14 MED ORDER — MIDAZOLAM HCL (PF) 5 MG/ML IJ SOLN: 10.0000 mg | INTRAMUSCULAR | Status: DC

## 2021-03-14 MED ORDER — MIDAZOLAM 50MG/50ML (1MG/ML) PREMIX INFUSION
0.5000 mg/h | INTRAVENOUS | Status: DC
  Administered 2021-03-14: 0.5 mg/h via INTRAVENOUS
  Administered 2021-03-14: 15 mg/h via INTRAVENOUS
  Administered 2021-03-14: 5 mg/h via INTRAVENOUS
  Administered 2021-03-14: 45 mg/h via INTRAVENOUS
  Filled 2021-03-14 (×5): qty 50

## 2021-03-14 MED ORDER — ASPIRIN 81 MG PO CHEW
81.0000 mg | CHEWABLE_TABLET | Freq: Every day | ORAL | Status: DC
  Administered 2021-03-15 – 2021-03-16 (×2): 81 mg via NASOGASTRIC
  Filled 2021-03-14 (×2): qty 1

## 2021-03-14 MED ORDER — MIDAZOLAM HCL 2 MG/2ML IJ SOLN
0.2000 mg/kg | INTRAMUSCULAR | Status: DC | PRN
  Filled 2021-03-14: qty 10.4

## 2021-03-14 MED ORDER — MAGNESIUM SULFATE 2 GM/50ML IV SOLN
2.0000 g | Freq: Once | INTRAVENOUS | Status: AC
  Administered 2021-03-14: 2 g via INTRAVENOUS
  Filled 2021-03-14: qty 50

## 2021-03-14 MED ORDER — MIDAZOLAM HCL 50 MG/10ML IJ SOLN
0.5000 mg/h | INTRAMUSCULAR | Status: DC
  Administered 2021-03-14 (×2): 60 mg/h via INTRAVENOUS
  Filled 2021-03-14 (×4): qty 20

## 2021-03-14 MED ORDER — SODIUM CHLORIDE 0.9 % IV SOLN
0.5000 mg/h | INTRAVENOUS | Status: DC
  Filled 2021-03-14: qty 50

## 2021-03-14 MED ORDER — PROPOFOL BOLUS VIA INFUSION
10.0000 mg | Freq: Once | INTRAVENOUS | Status: AC
  Administered 2021-03-14: 10 mg via INTRAVENOUS

## 2021-03-14 NOTE — Progress Notes (Signed)
Before giving IV pushes of Propofol pharmacy was called for consultation, and MD remained at the bedside for all ordered administrations given throughout the night.  Neurology MD, originally ordered a one time push of 10mg  of Versed for seizure control, after which the patient became hypotensive with systolic BP's in the 50's-60's. A 500 mL bolus and Levophed infusion was ordered per MD. Neurology MD then switched to the subsequent propofol pushes for seizure management. See [facility] and Neurology note for record of administration.

## 2021-03-14 NOTE — Progress Notes (Deleted)
ANTICOAGULATION CONSULT NOTE  Pharmacy Consult for Heparin Indication: chest pain/ACS  Allergies  Allergen Reactions   Depakote [Valproic Acid] Other (See Comments)    unknown   Haldol [Haloperidol Lactate] Other (See Comments)    Unknown    Penicillins Other (See Comments)    Unknown    Shellfish Allergy Other (See Comments)    unknown   Trileptal [Oxcarbazepine] Other (See Comments)    unknown    Patient Measurements: Height: _0  (170.2 cm) Weight: 52 kg (114 lb 10.2 oz) IBW/kg (Calculated) : 61.6 Heparin Dosing Weight: 52 kg  Vital Signs: Temp: 97.9 F (36.6 C) (07/16 1630) Temp Source: Bladder (07/16 1600) BP: 99/63 (07/16 1630) Pulse Rate: 70 (07/16 1630)  Labs: Recent Labs    03/12/21 1118 03/02/2021 1306 03/10/2021 1926 03/14/21 0122 03/14/21 0447 03/14/21 1600  HGB 14.1 16.1*  --  12.4  --   --   HCT 42.4 47.1*  --  36.8  --   --   PLT 209 197  --  180  --   --   APTT  --  30  --   --   --   --   LABPROT  --  13.3  --   --   --   --   INR  --  1.0  --   --   --   --   HEPARINUNFRC  --   --   --   --   --  0.91*  CREATININE 0.47 1.29*  --  1.38*  --   --   CKTOTAL  --   --  543*  --   --   --   CKMB  --   --  7.8*  --   --   --   TROPONINIHS  --   --   --  1,665* 1,463*  --      Estimated Creatinine Clearance: 29.8 mL/min (A) (by C-G formula based on SCr of 1.38 mg/dL (H)).   Medical History: Past Medical History:  Diagnosis Date   Asthma    CHF (congestive heart failure) (HCC)    Diabetes mellitus without complication (HCC)    Hepatitis A    Hepatitis B    Hepatitis C    Hypertension    Respiratory failure (HCC)    Schizophrenia ([place])    Seizures ([place])    Smoker    1 pack daily    Medications:  Scheduled:   [START ON 03/15/2021] aspirin  81 mg Per NG tube Daily   atorvastatin  40 mg Per NG tube q1800   benztropine  0.5 mg Per Tube BID   chlorhexidine gluconate (MEDLINE KIT)  15 mL Mouth Rinse BID   Chlorhexidine Gluconate Cloth   6 each Topical Daily   [START ON 03/15/2021] feeding supplement (VITAL HIGH PROTEIN)  1,000 mL Per Tube Q24H   fluPHENAZine  5 mg Per Tube BID   insulin aspart  0-9 Units Subcutaneous Q4H   lamoTRIgine  200 mg Per Tube BID   mouth rinse  15 mL Mouth Rinse 10 times per day   pantoprazole sodium  40 mg Per Tube Daily   phenytoin (DILANTIN) IV  100 mg Intravenous Q8H   potassium chloride  40 mEq Oral Once   sodium chloride flush  10-40 mL Intracatheter Q12H   Infusions:   sodium chloride Stopped (03/14/21 1553)   sodium chloride 125 mL/hr at 03/14/21 1600   cefTRIAXone (ROCEPHIN)  IV 200 mL/hr  at 03/14/21 1600   heparin 700 Units/hr (03/14/21 1600)   lacosamide (VIMPAT) IV Stopped (03/14/21 1024)   levETIRAcetam Stopped (03/14/21 1211)   magnesium sulfate bolus IVPB     midazolam 15 mg/hr (03/14/21 1636)   norepinephrine (LEVOPHED) Adult infusion 11 mcg/min (03/14/21 1600)   propofol (DIPRIVAN) infusion 85 mcg/kg/min (03/14/21 1500)    Assessment: 75 y.o. F presents from [facility] for management of SE. Pt now with STEMI - unable to take to cardiac cath at this time. She is now on  heparin per pharmacy.  -initial heparin level = 0.91  Goal of Therapy:  Heparin level 0.3-0.7 units/ml Monitor platelets by anticoagulation protocol: Yes   Plan:  -Decrease heparin to 600 units/hr -Recheck heparin level with CBC in am  Hildred Laser, PharmD Clinical Pharmacist **Pharmacist phone directory can now be found on [email or web address] ([email or web address]).  Listed under [place].

## 2021-03-14 NOTE — Progress Notes (Addendum)
Initial Nutrition Assessment  DOCUMENTATION CODES:   Underweight (Suspect PCM)  INTERVENTION:   Xray shows OG side port located at the GE junction, recommend advancing further into stomach prior to feeding.   Monitor magnesium, potassium, and phosphorus daily for at least 3 days, MD to replete as needed, as pt is at risk for refeeding syndrome  Tube feeding:  -Vital High Protein @ 20 ml/hr via OG -Increase by 10 ml Q6 hours to goal rate of 50 ml/hr (1200 ml)  Provides: 1200 kcals (1902 kcal with propofol), 105 grams protein, 1003 ml free water. Kcal exceed requirement to meet protein needs.   NUTRITION DIAGNOSIS:   Increased nutrient needs related to wound healing as evidenced by estimated needs.  GOAL:   Patient will meet greater than or equal to 90% of their needs  MONITOR:   Vent status, Skin, TF tolerance, Weight trends, Labs, I & O's  REASON FOR ASSESSMENT:   Consult, Ventilator Enteral/tube feeding initiation and management  ASSESSMENT:   Patient with PMH significant for schizophrenia, dementia, CHF, DM, hepatitis, CVA, HTN, HLD, and seizure disorder. Presents this admission status epilepticus with UTI.  Requiring levophed. On continuous EEG and increased propofol. Chest xray shows OG side port located at the GE junction. Would advanced further to avoid aspiration. Will discuss with nursing.   Weight shows large decline from 71.4 kg on 07/03/20 to 52 kg this admission. Unsure if this is true weight loss, fluid fluctuation, or a weighing error. Will need to obtain NFPE to assess for malnutrition.   Patient is currently intubated on ventilator support MV: 7.0 L/min Temp (24hrs), Avg:97.6 F (36.4 C), Min:95.18 F (35.1 C), Max:101.1 F (38.4 C)  Propofol: 26.6 ml/hr- provides 702 kcal from lipids daily    Drips: NS @ 125 ml/hr, levophed, propofol Medications: SS novolog Labs: K 3.3 (L) CBG 183-191  Diet Order:   Diet Order             Diet NPO time  specified  Diet effective now                   EDUCATION NEEDS:   Not appropriate for education at this time  Skin:  Skin Assessment: Skin Integrity Issues: Skin Integrity Issues:: DTI DTI: ischial tuberosity  Last BM:  PTA  Height:   Ht Readings from Last 1 Encounters:  03/14/21 5\' 7"  (1.702 m)    Weight:   Wt Readings from Last 1 Encounters:  03/14/21 52 kg    BMI:  Body mass index is 17.96 kg/m.  Estimated Nutritional Needs:   Kcal:  1300-1560 kcal  Protein:  90-105 grams  Fluid:  1.3 L/day  03/06/2021 MS, RD, LDN, CNSC Clinical Nutrition Pager listed in AMION

## 2021-03-14 NOTE — Procedures (Signed)
Central Venous Catheter Insertion Procedure Note  Procedure: Insertion of Central Venous Catheter  Indications:  vascular access, hypovolemia  Procedure Details  Informed consent could not be obtained for the procedure, tried contacting [name], POA listed on chart but not available.  Line placed due to urgency of situation with multiple non compatible drugs and recurrent hypotension requiring CV access Maximum sterile technique was used including antiseptics, cap, gloves, gown, hand hygiene, mask, and sheet.  Under sterile conditions the skin above the on the left internal jugular vein was prepped with betadine and covered with a sterile drape. Local anesthesia was applied to the skin and subcutaneous tissues. A 22-gauge needle was used to identify the vein. An 18-gauge needle was then inserted into the vein. A guide wire was then passed easily through the catheter. There were no arrhythmias. The catheter was then withdrawn. A 7.5 French triple-lumen was then inserted into the vessel over the guide wire. The catheter was sutured into place.  Findings: There were no changes to vital signs. Catheter was flushed with 10 cc NS. Patient did tolerate procedure well.  Recommendations: CXR ordered to verify placement.

## 2021-03-14 NOTE — Progress Notes (Signed)
Is a brief note to document events of the early morning  Patient was treated through the night in concert with neurology.  She is on continuous EEG and patient received intermittent boluses of propofol through the night with increase in the continuous propofol rate.  Patient was noted to have ST segment elevation EKG showed same in V1 V2 V4 and V5.  Troponin came back 1600.  Patient was evaluated by cardiology who feels that the patient is not a candidate for cardiac catheterization at this time.  Patient remains hemodynamically stable save some degree of hypotension secondary to high-dose propofol with intermittent boluses.  She is on Levophed 8 mics per kilogram per minute.  Over 30 minutes was spent bedside evaluation chart review critical care planning not including placement of central line.

## 2021-03-14 NOTE — Progress Notes (Signed)
  Echocardiogram 2D Echocardiogram has been performed.  Leta Jungling M 03/14/2021, 1:44 PM

## 2021-03-14 NOTE — Progress Notes (Signed)
EEG maintenance complete no skin breakdown seen at electrode site FP1 Fp2 F7. Continue to monitor

## 2021-03-14 NOTE — Progress Notes (Signed)
ANTICOAGULATION CONSULT NOTE - Initial Consult  Pharmacy Consult for Heparin Indication: chest pain/ACS  Allergies  Allergen Reactions   Depakote [Valproic Acid] Other (See Comments)    unknown   Haldol [Haloperidol Lactate] Other (See Comments)    Unknown    Penicillins Other (See Comments)    Unknown    Shellfish Allergy Other (See Comments)    unknown   Trileptal [Oxcarbazepine] Other (See Comments)    unknown    Patient Measurements: Height: _0  (170.2 cm) Weight: 52 kg (114 lb 10.2 oz) IBW/kg (Calculated) : 61.6 Heparin Dosing Weight: 52 kg  Vital Signs: Temp: 96.08 F (35.6 C) (07/16 0345) BP: 111/70 (07/16 0215) Pulse Rate: 62 (07/16 0345)  Labs: Recent Labs    03/12/21 1118 03/22/2021 1306 03/23/2021 1926 03/14/21 0122  HGB 14.1 16.1*  --  12.4  HCT 42.4 47.1*  --  36.8  PLT 209 197  --  180  APTT  --  30  --   --   LABPROT  --  13.3  --   --   INR  --  1.0  --   --   CREATININE 0.47 1.29*  --  1.38*  CKTOTAL  --   --  543*  --   CKMB  --   --  7.8*  --   TROPONINIHS  --   --   --  1,665*    Estimated Creatinine Clearance: 29.8 mL/min (A) (by C-G formula based on SCr of 1.38 mg/dL (H)).   Medical History: Past Medical History:  Diagnosis Date   Asthma    CHF (congestive heart failure) (HCC)    Diabetes mellitus without complication (HCC)    Hepatitis A    Hepatitis B    Hepatitis C    Hypertension    Respiratory failure (HCC)    Schizophrenia ([place])    Seizures ([place])    Smoker    1 pack daily    Medications:  Scheduled:   [START ON 03/15/2021] aspirin  81 mg Per NG tube Daily   aspirin  325 mg Per NG tube Once   atorvastatin  40 mg Per NG tube q1800   benztropine  0.5 mg Per Tube BID   chlorhexidine gluconate (MEDLINE KIT)  15 mL Mouth Rinse BID   Chlorhexidine Gluconate Cloth  6 each Topical Daily   fluPHENAZine  5 mg Per Tube BID   insulin aspart  0-9 Units Subcutaneous Q4H   lamoTRIgine  200 mg Per Tube BID   mouth rinse  15 mL  Mouth Rinse 10 times per day   pantoprazole sodium  40 mg Per Tube Daily   phenytoin (DILANTIN) IV  100 mg Intravenous Q8H   sodium chloride flush  10-40 mL Intracatheter Q12H   Infusions:   sodium chloride 250 mL (03/14/21 0303)   sodium chloride 125 mL/hr at 03/14/21 0200   cefTRIAXone (ROCEPHIN)  IV     heparin     lacosamide (VIMPAT) IV 200 mg (03/14/21 0013)   levETIRAcetam 1,000 mg (03/08/2021 2256)   norepinephrine (LEVOPHED) Adult infusion 5 mcg/min (03/23/2021 2300)   propofol (DIPRIVAN) infusion 75 mcg/kg/min (03/14/21 0527)    Assessment: 75 y.o. F presents from [facility] for management of SE. Pt now with STEMI - unable to take to cardiac cath at this time. To begin heparin per pharmacy. Pt on SQ heparin for VTE prophylaxis - last dose given ~0530. CBC stable.  Goal of Therapy:  Heparin level 0.3-0.7 units/ml Monitor  platelets by anticoagulation protocol: Yes   Plan:  D/C SQ heparin Heparin gtt at 700 units/hr. No bolus with SQ heparin just given. Will f/u heparin level in 8 hours Daily heparin level and CBC  Sherlon Handing, PharmD, BCPS Please see amion for complete clinical pharmacist phone list 03/14/2021,5:35 AM

## 2021-03-14 NOTE — Progress Notes (Addendum)
Continues to exhibit left sided electrographic seizure activity on LTM EEG.   Bolusing additional 10 mg propofol. Increasing gtt to 55 mcg/kg/min.   Addendum:  - Continued left sided electrographic seizure activity on LTM EEG, but improved since last increase of propofol - Increasing propofol gtt to 65 after 10 mg bolus  Addendum (4:23 AM):  - Continued left sided electrographic seizure activity on LTM EEG - Increasing propofol gtt to 75 after 10 mg bolus  Addendum (6:15 AM): - Intermittent bursts of left sided electrographic seizure activity on LTM EEG - Increasing propofol gtt to 85 after 10 mg bolus.  30 total minutes of critical care time.  Electronically signed: Dr. Caryl Pina

## 2021-03-14 NOTE — Progress Notes (Signed)
Cardiology Progress Note  Patient ID: ALASHA MCGUINNESS MRN: [id number] DOB: 1946/07/29 Date of Encounter: 03/14/2021  Primary Cardiologist: None  Subjective   Chief Complaint: None.  Intubated.  HPI: Intubated and sedated on vent.  Transferred for status epilepticus.  Troponin elevation minimal and flat.  EKG with concerns for ST elevation in V1 V2.  ROS:  All other ROS reviewed and negative. Pertinent positives noted in the HPI.     Inpatient Medications  Scheduled Meds:  [START ON 03/15/2021] aspirin  81 mg Per NG tube Daily   atorvastatin  40 mg Per NG tube q1800   benztropine  0.5 mg Per Tube BID   chlorhexidine gluconate (MEDLINE KIT)  15 mL Mouth Rinse BID   Chlorhexidine Gluconate Cloth  6 each Topical Daily   feeding supplement (PROSource TF)  45 mL Per Tube BID   feeding supplement (VITAL HIGH PROTEIN)  1,000 mL Per Tube Q24H   fluPHENAZine  5 mg Per Tube BID   insulin aspart  0-9 Units Subcutaneous Q4H   lamoTRIgine  200 mg Per Tube BID   mouth rinse  15 mL Mouth Rinse 10 times per day   midazolam  10 mg Intravenous Q5 min   pantoprazole sodium  40 mg Per Tube Daily   phenytoin (DILANTIN) IV  100 mg Intravenous Q8H   sodium chloride flush  10-40 mL Intracatheter Q12H   Continuous Infusions:  sodium chloride 10 mL/hr at 03/14/21 0800   sodium chloride 125 mL/hr at 03/14/21 0922   cefTRIAXone (ROCEPHIN)  IV     heparin 700 Units/hr (03/14/21 0800)   lacosamide (VIMPAT) IV 200 mg (03/14/21 0954)   levETIRAcetam Stopped (03/20/2021 2312)   midazolam 0.5 mg/hr (03/14/21 0944)   norepinephrine (LEVOPHED) Adult infusion 15 mcg/min (03/14/21 0924)   propofol (DIPRIVAN) infusion 85 mcg/kg/min (03/14/21 0938)   PRN Meds: docusate, fentaNYL (SUBLIMAZE) injection, fentaNYL (SUBLIMAZE) injection, polyethylene glycol, propofol, sodium chloride flush   Vital Signs   Vitals:   03/14/21 0730 03/14/21 0745 03/14/21 0800 03/14/21 0900  BP: (!) 76/60 (!) 78/60 (!) 78/61    Pulse: 95 98 98 95  Resp: '16 16 16 16  ' Temp: (!) 96.6 F (35.9 C) (!) 97 F (36.1 C) (!) 97.2 F (36.2 C) 98.42 F (36.9 C)  TempSrc:   Bladder   SpO2: 99% 99% 98%   Weight:      Height:        Intake/Output Summary (Last 24 hours) at 03/14/2021 1047 Last data filed at 03/14/2021 0800 Gross per 24 hour  Intake 1381.68 ml  Output --  Net 1381.68 ml   Last 3 Weights 03/14/2021 03/01/2021 03/05/2021  Weight (lbs) 114 lb 10.2 oz 115 lb 115 lb  Weight (kg) 52 kg 52.164 kg 52.164 kg      Telemetry  Overnight telemetry shows sinus rhythm in the 90s, which I personally reviewed.   ECG  The most recent ECG shows sinus rhythm heart rate 64, ST elevation noted in V1 and V2.  No reciprocal ST depressions, which I personally reviewed.   Physical Exam   Vitals:   03/14/21 0730 03/14/21 0745 03/14/21 0800 03/14/21 0900  BP: (!) 76/60 (!) 78/60 (!) 78/61   Pulse: 95 98 98 95  Resp: '16 16 16 16  ' Temp: (!) 96.6 F (35.9 C) (!) 97 F (36.1 C) (!) 97.2 F (36.2 C) 98.42 F (36.9 C)  TempSrc:   Bladder   SpO2: 99% 99% 98%   Weight:  Height:        Intake/Output Summary (Last 24 hours) at 03/14/2021 1047 Last data filed at 03/14/2021 0800 Gross per 24 hour  Intake 1381.68 ml  Output --  Net 1381.68 ml    Last 3 Weights 03/14/2021 03/23/2021 03/10/2021  Weight (lbs) 114 lb 10.2 oz 115 lb 115 lb  Weight (kg) 52 kg 52.164 kg 52.164 kg    Body mass index is 17.96 kg/m.  General: Intubated and sedated on vent Head: Atraumatic, normal size  Eyes: PEERLA, EOMI  Neck: Supple, no JVD Endocrine: No thryomegaly Cardiac: Normal S1, S2; RRR; no murmurs, rubs, or gallops Lungs: Diminished breath sounds bilaterally Abd: Soft, nontender, no hepatomegaly  Ext: No edema, pulses 2+ Musculoskeletal: No deformities Skin: Warm and dry, no rashes   Neuro: Intubated and sedated on vent  Labs  High Sensitivity Troponin:   Recent Labs  Lab 03/14/21 0122 03/14/21 0447  TROPONINIHS 1,665*  1,463*     Cardiac EnzymesNo results for input(s): TROPONINI in the last 168 hours. No results for input(s): TROPIPOC in the last 168 hours.  Chemistry Recent Labs  Lab 03/12/21 1118 03/02/2021 1306 03/14/21 0122  NA 138 139 136  K 4.3 3.9 3.3*  CL 100 100 102  CO2 26 24 20*  GLUCOSE 141* 166* 159*  BUN 10 34* 39*  CREATININE 0.47 1.29* 1.38*  CALCIUM 9.6 9.6 8.5*  PROT 7.4 7.9  --   ALBUMIN 4.1 4.3  --   AST 37 43*  --   ALT 26 27  --   ALKPHOS 101 105  --   BILITOT 0.9 1.0  --   GFRNONAA >60 44* 40*  ANIONGAP '12 15 14    ' Hematology Recent Labs  Lab 03/12/21 1118 03/03/2021 1306 03/14/21 0122  WBC 5.9 13.7* 14.8*  RBC 4.32 4.87 3.83*  HGB 14.1 16.1* 12.4  HCT 42.4 47.1* 36.8  MCV 98.1 96.7 96.1  MCH 32.6 33.1 32.4  MCHC 33.3 34.2 33.7  RDW 12.8 13.2 13.2  PLT 209 197 180   BNPNo results for input(s): BNP, PROBNP in the last 168 hours.  DDimer No results for input(s): DDIMER in the last 168 hours.   Radiology  CT Head Wo Contrast  Result Date: 03/20/2021 CLINICAL DATA:  Mental status changes EXAM: CT HEAD WITHOUT CONTRAST TECHNIQUE: Contiguous axial images were obtained from the base of the skull through the vertex without intravenous contrast. COMPARISON:  03/12/2021 FINDINGS: Brain: There is atrophy and chronic small vessel disease changes. Old right basal ganglia lacunar infarct. No acute intracranial abnormality. Specifically, no hemorrhage, hydrocephalus, mass lesion, acute infarction, or significant intracranial injury. Vascular: No hyperdense vessel or unexpected calcification. Skull: No acute calvarial abnormality. Sinuses/Orbits: Air-fluid level in the right maxillary sinus. Other: None IMPRESSION: No significant change since prior study.  No acute findings. Electronically Signed   By: Rolm Baptise M.D.   On: 03/09/2021 16:06   CT Head Wo Contrast  Result Date: 03/12/2021 CLINICAL DATA:  Seizure EXAM: CT HEAD WITHOUT CONTRAST TECHNIQUE: Contiguous axial images  were obtained from the base of the skull through the vertex without intravenous contrast. COMPARISON:  12/25/2020 FINDINGS: Brain: There is atrophy and chronic small vessel disease changes. 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 No acute intracranial abnormality. Specifically, no hemorrhage, hydrocephalus, mass lesion, acute infarction, or significant intracranial injury. Vascular: No hyperdense vessel or unexpected calcification. Skull: No acute calvarial abnormality. Sinuses/Orbits: Visualized paranasal sinuses and mastoids clear. Orbital soft tissues unremarkable. Other: None IMPRESSION: Old  right basal ganglia lacunar infarcts. Atrophy, chronic microvascular disease. No acute intracranial abnormality. Electronically Signed   By: Rolm Baptise M.D.   On: 03/12/2021 15:23   DG CHEST PORT 1 VIEW  Result Date: 03/14/2021 CLINICAL DATA:  Respiratory failure EXAM: PORTABLE CHEST 1 VIEW COMPARISON:  03/09/2021 FINDINGS: Endotracheal tube terminates 4.1 cm above the carina. Left IJ venous catheter terminates in the mid SVC. Mild right basilar atelectasis. Left lung is clear. No pleural effusion or pneumothorax. Enteric tube courses into the proximal stomach with its side port at the GE junction. IMPRESSION: Endotracheal tube terminates 4.1 cm above the carina. Left IJ venous catheter terminates in the mid SVC. Electronically Signed   By: Julian Hy M.D.   On: 03/14/2021 02:43   DG Chest Port 1 View  Result Date: 03/12/2021 CLINICAL DATA:  Acute respiratory failure with hypoxia EXAM: PORTABLE CHEST 1 VIEW COMPARISON:  02/28/2021 at 1610 hours FINDINGS: Minimal bibasilar atelectasis.  No pleural effusion or pneumothorax. The heart is normal in size. Endotracheal tube terminates 7 mm above the carina. Enteric tube courses into the proximal stomach with its side port at the GE junction. IMPRESSION: Minimal bibasilar atelectasis. Support apparatus as above. Electronically Signed   By: Julian Hy M.D.    On: 03/17/2021 19:20   DG Chest Portable 1 View  Result Date: 03/26/2021 CLINICAL DATA:  Post intubation, OG tube placement EXAM: PORTABLE CHEST 1 VIEW COMPARISON:  03/26/2021 FINDINGS: Endotracheal tube is 1.2 cm above the carina. NG tube is in the stomach. Minimal right base atelectasis. Left lung clear. Heart is normal size. No effusions or acute bony abnormality. IMPRESSION: Minimal right base atelectasis. Electronically Signed   By: Rolm Baptise M.D.   On: 03/15/2021 16:18   DG Chest Port 1 View  Result Date: 03/14/2021 CLINICAL DATA:  Sepsis.  Altered mental status.  Fever. EXAM: PORTABLE CHEST 1 VIEW COMPARISON:  09/26/2020 FINDINGS: The patient is rotated to the right on today's radiograph, reducing diagnostic sensitivity and specificity. Atherosclerotic calcification of the aortic arch. Heart size within normal limits. No blunting of the costophrenic angles. Mild degenerative glenohumeral arthropathy on the left. Linear subsegmental atelectasis or scarring at the right lung base. IMPRESSION: 1. Subsegmental atelectasis at the right lung base; no compelling findings for active pneumonia. 2.  Aortic Atherosclerosis (ICD10-I70.0). Electronically Signed   By: Van Clines M.D.   On: 03/28/2021 13:42   CT Renal Stone Study  Result Date: 03/11/2021 CLINICAL DATA:  Sepsis, urinary tract infections EXAM: CT ABDOMEN AND PELVIS WITHOUT CONTRAST TECHNIQUE: Multidetector CT imaging of the abdomen and pelvis was performed following the standard protocol without IV contrast. COMPARISON:  02/14/2013 FINDINGS: Lower chest: Mild scarring or atelectasis in the right lower lobe. Circumflex and right coronary artery and descending thoracic aortic atherosclerotic calcification. Hepatobiliary: Unremarkable Pancreas: Unremarkable Spleen: Unremarkable Adrenals/Urinary Tract: Low-density fullness of both adrenal glands without mass identified. Vascular calcification along the left renal hilum, image 74 series 5.  Punctate 1-2 mm right kidney upper pole calcification on image 84 series 5, probably a renal calculus. Borderline right hydronephrosis without ureteral calculus or specific cause identified. Stomach/Bowel: Descending and sigmoid colon diverticulosis without findings of active diverticulitis. Circumferential wall thickening in the lower rectum extending to the anorectal junction for example on image 77 series 2, cannot exclude inflammation or tumor, correlate with digital rectal exam findings. Vascular/Lymphatic: Aortoiliac atherosclerotic vascular disease. Reproductive: Posterior uterine body fibroid. Other: Cutaneous and subcutaneous edema posterolateral to the right hip on image 87  series 2. Musculoskeletal: Severe bilateral hip arthropathy with protrusio. Lumbar spondylosis and degenerative disc disease causing multilevel impingement. Mild grade 1 degenerative retrolisthesis at L2-3. Small indirect right inguinal hernia contains adipose tissue. IMPRESSION: 1. There borderline right hydronephrosis without ureteral calculus or visible cause. 2. Punctate 1-2 mm right kidney upper pole nonobstructive renal calculus. 3. Wall thickening in the lower rectum and anorectal junction, inflammation or tumor not excluded. 4.  Aortic Atherosclerosis (ICD10-I70.0).  Coronary atherosclerosis. 5. Descending and sigmoid colon diverticulosis. 6. Uterine fibroid. 7. Cutaneous and subcutaneous edema overlying the right hip, cause uncertain. 8. Severe bilateral hip arthropathy with protrusio. 9. Multilevel lumbar impingement. Electronically Signed   By: Van Clines M.D.   On: 03/24/2021 14:25    Cardiac Studies  Echo pending  Patient Profile  [name] is a 75 y.o. female with dementia, schizophrenia, seizure disorder who was admitted on 03/17/2021 with altered mental status, fever, tachycardia and status epilepticus.  Cardiology was consulted overnight on 03/14/2021 for ST elevation on her EKG.  Assessment & Plan    Elevated troponin/STEMI versus non-STEMI -Admitted to the ICU and is currently intubated for respiratory failure secondary to ongoing seizures.  There is also concern for UTI. -She is critically ill on multiple pressors.  She is currently on propofol due to increased seizure-like activity. -EKG 03/14/2021 around 1:20 AM demonstrated some ST elevation in V1 and V2.  She has no reciprocal ST depressions.  Initial troponin 1665 and is trending down with 1463. -She remains unstable but I believe this is not related to an ischemic cardiomyopathy.  She does have an elevated creatinine. -To me this does not appear to represent an acute ST elevation myocardial infarction.  Review of prior EKG shows she had minimal ST elevation in V1 and V2.  She may just be having more pronounced changes in the setting of critical illness. -Regardless she is not a candidate for cardiac catheterization.  Would recommend continue aspirin, statin, heparin drip for now.  We will treat her medically. -Hold beta-blocker due to ongoing hypotension and need for pressors. -We will obtain echocardiogram.  Cardiology will follow along. -It is reassuring her troponins are trending down.  I think this goes against significant myocardial injury.   CRITICAL CARE Performed by: Lake Bells T O'Neal  Total critical care time: 45 minutes. Critical care time was exclusive of separately billable procedures and treating other patients. Critical care was necessary to treat or prevent imminent or life-threatening deterioration. Critical care was time spent personally by me on the following activities: development of treatment plan with patient and/or surrogate as well as nursing, discussions with consultants, evaluation of patient's response to treatment, examination of patient, obtaining history from patient or surrogate, ordering and performing treatments and interventions, ordering and review of laboratory studies, ordering and review of  radiographic studies, pulse oximetry and re-evaluation of patient's condition.  For questions or updates, please contact [place] Please consult [email or web address] for contact info under     Signed, Lake Bells T. Audie Box, MD, [place]  03/14/2021 10:47 AM

## 2021-03-14 NOTE — Progress Notes (Signed)
NAME:  [name], MRN:  [id number], DOB:  Oct 12, 1945, LOS: 1 ADMISSION DATE:  03/27/2021, CONSULTATION DATE:  7/15 REFERRING MD:  Katrinka Blazing, CHIEF COMPLAINT:  vent management and critical care in setting of status epilepticus   History of Present Illness:  75 year old female patient who resides at a skilled nursing facility due to cognitive impairments as a consequences of schizophrenia, and dementia.  It is unclear what her functional status is otherwise.  Initially brought to the emergency room on 7/14 after having a witnessed single episode of seizure found to have a urinary tract infection, no further seizure witnessed, sent back to skilled nursing facility on Keflex...  Apparently continued to be in encephalopathic after returning to nursing home, had fever of 101.6, as well as  new tachycardia and was sent back to the emergency room for evaluation.  Was to be admitted with a working diagnosis of urinary tract sepsis and treated with antibiotics but subsequently had witnessed seizure while in the emergency room this consisted primarily of right gaze deviation, right face and neck arm twitching, followed by worsening mental status.  She was administered benzodiazepines, IV Keppra, and subsequently intubated for airway protection and transferred to [facility] for neuro critical care services and continuous EEG monitoring.  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  Hypertension, asthma.  Prior stroke.  Hyperlipidemia.  Resides at skilled nursing facility. Significant Hospital Events: Including procedures, antibiotic start and stop dates in addition to other pertinent events   7/14: Seen in emergency room for seizure, diagnosed with urinary tract infection, sent back to nursing home on Keflex 7/15: Persistent encephalopathy, new fever and  tachycardia presented back to the emergency room with working diagnosis of of urosepsis but subsequently had several witnessed seizures, and loss of airway protection requiring intubation transferred from [place] [facility] to [facility] for neuro critical care services and continuous EEG monitoring 7/16 Overnight Cardiology consulted for concern for STEMI. Interventional cardiology reviewed case and cath deferred d/t clinical instability Interim History / Subjective:  Overnight she continued to have left-sided seizure activity on EEG despite propofol increase. Tmax 101.1  Started on levophed for sedation related hypotension vs sepsis  Cardiology also consulted for concern for STEMI. Interventional cardiology reviewed case and cath deferred d/t clinical instability  Objective   Blood pressure 93/61, pulse 69, temperature 97.88 F (36.6 C), temperature source Bladder, resp. rate 16, height 5\' 7"  (1.702 m), weight 52 kg, SpO2 99 %.    Vent Mode: PRVC FiO2 (%):  [40 %-100 %] 40 % Set Rate:  [16 bmp] 16 bmp Vt Set:  [490 mL] 490 mL PEEP:  [5 cmH20] 5 cmH20 Plateau Pressure:  [15 cmH20-18 cmH20] 18 cmH20   Intake/Output Summary (Last 24 hours) at 03/14/2021 1630 Last data filed at 03/14/2021 1600 Gross per 24 hour  Intake 3570.5 ml  Output 115 ml  Net 3455.5 ml   Filed Weights   03/14/21 0500  Weight: 52 kg   Physical Exam: General: Chronically ill-appearing, no acute distress HENT: [place], AT, ETT in place, EEG leads in place Eyes: EOMI, no scleral icterus Respiratory: Diminished breath sounds bilaterally.  No crackles, wheezing or rales Cardiovascular: RRR, -M/R/G, no JVD GI: BS+, soft, nontender Extremities:-Edema,-tenderness Neuro: Unresponsive  Trop peak 1665 CK 543 LA 2.7 WBC 14.8 Phenytoin leve <2.5 subtherapeutic UA +leuk, nitrites  Resolved Hospital Problem list  Assessment & Plan:  Status epilepticus.  Has history of known seizure disorder.  On 73 anticonvulsants  currently.  Suspect this is breakthrough from acute urinary tract infection, also has fever both of which could decrease seizure threshold. Phenytoin found subtherapeutic  Acute metabolic encephalopathy secondary to seizure Superimposed on history of underlying dementia and schizophrenia.  Unclear what her baseline functional and cognitive status is by chart review Plan Appreciate Neuro involvement. AEDs per consult team On versed and propofol gtt Continuous EEG monitoring Seizure precautions As needed benzodiazepines for breakthrough seizure Antibiotics as noted below  Acute hypoxic respiratory failure in the setting of ineffective airway protection and possible aspiration. Plan Full ventilatory support Wean to minimal vent settings. Extubation precluded by mental status VAP bundle  Severe sepsis/septic shock as evidenced by lactic acidosis in the setting of urinary tract infection  Cultures have been sent Plan Continue IV ceftriaxone Follow-up cultures IV hydration, following IV bolus Trend  lactate  Lactic acidosis.  Suspect this is multifactorial: Seizure, and possible sepsis. Plan Maintenance IVF Treat sepsis Treat seizures Trend LA  Acute kidney injury: Secondary to shock Plan Volume resuscitation Monitor BMET/UOP Holding all antihypertensives Renal dose medication Strict intake output  NSTEMI Reviewed EKG with cardiology today. Minimal ST elevation in V1 and V2 with no reciprocal changes. Prior EKGs demonstrated more pronounced changes in setting of critical illness. Echo also reassuring with normal EF and no WMA or significant valvular disease -Heparin gtt x 48 hours -ASA, statin -Appreciate Cardiology input  Hypokalemia Hypomag Plan Replete  Diabetes with hyperglycemia Plan Sliding scale insulin  Mild anemia w/out evidence of bleeding Plan  Trend cbc  Best Practice (right click and "Reselect all SmartList Selections" daily)   Diet/type:  tubefeeds DVT prophylaxis: prophylactic heparin  GI prophylaxis: N/A Lines: Central line Foley:  N/A Code Status:  full code Last date of multidisciplinary goals of care discussion [pending ] - left VM with guardian on 7/16 with callback number  Labs   CBC: Recent Labs  Lab 03/12/21 1118 03-15-2021 1306 03/14/21 0122  WBC 5.9 13.7* 14.8*  NEUTROABS  --  8.5*  --   HGB 14.1 16.1* 12.4  HCT 42.4 47.1* 36.8  MCV 98.1 96.7 96.1  PLT 209 197 180    Basic Metabolic Panel: Recent Labs  Lab 03/12/21 1118 03-15-2021 1306 03/14/21 0122 03/14/21 1033  NA 138 139 136  --   K 4.3 3.9 3.3*  --   CL 100 100 102  --   CO2 26 24 20*  --   GLUCOSE 141* 166* 159*  --   BUN 10 34* 39*  --   CREATININE 0.47 1.29* 1.38*  --   CALCIUM 9.6 9.6 8.5*  --   MG  --   --  1.7 1.5*  PHOS  --   --   --  4.6   GFR: Estimated Creatinine Clearance: 29.8 mL/min (A) (by C-G formula based on SCr of 1.38 mg/dL (H)). Recent Labs  Lab 03/12/21 1118 03/15/21 1306 Mar 15, 2021 1513 15-Mar-2021 2213 03/14/21 0122  WBC 5.9 13.7*  --   --  14.8*  LATICACIDVEN  --  1.8 3.5* 2.7*  --     Liver Function Tests: Recent Labs  Lab 03/12/21 1118 2021/03/15 1306  AST 37 43*  ALT 26 27  ALKPHOS 101 105  BILITOT 0.9 1.0  PROT 7.4 7.9  ALBUMIN 4.1 4.3   No results for input(s): LIPASE, AMYLASE in the last 168 hours. Recent Labs  Lab  03/02/2021 2213  AMMONIA 21    ABG    Component Value Date/Time   PHART 7.44 03/20/2021 1534   PCO2ART 32 03/12/2021 1534   PO2ART 111 (H) 03/18/2021 1534   HCO3 21.7 03/06/2021 1534   TCO2 21 07/11/2013 1157   ACIDBASEDEF 1.5 03/06/2021 1534   O2SAT 98.5 03/19/2021 1534     Coagulation Profile: Recent Labs  Lab 03/08/2021 1306  INR 1.0    Cardiac Enzymes: Recent Labs  Lab 03/01/2021 1926  CKTOTAL 543*  CKMB 7.8*    HbA1C: Hemoglobin A1C  Date/Time Value Ref Range Status  02/14/2013 01:40 AM 6.7 (H) 4.2 - 6.3 % Final    Comment:    The American Diabetes  Association recommends that a primary goal of therapy should be <7% and that physicians should reevaluate the treatment regimen in patients with HbA1c values consistently >8%.    Hgb A1c MFr Bld  Date/Time Value Ref Range Status  03/20/2021 07:32 PM 6.6 (H) 4.8 - 5.6 % Final    Comment:    (NOTE) Pre diabetes:          5.7%-6.4%  Diabetes:              >6.4%  Glycemic control for   <7.0% adults with diabetes   09/26/2019 12:11 PM 6.6 (H) 4.8 - 5.6 % Final    Comment:    (NOTE) Pre diabetes:          5.7%-6.4% Diabetes:              >6.4% Glycemic control for   <7.0% adults with diabetes     CBG: Recent Labs  Lab 03/14/21 0022 03/14/21 0325 03/14/21 0741 03/14/21 1151 03/14/21 1605  GLUCAP 114* 183* 191* 189* 204*    Critical care time: 50 minutes     The patient is critically ill with multiple organ systems failure and requires high complexity decision making for assessment and support, frequent evaluation and titration of therapies, application of advanced monitoring technologies and extensive interpretation of multiple databases.  Independent Critical Care Time: 50 Minutes.   Mechele Collin, M.D. [facility] Pulmonary/Critical Care Medicine 03/14/2021 4:30 PM   Please see Amion for pager number to reach on-call Pulmonary and Critical Care Team.

## 2021-03-14 NOTE — Consult Note (Signed)
Cardiology Consultation:   Patient ID: [name] MRN: [id number]; DOB: [birth year]  Admit date: 03/21/2021 Date of Consult: 03/14/2021  PCP:  Idelle Crouch, MD   [facility] Providers Cardiologist:  None        Patient Profile:   [name] is a 75 y.o. female with a hx of seizures, dementia/schizophrenia possibly who is being seen 03/14/2021 for the evaluation of ACS at the request of Dr. Loanne Drilling  History of Present Illness:   [name] is a 75 y.o. female with a hx of seizures, dementia/schizophrenia possibly who is being seen 03/14/2021 for the evaluation of ACS at the request of Dr. Loanne Drilling. She lives in a SNF and was transferred from [facility] for management of status epilepticus. She remains in status epilepticus on multiple medications for seizures. She is on continuous EEG monitoring, and is on propofol along with multiple antiseizure medications. Also she is intubated. Cardiology was consulted for ACS considering EKG changes in V1-V2 concerning for septal infarction and elevated troponin. Blood pressures were stable on 8 of levophed. No prior cardiac history as such. Family has not been able to be reached, and her baseline neurological status remains unclear.    Past Medical History:  Diagnosis Date   Asthma    CHF (congestive heart failure) (HCC)    Diabetes mellitus without complication ([place])    Hepatitis A    Hepatitis B    Hepatitis C    Hypertension    Respiratory failure (HCC)    Schizophrenia (HCC)    Seizures ([place])    Smoker    1 pack daily      Inpatient Medications: Scheduled Meds:  aspirin  81 mg Per NG tube Daily   atorvastatin  40 mg Per NG tube q1800   benztropine  0.5 mg Per Tube BID   chlorhexidine gluconate (MEDLINE KIT)  15 mL Mouth Rinse BID   Chlorhexidine Gluconate Cloth  6 each Topical Daily   fluPHENAZine  5 mg Per Tube BID   heparin  5,000 Units Subcutaneous Q8H   insulin aspart  0-9 Units Subcutaneous Q4H   lamoTRIgine  200  mg Per Tube BID   mouth rinse  15 mL Mouth Rinse 10 times per day   pantoprazole sodium  40 mg Per Tube Daily   phenytoin (DILANTIN) IV  100 mg Intravenous Q8H   sodium chloride flush  10-40 mL Intracatheter Q12H   Continuous Infusions:  sodium chloride 250 mL (03/14/21 0303)   sodium chloride 125 mL/hr at 03/14/21 0200   cefTRIAXone (ROCEPHIN)  IV     lacosamide (VIMPAT) IV 200 mg (03/14/21 0013)   levETIRAcetam 1,000 mg (03/29/2021 2256)   norepinephrine (LEVOPHED) Adult infusion 5 mcg/min (03/27/2021 2300)   propofol (DIPRIVAN) infusion 55 mcg/kg/min (03/14/21 0245)   PRN Meds: docusate, fentaNYL (SUBLIMAZE) injection, fentaNYL (SUBLIMAZE) injection, midazolam, polyethylene glycol, propofol, sodium chloride flush  Allergies:    Allergies  Allergen Reactions   Depakote [Valproic Acid] Other (See Comments)    unknown   Haldol [Haloperidol Lactate] Other (See Comments)    Unknown    Penicillins Other (See Comments)    Unknown    Shellfish Allergy Other (See Comments)    unknown   Trileptal [Oxcarbazepine] Other (See Comments)    unknown    Social History:   Social History   Socioeconomic History   Marital status: Single    Spouse name: Not on file   Number of children: Not on file  Years of education: Not on file   Highest education level: Not on file  Occupational History   Not on file  Tobacco Use   Smoking status: Every Day    Packs/day: 1.00    Years: 50.00    Pack years: 50.00    Types: Cigarettes   Smokeless tobacco: Never  Substance and Sexual Activity   Alcohol use: Not on file   Drug use: Not on file   Sexual activity: Not on file  Other Topics Concern   Not on file  Social History Narrative   Not on file   Social Determinants of Health   Financial Resource Strain: Not on file  Food Insecurity: Not on file  Transportation Needs: Not on file  Physical Activity: Not on file  Stress: Not on file  Social Connections: Not on file  Intimate  Partner Violence: Not on file    Family History:  Unknown  ROS:  Please see the history of present illness.  All other ROS reviewed and negative.     Physical Exam/Data:   Vitals:   03/14/21 0145 03/14/21 0200 03/14/21 0215 03/14/21 0345  BP: 111/74 110/73 111/70   Pulse: 74 71 67 62  Resp: '16 16 16 16  ' Temp: (!) 97.52 F (36.4 C) (!) 97.52 F (36.4 C) (!) 97.34 F (36.3 C) (!) 96.08 F (35.6 C)  SpO2: 100% 100% 100% 100%    Intake/Output Summary (Last 24 hours) at 03/14/2021 0459 Last data filed at 03/14/2021 0045 Gross per 24 hour  Intake 10 ml  Output --  Net 10 ml   Last 3 Weights 03/11/2021 03/17/2021 12/25/2020  Weight (lbs) 115 lb 115 lb 80 lb 0.4 oz  Weight (kg) 52.164 kg 52.164 kg 36.3 kg     There is no height or weight on file to calculate BMI.  General:  intubated and sedated Cardiac:  normal S1, S2; RRR; no murmur  Lungs:  mechanical breath sounds Abd: soft, nontender, no hepatomegaly  Ext: no edema Musculoskeletal:  No deformities, BUE and BLE strength normal and equal Skin: warm and dry  Psych:  Intubated and sedated  EKG:  The EKG was personally reviewed and demonstrates ST elevation in V1 and V2.   Relevant CV Studies: Echo in 2018 which was largely normal.   Laboratory Data:  High Sensitivity Troponin:   Recent Labs  Lab 03/14/21 0122  TROPONINIHS 1,665*     Chemistry Recent Labs  Lab 03/12/21 1118 03/05/2021 1306 03/14/21 0122  NA 138 139 136  K 4.3 3.9 3.3*  CL 100 100 102  CO2 26 24 20*  GLUCOSE 141* 166* 159*  BUN 10 34* 39*  CREATININE 0.47 1.29* 1.38*  CALCIUM 9.6 9.6 8.5*  GFRNONAA >60 44* 40*  ANIONGAP '12 15 14    ' Recent Labs  Lab 03/12/21 1118 03/29/2021 1306  PROT 7.4 7.9  ALBUMIN 4.1 4.3  AST 37 43*  ALT 26 27  ALKPHOS 101 105  BILITOT 0.9 1.0   Hematology Recent Labs  Lab 03/12/21 1118 03/05/2021 1306 03/14/21 0122  WBC 5.9 13.7* 14.8*  RBC 4.32 4.87 3.83*  HGB 14.1 16.1* 12.4  HCT 42.4 47.1* 36.8  MCV  98.1 96.7 96.1  MCH 32.6 33.1 32.4  MCHC 33.3 34.2 33.7  RDW 12.8 13.2 13.2  PLT 209 197 180   BNPNo results for input(s): BNP, PROBNP in the last 168 hours.  DDimer No results for input(s): DDIMER in the last 168 hours.   Radiology/Studies:  CT Head Wo Contrast  Result Date: 03/17/2021 CLINICAL DATA:  Mental status changes EXAM: CT HEAD WITHOUT CONTRAST TECHNIQUE: Contiguous axial images were obtained from the base of the skull through the vertex without intravenous contrast. COMPARISON:  03/12/2021 FINDINGS: Brain: There is atrophy and chronic small vessel disease changes. Old right basal ganglia lacunar infarct. No acute intracranial abnormality. Specifically, no hemorrhage, hydrocephalus, mass lesion, acute infarction, or significant intracranial injury. Vascular: No hyperdense vessel or unexpected calcification. Skull: No acute calvarial abnormality. Sinuses/Orbits: Air-fluid level in the right maxillary sinus. Other: None IMPRESSION: No significant change since prior study.  No acute findings. Electronically Signed   By: Rolm Baptise M.D.   On: 03/09/2021 16:06   CT Head Wo Contrast  Result Date: 03/12/2021 CLINICAL DATA:  Seizure EXAM: CT HEAD WITHOUT CONTRAST TECHNIQUE: Contiguous axial images were obtained from the base of the skull through the vertex without intravenous contrast. COMPARISON:  12/25/2020 FINDINGS: Brain: There is atrophy and chronic small vessel disease changes. 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 No acute intracranial abnormality. Specifically, no hemorrhage, hydrocephalus, mass lesion, acute infarction, or significant intracranial injury. Vascular: No hyperdense vessel or unexpected calcification. Skull: No acute calvarial abnormality. Sinuses/Orbits: Visualized paranasal sinuses and mastoids clear. Orbital soft tissues unremarkable. 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 Atrophy, chronic microvascular disease. No acute intracranial  abnormality. Electronically Signed   By: Rolm Baptise M.D.   On: 03/12/2021 15:23   DG CHEST PORT 1 VIEW  Result Date: 03/14/2021 CLINICAL DATA:  Respiratory failure EXAM: PORTABLE CHEST 1 VIEW COMPARISON:  03/28/2021 FINDINGS: Endotracheal tube terminates 4.1 cm above the carina. Left IJ venous catheter terminates in the mid SVC. Mild right basilar atelectasis. Left lung is clear. No pleural effusion or pneumothorax. Enteric tube courses into the proximal stomach with its side port at the GE junction. IMPRESSION: Endotracheal tube terminates 4.1 cm above the carina. Left IJ venous catheter terminates in the mid SVC. Electronically Signed   By: Julian Hy M.D.   On: 03/14/2021 02:43   DG Chest Port 1 View  Result Date: 03/19/2021 CLINICAL DATA:  Acute respiratory failure with hypoxia EXAM: PORTABLE CHEST 1 VIEW COMPARISON:  03/14/2021 at 1610 hours FINDINGS: Minimal bibasilar atelectasis.  No pleural effusion or pneumothorax. The heart is normal in size. Endotracheal tube terminates 7 mm above the carina. Enteric tube courses into the proximal stomach with its side port at the GE junction. IMPRESSION: Minimal bibasilar atelectasis. Support apparatus as above. Electronically Signed   By: Julian Hy M.D.   On: 03/23/2021 19:20   DG Chest Portable 1 View  Result Date: 03/01/2021 CLINICAL DATA:  Post intubation, OG tube placement EXAM: PORTABLE CHEST 1 VIEW COMPARISON:  03/19/2021 FINDINGS: Endotracheal tube is 1.2 cm above the carina. NG tube is in the stomach. Minimal right base atelectasis. Left lung clear. Heart is normal size. No effusions or acute bony abnormality. IMPRESSION: Minimal right base atelectasis. Electronically Signed   By: Rolm Baptise M.D.   On: 03/03/2021 16:18   DG Chest Port 1 View  Result Date: 03/20/2021 CLINICAL DATA:  Sepsis.  Altered mental status.  Fever. EXAM: PORTABLE CHEST 1 VIEW COMPARISON:  09/26/2020 FINDINGS: The patient is rotated to the right on today's  radiograph, reducing diagnostic sensitivity and specificity. Atherosclerotic calcification of the aortic arch. Heart size within normal limits. No blunting of the costophrenic angles. Mild degenerative glenohumeral arthropathy on the left. Linear subsegmental atelectasis or scarring at the right lung base. IMPRESSION:  1. Subsegmental atelectasis at the right lung base; no compelling findings for active pneumonia. 2.  Aortic Atherosclerosis (ICD10-I70.0). Electronically Signed   By: Van Clines M.D.   On: 03/14/2021 13:42   CT Renal Stone Study  Result Date: 03/04/2021 CLINICAL DATA:  Sepsis, urinary tract infections EXAM: CT ABDOMEN AND PELVIS WITHOUT CONTRAST TECHNIQUE: Multidetector CT imaging of the abdomen and pelvis was performed following the standard protocol without IV contrast. COMPARISON:  02/14/2013 FINDINGS: Lower chest: Mild scarring or atelectasis in the right lower lobe. Circumflex and right coronary artery and descending thoracic aortic atherosclerotic calcification. Hepatobiliary: Unremarkable Pancreas: Unremarkable Spleen: Unremarkable Adrenals/Urinary Tract: Low-density fullness of both adrenal glands without mass identified. Vascular calcification along the left renal hilum, image 74 series 5. Punctate 1-2 mm right kidney upper pole calcification on image 84 series 5, probably a renal calculus. Borderline right hydronephrosis without ureteral calculus or specific cause identified. Stomach/Bowel: Descending and sigmoid colon diverticulosis without findings of active diverticulitis. Circumferential wall thickening in the lower rectum extending to the anorectal junction for example on image 77 series 2, cannot exclude inflammation or tumor, correlate with digital rectal exam findings. Vascular/Lymphatic: Aortoiliac atherosclerotic vascular disease. Reproductive: Posterior uterine body fibroid. Other: Cutaneous and subcutaneous edema posterolateral to the right hip on image 87 series 2.  Musculoskeletal: Severe bilateral hip arthropathy with protrusio. Lumbar spondylosis and degenerative disc disease causing multilevel impingement. Mild grade 1 degenerative retrolisthesis at L2-3. Small indirect right inguinal hernia contains adipose tissue. IMPRESSION: 1. There borderline right hydronephrosis without ureteral calculus or visible cause. 2. Punctate 1-2 mm right kidney upper pole nonobstructive renal calculus. 3. Wall thickening in the lower rectum and anorectal junction, inflammation or tumor not excluded. 4.  Aortic Atherosclerosis (ICD10-I70.0).  Coronary atherosclerosis. 5. Descending and sigmoid colon diverticulosis. 6. Uterine fibroid. 7. Cutaneous and subcutaneous edema overlying the right hip, cause uncertain. 8. Severe bilateral hip arthropathy with protrusio. 9. Multilevel lumbar impingement. Electronically Signed   By: Van Clines M.D.   On: 03/29/2021 14:25     Assessment and Plan:   # ACS # STEMI  While EKG is concerning for St elevations in V1-V2 consistent with septal infarct, she remains in status epilepticus requiring propofol infusion. Moreover family has not been able to reached, and her baseline neurological status remains unclear. Considering all of this plus discussion with interventional cardiologist attending on call, primary team and neurologist, decision made not to take her to [facility] and manage her medically. Concern is of her overall neurologic status and the inability to monitor her stability while in the procedure. Heparin Bolus Aspirin Load Atorvastatin Echo to assess for WMA Continue to trend troponins   For questions or updates, please contact [place] Please consult [email or web address] for contact info under    Signed, Jaci Lazier, MD  03/14/2021 4:59 AM

## 2021-03-14 NOTE — Procedures (Signed)
Patient Name: [name]  MRN: [id number]  Epilepsy Attending: Charlsie Quest  Referring Physician/Provider: Dr Milon Dikes Duration: Apr 06, 2021 2001 to 03/14/2021 2001  Patient history: 75 year old with known history of seizures on 4 antiepileptics, coming in with breakthrough seizures in the setting of likely UTI.  EEG to evaluate for seizures.   Level of alertness:  comatose  AEDs during EEG study: Propofol, versed, LEV, LCM, PHT, LTG  Technical aspects: This EEG study was done with scalp electrodes positioned according to the 10-20 International system of electrode placement. Electrical activity was acquired at a sampling rate of 500Hz  and reviewed with a high frequency filter of 70Hz  and a low frequency filter of 1Hz . EEG data were recorded continuously and digitally stored.   Description: EEG showed non-convulsive status epilepticus arising from left hemisphere. There is also sharply contoured polymorphic 5-9hz  theta-alpha activity as well as 2-3Hz  delta slowing in right hemisphere. As propofol was titrated up, brief 1-3 seconds of generalized eeg attenuation were noted. After around 1016 on 03/14/2021, EEG improved with 12-15 seconds of generalized eeg suppression alternating with bursts of highly epileptiform discharges on average lasting 3-6 seconds. Hyperventilation and photic stimulation were not performed.     ABNORMALITY - Focal non-convulsive status epilepticus, left hemisphere - Continuous slow, right hemisphere  IMPRESSION: This study initially showed evidence of non-convulsive status epilepticus arising from left hemisphere as well as profound diffuse encephalopathy, likely due to sedation, status epilepticus. After versed was started around 1015 on 03/14/2021, status epilepticus improved but EEG continued to show highly epileptiform bursts suggestive of high potential for seizure recurrence.   Dr and Dr 03/10/2021 were intermittently notified.   Claude Waldman 03/23/2021

## 2021-03-14 NOTE — Progress Notes (Signed)
ANTICOAGULATION CONSULT NOTE  Pharmacy Consult for Heparin Indication: chest pain/ACS  Allergies  Allergen Reactions   Depakote [Valproic Acid] Other (See Comments)    unknown   Haldol [Haloperidol Lactate] Other (See Comments)    Unknown    Penicillins Other (See Comments)    Unknown    Shellfish Allergy Other (See Comments)    unknown   Trileptal [Oxcarbazepine] Other (See Comments)    unknown    Patient Measurements: Height: _0  (170.2 cm) Weight: 52 kg (114 lb 10.2 oz) IBW/kg (Calculated) : 61.6 Heparin Dosing Weight: 52 kg  Vital Signs: Temp: 97.9 F (36.6 C) (07/16 1630) Temp Source: Bladder (07/16 1600) BP: 99/63 (07/16 1630) Pulse Rate: 70 (07/16 1630)  Labs: Recent Labs    03/12/21 1118 03/08/2021 1306 03/20/2021 1926 03/14/21 0122 03/14/21 0447 03/14/21 1600  HGB 14.1 16.1*  --  12.4  --   --   HCT 42.4 47.1*  --  36.8  --   --   PLT 209 197  --  180  --   --   APTT  --  30  --   --   --   --   LABPROT  --  13.3  --   --   --   --   INR  --  1.0  --   --   --   --   HEPARINUNFRC  --   --   --   --   --  0.91*  CREATININE 0.47 1.29*  --  1.38*  --   --   CKTOTAL  --   --  543*  --   --   --   CKMB  --   --  7.8*  --   --   --   TROPONINIHS  --   --   --  1,665* 1,463*  --      Estimated Creatinine Clearance: 29.8 mL/min (A) (by C-G formula based on SCr of 1.38 mg/dL (H)).   Medical History: Past Medical History:  Diagnosis Date   Asthma    CHF (congestive heart failure) (HCC)    Diabetes mellitus without complication (HCC)    Hepatitis A    Hepatitis B    Hepatitis C    Hypertension    Respiratory failure (HCC)    Schizophrenia ([place])    Seizures ([place])    Smoker    1 pack daily    Medications:  Scheduled:   [START ON 03/15/2021] aspirin  81 mg Per NG tube Daily   atorvastatin  40 mg Per NG tube q1800   benztropine  0.5 mg Per Tube BID   chlorhexidine gluconate (MEDLINE KIT)  15 mL Mouth Rinse BID   Chlorhexidine Gluconate Cloth   6 each Topical Daily   [START ON 03/15/2021] feeding supplement (VITAL HIGH PROTEIN)  1,000 mL Per Tube Q24H   fluPHENAZine  5 mg Per Tube BID   insulin aspart  0-9 Units Subcutaneous Q4H   lamoTRIgine  200 mg Per Tube BID   mouth rinse  15 mL Mouth Rinse 10 times per day   pantoprazole sodium  40 mg Per Tube Daily   phenytoin (DILANTIN) IV  100 mg Intravenous Q8H   potassium chloride  40 mEq Oral Once   sodium chloride flush  10-40 mL Intracatheter Q12H   Infusions:   sodium chloride Stopped (03/14/21 1553)   sodium chloride 125 mL/hr at 03/14/21 1600   cefTRIAXone (ROCEPHIN)  IV 200 mL/hr  at 03/14/21 1600   heparin 700 Units/hr (03/14/21 1600)   lacosamide (VIMPAT) IV Stopped (03/14/21 1024)   levETIRAcetam Stopped (03/14/21 1211)   magnesium sulfate bolus IVPB     midazolam 15 mg/hr (03/14/21 1636)   norepinephrine (LEVOPHED) Adult infusion 11 mcg/min (03/14/21 1600)   propofol (DIPRIVAN) infusion 85 mcg/kg/min (03/14/21 1500)    Assessment: 75 y.o. F presents from [facility] for management of SE. Pt now with STEMI - unable to take to cardiac cath at this time. To begin heparin per pharmacy. Pt on SQ heparin for VTE prophylaxis - last dose given ~0530. CBC stable.  Initial heparin level came back supratherapeutic at 0.91, on 700 units/hr. Repeat came back still elevated at 0.83. No s/sx of bleeding or infusion issues.   Goal of Therapy:  Heparin level 0.3-0.7 units/ml Monitor platelets by anticoagulation protocol: Yes   Plan:  Reduce heparin gtt to 600 units/hr Will f/u heparin level in 8 hours Daily heparin level and CBC  Antonietta Jewel, PharmD, [place] Pharmacist  Phone: [phone] 03/14/2021 6:24 PM  Please check AMION for all [place] phone numbers After 10:00 PM, call [place] [phone]

## 2021-03-14 NOTE — Progress Notes (Signed)
Neurology Progress Note  Patient ID: [name] is a 75 y.o. with PMHx of  has a past medical history of Asthma, CHF (congestive heart failure) (HCC), Diabetes mellitus without complication (HCC), Hepatitis A, Hepatitis B, Hepatitis C, Hypertension, Respiratory failure (HCC), Schizophrenia (HCC), Seizures (HCC), and Smoker.  Initially consulted for: Status epilepticus, iso recent UTI and medication non-adherence  Major interval events:  - Maxed on 2 pressors (levo and norepi)  Subjective: - Not interactive  Exam: Vitals:   03/14/21 0745 03/14/21 0800  BP: (!) 78/60 (!) 78/61  Pulse: 98 98  Resp: 16 16  Temp: (!) 97 F (36.1 C) (!) 97.2 F (36.2 C)  SpO2: 99% 98%   Physical Exam  Constitutional: Appears well-developed and well-nourished.  Psych: Minimally interactive Eyes: Scleral edema is absent  HENT: ET tube in place MSK: no joint deformities.  Cardiovascular: Normal rate and regular rhythm.  Respiratory: Breathing comfortably on the ventilator,  GI: Soft.  No distension. There is no tenderness.  Skin: Warm dry and intact visible skin.  There is no significant edema   Neuro: Mental Status: Does not open eyes spontaneously, to voice or noxious stimulation Does not follow any commands Cranial Nerves: II: No blink to threat. Pupils are equal, round, and reactive to light.   III,IV, VI/VIII: EOMI absent V/VII: Facial sensation is absent to eyelash brush and saline VIII: No response to voice X/XI: Intact cough/gag XII: Unable to assess tongue protrusion secondary to patient's mental status  Motor/Sensory: Tone is normal. Bulk is normal.  RLE triple flex to noxious stim Deep Tendon Reflexes: 2+ biceps, absent in patellae Plantars: Toes are mute bilaterally.  Cerebellar: Unable to assess secondary to patient's mental status   Pertinent Labs:  Basic Metabolic Panel: Recent Labs  Lab 03/12/21 1118 03/11/2021 1306 03/14/21 0122  NA 138 139 136  K 4.3 3.9  3.3*  CL 100 100 102  CO2 26 24 20*  GLUCOSE 141* 166* 159*  BUN 10 34* 39*  CREATININE 0.47 1.29* 1.38*  CALCIUM 9.6 9.6 8.5*  MG  --   --  1.7    CBC: Recent Labs  Lab 03/12/21 1118 03/26/2021 1306 03/14/21 0122  WBC 5.9 13.7* 14.8*  NEUTROABS  --  8.5*  --   HGB 14.1 16.1* 12.4  HCT 42.4 47.1* 36.8  MCV 98.1 96.7 96.1  PLT 209 197 180    Coagulation Studies: Recent Labs    03/07/2021 1306  LABPROT 13.3  INR 1.0   Lab Results  Component Value Date   PHENYTOIN <2.5 (L) 03/09/2021   VALPROATE <10.0 (L) 03/28/2013     Impression: Refractory status epilepticus secondary to missed medication doses  Comorbidities - Asthma - CHF, NSTEMI likely Type II, appreciate cardiology following - HepA/B/C - DM - Schizophrenia -"Staff reported to EMS that pt has refused her medications for the last few days." - Undetectable dilantin levels    Recommendations: - Midazolam 10.4 mg (0.2 mg/kg loading dose) with marked improvement in EEG   Ordered for a total of up to 10 boluses (received 1 already overnight prior to my arrival), for total max load of 2 mg/kg - Start 5.2 mg (0.1 mg/kg/hr) midaz drip, with every bolus this has been increased by 5 mg - continue propofol at 85 mcg/hr - appreciate pressor management for MAP > 65 and vent management per CCM - appreciate cardiology on board for NSTEMI -Continue Keppra 1g BID IV, Lamotrigine 200mg  BID per tube and then po when  able to. Vimpat 200mg  BID IV and lamotrigine 200 mg twice daily Note that she is currently over the 750 mg every 12 hours max recommended but given her refractory status we will continue this dose for now.  Consider adjusting if creatinine clearance drops below 30 Reference chart for creatinine clearance based dosing is below. Patient's Estimated Creatinine Clearance: 29.8 mL/min (A) (by C-G formula based on SCr of 1.38 mg/dL (H)).   CrCl 80 to 130 mL/minute/1.73 m2: 500 mg to 1.5 g every 12 hours.  CrCl 50 to  <80 mL/minute/1.73 m2: 500 mg to 1 g every 12 hours.  CrCl 30 to <50 mL/minute/1.73 m2: 250 to 750 mg every 12 hours.  CrCl 15 to <30 mL/minute/1.73 m2: 250 to 500 mg every 12 hours.  CrCl <15 mL/minute/1.73 m2: 250 to 500 mg every 24 hours (expert opinion). - Addition of phenobarbital may be considered next, and/or perampanel  MD-PhD [facility] [phone]   An additional 65 minutes of critical care time been spent in care of this patient today, including bedside examination and frequent reassessment of EEG with titration of antiseizure medications

## 2021-03-14 NOTE — Progress Notes (Signed)
    Was contacted by the on-call cardiology fellow to review this patient.  75 year old woman with history of dementia and schizophrenia as well as seizure disorder with history of status epilepticus who was transferred from [facility] for management of status epilepticus.  She is currently intubated on propofol along with multiple antiseizure medications.  She became hypotensive on propofol is requiring pressors.  EKG was checked at 1:20 AM on the 16th which does show some ST elevation in V1 and V2 which is still persistent now at roughly 4 AM.  Unfortunately, because of transport and lack of clinical indication, no EKG was checked from 1 PM on the 15th.  At the time of the EKG checked at 1:20 AM, troponin was elevated at 1500 indicating that she was well into a myocardial event.  Quite likely she may be having a septal MI.  Clinically, however she remains in status epilepticus requiring propofol infusion which is not fully keeping her from seizing.  At this point I do not think that she would be clinically stable to take to the cardiac catheterization lab for evaluation by cardiac catheterization.  Primary team is unable to get a hold of the family to discussed this with family and she clearly cannot consent.  In addition, she may well be by the time we could take her to the Cath Lab, she would be well into the 6 to 8-hour of her infarct meaning that significant recovery would be unlikely regardless of potential revascularization.  My concern is of her overall neurologic status and the inability to monitor her stability while in the procedure.  I do not think that she is a candidate for invasive cardiac evaluation at this point.  We will check a bedside echo and recommend medical management with IV heparin and aspirin.  Supportive care.   See full consult note by the on-call fellow.    Bryan Lemma, MD  Bryan Lemma, M.D., M.S. Interventional Cardiologist   Pager # [phone] Phone #  [phone] [address] [place], [place] [address]

## 2021-03-15 DIAGNOSIS — G40901 Epilepsy, unspecified, not intractable, with status epilepticus: Secondary | ICD-10-CM | POA: Diagnosis not present

## 2021-03-15 DIAGNOSIS — J9601 Acute respiratory failure with hypoxia: Secondary | ICD-10-CM | POA: Diagnosis not present

## 2021-03-15 DIAGNOSIS — G40411 Other generalized epilepsy and epileptic syndromes, intractable, with status epilepticus: Secondary | ICD-10-CM | POA: Diagnosis not present

## 2021-03-15 DIAGNOSIS — I214 Non-ST elevation (NSTEMI) myocardial infarction: Secondary | ICD-10-CM | POA: Diagnosis not present

## 2021-03-15 DIAGNOSIS — A419 Sepsis, unspecified organism: Secondary | ICD-10-CM | POA: Diagnosis not present

## 2021-03-15 DIAGNOSIS — R569 Unspecified convulsions: Secondary | ICD-10-CM | POA: Diagnosis not present

## 2021-03-15 LAB — POCT I-STAT 7, (LYTES, BLD GAS, ICA,H+H)
Acid-base deficit: 8 mmol/L — ABNORMAL HIGH (ref 0.0–2.0)
Bicarbonate: 17.9 mmol/L — ABNORMAL LOW (ref 20.0–28.0)
Calcium, Ion: 1.22 mmol/L (ref 1.15–1.40)
HCT: 32 % — ABNORMAL LOW (ref 36.0–46.0)
Hemoglobin: 10.9 g/dL — ABNORMAL LOW (ref 12.0–15.0)
O2 Saturation: 96 %
Patient temperature: 36.8
Potassium: 5.1 mmol/L (ref 3.5–5.1)
Sodium: 140 mmol/L (ref 135–145)
TCO2: 19 mmol/L — ABNORMAL LOW (ref 22–32)
pCO2 arterial: 36 mmHg (ref 32.0–48.0)
pH, Arterial: 7.304 — ABNORMAL LOW (ref 7.350–7.450)
pO2, Arterial: 92 mmHg (ref 83.0–108.0)

## 2021-03-15 LAB — BLOOD CULTURE ID PANEL (REFLEXED) - BCID2

## 2021-03-15 LAB — COMPREHENSIVE METABOLIC PANEL
ALT: 45 U/L — ABNORMAL HIGH (ref 0–44)
AST: 59 U/L — ABNORMAL HIGH (ref 15–41)
Albumin: 2.5 g/dL — ABNORMAL LOW (ref 3.5–5.0)
Alkaline Phosphatase: 59 U/L (ref 38–126)
Anion gap: 10 (ref 5–15)
BUN: 45 mg/dL — ABNORMAL HIGH (ref 8–23)
CO2: 18 mmol/L — ABNORMAL LOW (ref 22–32)
Calcium: 7.3 mg/dL — ABNORMAL LOW (ref 8.9–10.3)
Chloride: 106 mmol/L (ref 98–111)
Creatinine, Ser: 1.71 mg/dL — ABNORMAL HIGH (ref 0.44–1.00)
GFR, Estimated: 31 mL/min — ABNORMAL LOW (ref >60)
Glucose, Bld: 237 mg/dL — ABNORMAL HIGH (ref 70–99)
Potassium: 3.2 mmol/L — ABNORMAL LOW (ref 3.5–5.1)
Sodium: 134 mmol/L — ABNORMAL LOW (ref 135–145)
Total Bilirubin: 0.2 mg/dL — ABNORMAL LOW (ref 0.3–1.2)
Total Protein: 4.6 g/dL — ABNORMAL LOW (ref 6.5–8.1)

## 2021-03-15 LAB — MAGNESIUM
Magnesium: 2 mg/dL (ref 1.7–2.4)
Magnesium: 2 mg/dL (ref 1.7–2.4)
Magnesium: 2 mg/dL (ref 1.7–2.4)

## 2021-03-15 LAB — PHOSPHORUS
Phosphorus: 4 mg/dL (ref 2.5–4.6)
Phosphorus: 3.1 mg/dL (ref 2.5–4.6)

## 2021-03-15 LAB — CBC
HCT: 33 % — ABNORMAL LOW (ref 36.0–46.0)
Hemoglobin: 10.8 g/dL — ABNORMAL LOW (ref 12.0–15.0)
MCH: 32.6 pg (ref 26.0–34.0)
MCHC: 32.7 g/dL (ref 30.0–36.0)
MCV: 99.7 fL (ref 80.0–100.0)
Platelets: 142 10*3/uL — ABNORMAL LOW (ref 150–400)
RBC: 3.31 MIL/uL — ABNORMAL LOW (ref 3.87–5.11)
RDW: 14 % (ref 11.5–15.5)
WBC: 14 10*3/uL — ABNORMAL HIGH (ref 4.0–10.5)
nRBC: 0 % (ref 0.0–0.2)

## 2021-03-15 LAB — HEPARIN LEVEL (UNFRACTIONATED): Heparin Unfractionated: 0.62 IU/mL (ref 0.30–0.70)

## 2021-03-15 LAB — TRIGLYCERIDES: Triglycerides: 135 mg/dL (ref <150)

## 2021-03-15 LAB — GLUCOSE, CAPILLARY
Glucose-Capillary: 158 mg/dL — ABNORMAL HIGH (ref 70–99)
Glucose-Capillary: 175 mg/dL — ABNORMAL HIGH (ref 70–99)
Glucose-Capillary: 209 mg/dL — ABNORMAL HIGH (ref 70–99)
Glucose-Capillary: 210 mg/dL — ABNORMAL HIGH (ref 70–99)
Glucose-Capillary: 212 mg/dL — ABNORMAL HIGH (ref 70–99)
Glucose-Capillary: 231 mg/dL — ABNORMAL HIGH (ref 70–99)
Glucose-Capillary: 239 mg/dL — ABNORMAL HIGH (ref 70–99)

## 2021-03-15 LAB — URINE CULTURE: Culture: >=100000 — AB

## 2021-03-15 LAB — PHENYTOIN LEVEL, TOTAL: Phenytoin Lvl: 12.1 ug/mL (ref 10.0–20.0)

## 2021-03-15 LAB — LACTIC ACID, PLASMA: Lactic Acid, Venous: 1.1 mmol/L (ref 0.5–1.9)

## 2021-03-15 MED ORDER — POTASSIUM CHLORIDE 10 MEQ/50ML IV SOLN
10.0000 meq | INTRAVENOUS | Status: AC
  Administered 2021-03-15 (×4): 10 meq via INTRAVENOUS
  Filled 2021-03-15 (×4): qty 50

## 2021-03-15 MED ORDER — PERAMPANEL 2 MG PO TABS
8.0000 mg | ORAL_TABLET | Freq: Once | ORAL | Status: AC
  Administered 2021-03-15: 8 mg
  Filled 2021-03-15: qty 4

## 2021-03-15 MED ORDER — INSULIN ASPART 100 UNIT/ML IJ SOLN
0.0000 [IU] | INTRAMUSCULAR | Status: DC
  Administered 2021-03-15: 4 [IU] via SUBCUTANEOUS
  Administered 2021-03-15 (×2): 7 [IU] via SUBCUTANEOUS
  Administered 2021-03-15: 3 [IU] via SUBCUTANEOUS
  Administered 2021-03-15: 4 [IU] via SUBCUTANEOUS
  Administered 2021-03-16: 11 [IU] via SUBCUTANEOUS
  Administered 2021-03-16: 4 [IU] via SUBCUTANEOUS
  Administered 2021-03-16: 7 [IU] via SUBCUTANEOUS

## 2021-03-15 MED ORDER — PERAMPANEL 2 MG PO TABS
16.0000 mg | ORAL_TABLET | Freq: Once | ORAL | Status: AC
  Administered 2021-03-15: 16 mg via ORAL
  Filled 2021-03-15: qty 8

## 2021-03-15 MED ORDER — POTASSIUM CHLORIDE 20 MEQ PO PACK
40.0000 meq | PACK | Freq: Once | ORAL | Status: AC
  Administered 2021-03-15: 40 meq
  Filled 2021-03-15: qty 2

## 2021-03-15 MED ORDER — VITAL HIGH PROTEIN PO LIQD
1000.0000 mL | ORAL | Status: DC
  Administered 2021-03-15 – 2021-03-16 (×4): 1000 mL

## 2021-03-15 MED ORDER — PERAMPANEL 2 MG PO TABS: 4.0000 mg | ORAL_TABLET | Freq: Every day | ORAL | Status: DC

## 2021-03-15 NOTE — Procedures (Addendum)
Patient Name: [name]  MRN: [id number]  Epilepsy Attending: Charlsie Quest  Referring Physician/Provider: Dr Milon Dikes Duration: 03/14/2021 2001 to 03/14/2021 2001   Patient history: 75 year old with known history of seizures on 4 antiepileptics, coming in with breakthrough seizures in the setting of likely UTI.  EEG to evaluate for seizures.    Level of alertness:  comatose   AEDs during EEG study: Propofol, versed, LEV, LCM, PHT, LTG   Technical aspects: This EEG study was done with scalp electrodes positioned according to the 10-20 International system of electrode placement. Electrical activity was acquired at a sampling rate of 500Hz  and reviewed with a high frequency filter of 70Hz  and a low frequency filter of 1Hz . EEG data were recorded continuously and digitally stored.   Description: EEG initially showed 12-15 seconds of generalized eeg suppression alternating with generalized and lateralized left hemisphere bursts of highly epileptiform discharges on average lasting 3-6 seconds. Intermittent brief low amplitude 15-18hz  generalized beta activity was also noted. Two seizure without clinical signs were noted arising from left hemisphere on 03/15/2021 at 1628 and 1855 lasting about 1 minute 15 seconds each. Hyperventilation and photic stimulation were not performed.      ABNORMALITY - Seizure without clinical signs, left hemisphere  - Burst suppression with highly epileptiform discharges, generalized and lateralized left hemisphere   IMPRESSION: This study showed two seizure without clinical signs arising from left hemisphere on 03/15/2021 at 1628 and 1855 lasting about 1 minute 15 seconds each. There is also generalized and lateralized left hemisphere highly epileptiform bursts which is on the ictal-interictal continuum with high potential for seizure recurrence. Additionally, there is profound diffuse encephalopathy, likely due to sedation, seizures.   [name] 

## 2021-03-15 NOTE — Progress Notes (Addendum)
ANTICOAGULATION CONSULT NOTE  Pharmacy Consult for Heparin Indication: chest pain/ACS  Allergies  Allergen Reactions   Depakote [Valproic Acid] Other (See Comments)    unknown   Haldol [Haloperidol Lactate] Other (See Comments)    Unknown    Penicillins Other (See Comments)    Unknown    Shellfish Allergy Other (See Comments)    unknown   Trileptal [Oxcarbazepine] Other (See Comments)    unknown    Patient Measurements: Height: 5' 7" (170.2 cm) Weight: 70.9 kg (156 lb 4.9 oz) IBW/kg (Calculated) : 61.6 Heparin Dosing Weight: 52 kg  Vital Signs: Temp: 99.32 F (37.4 C) (07/17 0600) Temp Source: Bladder (07/17 0400) BP: 103/54 (07/17 0600) Pulse Rate: 73 (07/17 0600)  Labs: Recent Labs    03/15/2021 1306 03/01/2021 1926 03/14/21 0122 03/14/21 0447 03/14/21 1600 03/14/21 1651 03/15/21 0538  HGB 16.1*  --  12.4  --   --   --  10.8*  HCT 47.1*  --  36.8  --   --   --  33.0*  PLT 197  --  180  --   --   --  142*  APTT 30  --   --   --   --   --   --   LABPROT 13.3  --   --   --   --   --   --   INR 1.0  --   --   --   --   --   --   HEPARINUNFRC  --   --   --   --  0.91* 0.83* 0.62  CREATININE 1.29*  --  1.38*  --   --   --  1.71*  CKTOTAL  --  543*  --   --   --   --   --   CKMB  --  7.8*  --   --   --   --   --   TROPONINIHS  --   --  1,665* 1,463*  --   --   --      Estimated Creatinine Clearance: 28.5 mL/min (A) (by C-G formula based on SCr of 1.71 mg/dL (H)).   Medical History: Past Medical History:  Diagnosis Date   Asthma    CHF (congestive heart failure) (HCC)    Diabetes mellitus without complication (HCC)    Hepatitis A    Hepatitis B    Hepatitis C    Hypertension    Respiratory failure (HCC)    Schizophrenia (HCC)    Seizures (HCC)    Smoker    1 pack daily    Medications:  Scheduled:   aspirin  81 mg Per NG tube Daily   atorvastatin  40 mg Per NG tube q1800   benztropine  0.5 mg Per Tube BID   chlorhexidine gluconate (MEDLINE KIT)   15 mL Mouth Rinse BID   Chlorhexidine Gluconate Cloth  6 each Topical Daily   feeding supplement (VITAL HIGH PROTEIN)  1,000 mL Per Tube Q24H   fluPHENAZine  5 mg Per Tube BID   insulin aspart  0-9 Units Subcutaneous Q4H   lamoTRIgine  200 mg Per Tube BID   mouth rinse  15 mL Mouth Rinse 10 times per day   pantoprazole sodium  40 mg Per Tube Daily   perampanel  2 mg Per Tube QHS   phenytoin (DILANTIN) IV  100 mg Intravenous Q8H   sodium chloride flush  10-40 mL Intracatheter Q12H     Infusions:   sodium chloride Stopped (03/14/21 2147)   sodium chloride 125 mL/hr at 03/15/21 0600   cefTRIAXone (ROCEPHIN)  IV Stopped (03/14/21 1625)   heparin 600 Units/hr (03/15/21 0600)   lacosamide (VIMPAT) IV Stopped (03/14/21 2217)   levETIRAcetam Stopped (03/14/21 2314)   midazolam (VERSED) infusion 60 mg/hr (03/15/21 0600)   norepinephrine (LEVOPHED) Adult infusion 14 mcg/min (03/15/21 0600)   propofol (DIPRIVAN) infusion 85 mcg/kg/min (03/15/21 0600)    Assessment: 75 y.o. F presents from [facility] for management of SE. Pt now with STEMI - unable to take to cardiac cath at this time. To begin heparin per pharmacy. Cardiology plans for 48h duration through 7/18 0700.  Heparin level now therapeutic at 0.62 on 600 units/hr. Hb down 10.8 from 12.4 and platelets down 142 from 180 but no s/sx of bleeding or infusion issues.   Goal of Therapy:  Heparin level 0.3-0.7 units/ml Monitor platelets by anticoagulation protocol: Yes   Plan:  Continue heparin gtt 600 units/hr Daily heparin level and CBC   , PharmD PGY1 Pharmacy Resident 03/15/2021  7:20 AM  Please check [email or web address] for unit-specific pharmacy phone numbers.    

## 2021-03-15 NOTE — Progress Notes (Signed)
EEG maintenance complete. No skin breakdown at FP1 FP2 A1 A2. Continue to monitor

## 2021-03-15 NOTE — Progress Notes (Signed)
Subjective: Continues to have highly epileptiform bursts, but is burst suppressed  Exam: Vitals:   03/15/21 1010 03/15/21 1020  BP:    Pulse: 61 62  Resp: 16 16  Temp: (!) 96.4 F (35.8 C) (!) 96.4 F (35.8 C)  SpO2: 95% 95%   Gen: In bed, intubated Resp: Ventilated Abd: soft, nt  Neuro: Severely limited by induced coma, her pupils are reactive but otherwise exam is limited.  Pertinent Labs: Magnesium 2.0 Triglycerides are stable at 135 Creatinine 1.71 Calcium corrects to 8.5  Impression: 75 year old female with super refractory status epilepticus in the setting of medication noncompliance and longstanding history of epilepsy.  Recommendations: 1) continue phenytoin 100 3 times daily, daily levels.  2) continue levetiracetam 1 g twice daily(GFR 31)  3) perampanel load of 16 mg and continue 2 mg daily 4) continue lamotrigine 200 mg twice daily 5) continue lacosamide 200 mg twice daily 6) continue Versed at 60 mg/hr  and propofol at 51mcg/kg/min  This patient is critically ill and at significant risk of neurological worsening, death and care requires constant monitoring of vital signs, hemodynamics,respiratory and cardiac monitoring, neurological assessment, discussion with family, other specialists and medical decision making of high complexity. I spent 45 minutes of neurocritical care time  in the care of  this patient. This was time spent independent of any time provided by nurse practitioner or PA.  Ritta Slot, MD [facility] [phone]  If 7pm- 7am, please page neurology on call as listed in AMION. 03/15/2021  10:39 AM

## 2021-03-15 NOTE — Progress Notes (Signed)
NAME:  [name], MRN:  [id number], DOB:  07/19/1946, LOS: 2 ADMISSION DATE:  03/25/2021, CONSULTATION DATE:  7/15 REFERRING MD:  Katrinka Blazing, CHIEF COMPLAINT:  vent management and critical care in setting of status epilepticus   History of Present Illness:  75 year old female patient who resides at a skilled nursing facility due to cognitive impairments as a consequences of schizophrenia, and dementia.  It is unclear what her functional status is otherwise.  Initially brought to the emergency room on 7/14 after having a witnessed single episode of seizure found to have a urinary tract infection, no further seizure witnessed, sent back to skilled nursing facility on Keflex...  Apparently continued to be in encephalopathic after returning to nursing home, had fever of 101.6, as well as  new tachycardia and was sent back to the emergency room for evaluation.  Was to be admitted with a working diagnosis of urinary tract sepsis and treated with antibiotics but subsequently had witnessed seizure while in the emergency room this consisted primarily of right gaze deviation, right face and neck arm twitching, followed by worsening mental status.  She was administered benzodiazepines, IV Keppra, and subsequently intubated for airway protection and transferred to [facility] for neuro critical care services and continuous EEG monitoring.  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  Hypertension, asthma.  Prior stroke.  Hyperlipidemia.  Resides at skilled nursing facility. Significant Hospital Events: Including procedures, antibiotic start and stop dates in addition to other pertinent events   7/14: Seen in emergency room for seizure, diagnosed with urinary tract infection, sent back to nursing home on Keflex 7/15: Persistent encephalopathy, new fever and  tachycardia presented back to the emergency room with working diagnosis of of urosepsis but subsequently had several witnessed seizures, and loss of airway protection requiring intubation transferred from [facility] to [facility] for neuro critical care services and continuous EEG monitoring 7/16 Overnight Cardiology consulted for concern for STEMI. Interventional cardiology reviewed case and cath deferred d/t clinical instability Interim History / Subjective:   Objective   Blood pressure 114/60, pulse 60, temperature (!) 96.44 F (35.8 C), temperature source Bladder, resp. rate 16, height 5\' 7"  (1.702 m), weight 69.7 kg, SpO2 98 %.    Vent Mode: PRVC FiO2 (%):  [40 %] 40 % Set Rate:  [16 bmp] 16 bmp Vt Set:  [490 mL-510 mL] 490 mL PEEP:  [5 cmH20] 5 cmH20 Plateau Pressure:  [18 cmH20-21 cmH20] 21 cmH20   Intake/Output Summary (Last 24 hours) at 03/15/2021 2104 Last data filed at 03/15/2021 1900 Gross per 24 hour  Intake 7059.91 ml  Output 2295 ml  Net 4764.91 ml   Filed Weights   03/14/21 0500 03/15/21 0500 03/15/21 0800  Weight: 52 kg 70.9 kg 69.7 kg   Physical Exam: General: Chronically ill-appearing, no acute distress HENT: [place], AT, ETT in place, EEG leads in place Eyes: EOMI, no scleral icterus Respiratory: Diminished breath sounds bilaterally.  No crackles, wheezing or rales Cardiovascular: RRR, -M/R/G, no JVD GI: BS+, soft, nontender Extremities:-Edema,-tenderness Neuro: Unresponsive  Trop peak 1665 CK 543 LA 2.7 WBC 14.8 Phenytoin leve <2.5 subtherapeutic UA +leuk, nitrites  Worsening Cr with metabolic acidosis Unchanged leukocytosis  Resolved Hospital Problem list     Assessment & Plan:  Refractory status epilepticus.  Has history of known seizure disorder.  On 4 anticonvulsants currently.  Suspect this is  breakthrough from acute urinary tract infection, also has fever both of which could decrease seizure threshold. Phenytoin found subtherapeutic  Acute  metabolic encephalopathy secondary to seizure Superimposed on history of underlying dementia and schizophrenia.  Unclear what her baseline functional and cognitive status is by chart review 7/17 EEG lateralized left hemisphere highly epileptiform bursts which is on the ictal-interictal continuum with high potential for seizure recurrence.  Plan Appreciate Neuro involvement. AEDs per consult team On high dose versed and propofol gtt Continuous EEG monitoring Seizure precautions As needed benzodiazepines for breakthrough seizure Antibiotics as noted below For goals of care, unable to reach legal guardian despite leaving VM over the weekend  Acute hypoxic respiratory failure in the setting of ineffective airway protection and possible aspiration. Plan Full vent support Wean to minimal vent settings. Extubation precluded by seizure VAP bundle  Severe sepsis/septic shock as evidenced by lactic acidosis in the setting of urinary tract infection  Hypotension secondary to sedation Cultures have been sent Plan Wean levophed for MAP goal >65 Continue IV ceftriaxone Follow-up cultures Trend  lactate  Lactic acidosis.  Suspect this is multifactorial: Seizure, and possible sepsis. Plan Maintain MAP goal >65 with vasopressors Treat sepsis Treat seizures Trend LA  Acute kidney injury: Secondary to shock Plan Volume resuscitation Monitor BMET/UOP Holding all antihypertensives Renal dose medication Strict intake output  NSTEMI Reviewed EKG with cardiology today. Minimal ST elevation in V1 and V2 with no reciprocal changes. Prior EKGs demonstrated more pronounced changes in setting of critical illness. Echo also reassuring with normal EF and no WMA or significant valvular disease -Heparin gtt x 48 hours -ASA, statin -Appreciate Cardiology input  Hypokalemia Hypomag Plan Replete  Diabetes with hyperglycemia Plan Sliding scale insulin  Mild anemia w/out evidence of bleeding Plan   Trend cbc  Best Practice (right click and "Reselect all SmartList Selections" daily)   Diet/type: tubefeeds DVT prophylaxis: prophylactic heparin  GI prophylaxis: N/A Lines: Central line Foley:  N/A Code Status:  full code Last date of multidisciplinary goals of care discussion [pending ] - left VM with guardian on 7/16 with callback number  Labs   CBC: Recent Labs  Lab 03/12/21 1118 03/10/2021 1306 03/14/21 0122 03/15/21 0538  WBC 5.9 13.7* 14.8* 14.0*  NEUTROABS  --  8.5*  --   --   HGB 14.1 16.1* 12.4 10.8*  HCT 42.4 47.1* 36.8 33.0*  MCV 98.1 96.7 96.1 99.7  PLT 209 197 180 142*    Basic Metabolic Panel: Recent Labs  Lab 03/12/21 1118 03/17/2021 1306 03/14/21 0122 03/14/21 0122 03/14/21 1033 03/14/21 2015 03/15/21 0538 03/15/21 1106 03/15/21 1616  NA 138 139 136  --   --   --  134*  --   --   K 4.3 3.9 3.3*  --   --   --  3.2*  --   --   CL 100 100 102  --   --   --  106  --   --   CO2 26 24 20*  --   --   --  18*  --   --   GLUCOSE 141* 166* 159*  --   --   --  237*  --   --   BUN 10 34* 39*  --   --   --  45*  --   --   CREATININE 0.47 1.29* 1.38*  --   --   --  1.71*  --   --   CALCIUM 9.6 9.6 8.5*  --   --   --  7.3*  --   --   MG  --   --  1.7   < > 1.5* 2.0 2.0 2.0 2.0  PHOS  --   --   --   --  4.6 4.0 4.0  --  3.1   < > = values in this interval not displayed.   GFR: Estimated Creatinine Clearance: 28.5 mL/min (A) (by C-G formula based on SCr of 1.71 mg/dL (H)). Recent Labs  Lab 03/12/21 1118 03/14/2021 1306 03/22/2021 1513 03/21/2021 2213 03/14/21 0122 03/14/21 1730 03/15/21 0538  WBC 5.9 13.7*  --   --  14.8*  --  14.0*  LATICACIDVEN  --  1.8 3.5* 2.7*  --  2.2*  --     Liver Function Tests: Recent Labs  Lab 03/12/21 1118 03/04/2021 1306 03/15/21 0538  AST 37 43* 59*  ALT 26 27 45*  ALKPHOS 101 105 59  BILITOT 0.9 1.0 0.2*  PROT 7.4 7.9 4.6*  ALBUMIN 4.1 4.3 2.5*   No results for input(s): LIPASE, AMYLASE in the last 168 hours. Recent  Labs  Lab 03/10/2021 2213  AMMONIA 21    ABG    Component Value Date/Time   PHART 7.44 03/08/2021 1534   PCO2ART 32 03/18/2021 1534   PO2ART 111 (H) 03/08/2021 1534   HCO3 21.7 03/21/2021 1534   TCO2 21 07/11/2013 1157   ACIDBASEDEF 1.5 03/11/2021 1534   O2SAT 98.5 03/15/2021 1534     Coagulation Profile: Recent Labs  Lab 03/17/2021 1306  INR 1.0    Cardiac Enzymes: Recent Labs  Lab 03/12/2021 1926  CKTOTAL 543*  CKMB 7.8*    HbA1C: Hemoglobin A1C  Date/Time Value Ref Range Status  02/14/2013 01:40 AM 6.7 (H) 4.2 - 6.3 % Final    Comment:    The American Diabetes Association recommends that a primary goal of therapy should be <7% and that physicians should reevaluate the treatment regimen in patients with HbA1c values consistently >8%.    Hgb A1c MFr Bld  Date/Time Value Ref Range Status  03/12/2021 07:32 PM 6.6 (H) 4.8 - 5.6 % Final    Comment:    (NOTE) Pre diabetes:          5.7%-6.4%  Diabetes:              >6.4%  Glycemic control for   <7.0% adults with diabetes   09/26/2019 12:11 PM 6.6 (H) 4.8 - 5.6 % Final    Comment:    (NOTE) Pre diabetes:          5.7%-6.4% Diabetes:              >6.4% Glycemic control for   <7.0% adults with diabetes     CBG: Recent Labs  Lab 03/15/21 0341 03/15/21 0750 03/15/21 1117 03/15/21 1539 03/15/21 1943  GLUCAP 239* 212* 231* 210* 175*    Critical care time: 40 minutes      The patient is critically ill with multiple organ systems failure and requires high complexity decision making for assessment and support, frequent evaluation and titration of therapies, application of advanced monitoring technologies and extensive interpretation of multiple databases.  Independent Critical Care Time: 40 Minutes.   Mechele Collin, M.D. [facility] Pulmonary/Critical Care Medicine 03/15/2021 9:04 PM   Please see Amion for pager number to reach on-call Pulmonary and Critical Care Team.

## 2021-03-15 NOTE — Progress Notes (Signed)
eLink Physician-Brief Progress Note Patient Name: [name] DOB: 05-01-1946 MRN: [id number]   Date of Service  03/15/2021  HPI/Events of Note  Nursing concerned about "rhythm change". EKG reveals NSR with rate = 66. Wide QRS rhythm Non-specific intra-ventricular conduction block Anterolateral infarct , age undetermined. BP = 114/53 with MAP 69. Cardiology has seen the patient in consultation and does not feel the patient is a candidate for cardiac cath. Propofol Infusion Syndrome (PRIS) is associated with wide QRS, Acidosis, hypotension and lipemia. Triglyceride level already ordered for the AM. Nursing also having difficulty with seizure burst suppress.   eICU Interventions  Plan: ABG and BMP STAT to look for further evidence for PRIS.     Intervention Category Major Interventions: Other:  Lenell Antu 03/15/2021, 11:17 PM

## 2021-03-15 NOTE — Progress Notes (Signed)
EEG reviewed. Continues to exhibit a burst suppression pattern. Left sided leads with higher amplitude bursts than on the right. Bursts appear to originate from the left hemisphere.   Electronically signed: Dr. Caryl Pina

## 2021-03-15 NOTE — Progress Notes (Signed)
Pt's GCS is 3 because of medication management. Pt has status epilepticus, and on 85 of Propofol and 60 of Versed.

## 2021-03-15 NOTE — Progress Notes (Signed)
Cardiology Progress Note  Patient ID: [name] MRN: [id number] DOB: Jun 16, 1946 Date of Encounter: 03/15/2021  Primary Cardiologist: None  Subjective   Chief Complaint: Seizure  HPI: Continues to have seizures despite several antiepileptic agents.  Troponin elevation was minimal and flat.  Echo shows normal LV function.  She did not have an ST elevation myocardial infarction.  She remains critically ill in the ICU with continued seizure-like activity.  ROS:  All other ROS reviewed and negative. Pertinent positives noted in the HPI.     Inpatient Medications  Scheduled Meds:  aspirin  81 mg Per NG tube Daily   atorvastatin  40 mg Per NG tube q1800   benztropine  0.5 mg Per Tube BID   chlorhexidine gluconate (MEDLINE KIT)  15 mL Mouth Rinse BID   Chlorhexidine Gluconate Cloth  6 each Topical Daily   feeding supplement (VITAL HIGH PROTEIN)  1,000 mL Per Tube Q24H   fluPHENAZine  5 mg Per Tube BID   insulin aspart  0-9 Units Subcutaneous Q4H   lamoTRIgine  200 mg Per Tube BID   mouth rinse  15 mL Mouth Rinse 10 times per day   pantoprazole sodium  40 mg Per Tube Daily   perampanel  2 mg Per Tube QHS   phenytoin (DILANTIN) IV  100 mg Intravenous Q8H   sodium chloride flush  10-40 mL Intracatheter Q12H   Continuous Infusions:  sodium chloride Stopped (03/14/21 2147)   sodium chloride 125 mL/hr at 03/15/21 0600   cefTRIAXone (ROCEPHIN)  IV Stopped (03/14/21 1625)   heparin 600 Units/hr (03/15/21 0600)   lacosamide (VIMPAT) IV Stopped (03/14/21 2217)   levETIRAcetam Stopped (03/14/21 2314)   midazolam (VERSED) infusion 60 mg/hr (03/15/21 0600)   norepinephrine (LEVOPHED) Adult infusion 14 mcg/min (03/15/21 0600)   potassium chloride     propofol (DIPRIVAN) infusion 85 mcg/kg/min (03/15/21 0600)   PRN Meds: docusate, fentaNYL (SUBLIMAZE) injection, fentaNYL (SUBLIMAZE) injection, midazolam, polyethylene glycol, propofol, sodium chloride flush   Vital Signs   Vitals:    03/15/21 0300 03/15/21 0400 03/15/21 0500 03/15/21 0600  BP: (!) 98/53 (!) 102/58 (!) 105/54 (!) 103/54  Pulse: 71 74 74 73  Resp: _0 Temp: 98.78 F (37.1 C) 99.14 F (37.3 C) 99.32 F (37.4 C) 99.32 F (37.4 C)  TempSrc:  Bladder    SpO2: 97% 97% 97% 97%  Weight:   70.9 kg   Height:        Intake/Output Summary (Last 24 hours) at 03/15/2021 0742 Last data filed at 03/15/2021 0321 Gross per 24 hour  Intake 6846.94 ml  Output 505 ml  Net 6341.94 ml   Last 3 Weights 03/15/2021 03/14/2021 03/22/2021  Weight (lbs) 156 lb 4.9 oz 114 lb 10.2 oz 115 lb  Weight (kg) 70.9 kg 52 kg 52.164 kg      Telemetry  Overnight telemetry shows sinus rhythm in the 60s, which I personally reviewed.   ECG  The most recent ECG shows sinus rhythm heart rate 64, isolated ST elevation in V1 through V2, no reciprocal ST depressions, which I personally reviewed.   Physical Exam   Vitals:   03/15/21 0300 03/15/21 0400 03/15/21 0500 03/15/21 0600  BP: (!) 98/53 (!) 102/58 (!) 105/54 (!) 103/54  Pulse: 71 74 74 73  Resp: _1 Temp: 98.78 F (37.1 C) 99.14 F (37.3 C) 99.32 F (37.4 C) 99.32 F (37.4 C)  TempSrc:  Bladder    SpO2: 97%  97% 97% 97%  Weight:   70.9 kg   Height:        Intake/Output Summary (Last 24 hours) at 03/15/2021 0742 Last data filed at 03/15/2021 1155 Gross per 24 hour  Intake 6846.94 ml  Output 505 ml  Net 6341.94 ml    Last 3 Weights 03/15/2021 03/14/2021 03/09/2021  Weight (lbs) 156 lb 4.9 oz 114 lb 10.2 oz 115 lb  Weight (kg) 70.9 kg 52 kg 52.164 kg    Body mass index is 24.48 kg/m.   General: Intubated and sedated on the vent Head: Atraumatic, normal size  Eyes: PEERLA, EOMI  Neck: Supple, no JVD Endocrine: No thryomegaly Cardiac: Normal S1, S2; RRR; no murmurs, rubs, or gallops Lungs: Diminished breath sounds bilaterally Abd: Soft, nontender, no hepatomegaly  Ext: No edema, pulses 2+ Musculoskeletal: No deformities Skin: Warm and dry, no  rashes   Neuro: Intubated and sedated on the vent  Labs  High Sensitivity Troponin:   Recent Labs  Lab 03/14/21 0122 03/14/21 0447  TROPONINIHS 1,665* 1,463*     Cardiac EnzymesNo results for input(s): TROPONINI in the last 168 hours. No results for input(s): TROPIPOC in the last 168 hours.  Chemistry Recent Labs  Lab 03/12/21 1118 02/27/2021 1306 03/14/21 0122 03/15/21 0538  NA 138 139 136 134*  K 4.3 3.9 3.3* 3.2*  CL 100 100 102 106  CO2 26 24 20* 18*  GLUCOSE 141* 166* 159* 237*  BUN 10 34* 39* 45*  CREATININE 0.47 1.29* 1.38* 1.71*  CALCIUM 9.6 9.6 8.5* 7.3*  PROT 7.4 7.9  --  4.6*  ALBUMIN 4.1 4.3  --  2.5*  AST 37 43*  --  59*  ALT 26 27  --  45*  ALKPHOS 101 105  --  59  BILITOT 0.9 1.0  --  0.2*  GFRNONAA >60 44* 40* 31*  ANIONGAP _0 Hematology Recent Labs  Lab 03/05/2021 1306 03/14/21 0122 03/15/21 0538  WBC 13.7* 14.8* 14.0*  RBC 4.87 3.83* 3.31*  HGB 16.1* 12.4 10.8*  HCT 47.1* 36.8 33.0*  MCV 96.7 96.1 99.7  MCH 33.1 32.4 32.6  MCHC 34.2 33.7 32.7  RDW 13.2 13.2 14.0  PLT 197 180 142*   BNPNo results for input(s): BNP, PROBNP in the last 168 hours.  DDimer No results for input(s): DDIMER in the last 168 hours.   Radiology  CT Head Wo Contrast  Result Date: 03/25/2021 CLINICAL DATA:  Mental status changes EXAM: CT HEAD WITHOUT CONTRAST TECHNIQUE: Contiguous axial images were obtained from the base of the skull through the vertex without intravenous contrast. COMPARISON:  03/12/2021 FINDINGS: Brain: There is atrophy and chronic small vessel disease changes. Old right basal ganglia lacunar infarct. No acute intracranial abnormality. Specifically, no hemorrhage, hydrocephalus, mass lesion, acute infarction, or significant intracranial injury. Vascular: No hyperdense vessel or unexpected calcification. Skull: No acute calvarial abnormality. Sinuses/Orbits: Air-fluid level in the right maxillary sinus. Other: None IMPRESSION: No significant  change since prior study.  No acute findings. Electronically Signed   By: Rolm Baptise M.D.   On: 03/29/2021 16:06   DG CHEST PORT 1 VIEW  Result Date: 03/14/2021 CLINICAL DATA:  Respiratory failure EXAM: PORTABLE CHEST 1 VIEW COMPARISON:  03/19/2021 FINDINGS: Endotracheal tube terminates 4.1 cm above the carina. Left IJ venous catheter terminates in the mid SVC. Mild right basilar atelectasis. Left lung is clear. No pleural effusion or pneumothorax. Enteric tube courses into the proximal stomach with its side  port at the GE junction. IMPRESSION: Endotracheal tube terminates 4.1 cm above the carina. Left IJ venous catheter terminates in the mid SVC. Electronically Signed   By: Julian Hy M.D.   On: 03/14/2021 02:43   DG Chest Port 1 View  Result Date: 03/27/2021 CLINICAL DATA:  Acute respiratory failure with hypoxia EXAM: PORTABLE CHEST 1 VIEW COMPARISON:  03/07/2021 at 1610 hours FINDINGS: Minimal bibasilar atelectasis.  No pleural effusion or pneumothorax. The heart is normal in size. Endotracheal tube terminates 7 mm above the carina. Enteric tube courses into the proximal stomach with its side port at the GE junction. IMPRESSION: Minimal bibasilar atelectasis. Support apparatus as above. Electronically Signed   By: Julian Hy M.D.   On: 03/05/2021 19:20   DG Chest Portable 1 View  Result Date: 03/26/2021 CLINICAL DATA:  Post intubation, OG tube placement EXAM: PORTABLE CHEST 1 VIEW COMPARISON:  03/09/2021 FINDINGS: Endotracheal tube is 1.2 cm above the carina. NG tube is in the stomach. Minimal right base atelectasis. Left lung clear. Heart is normal size. No effusions or acute bony abnormality. IMPRESSION: Minimal right base atelectasis. Electronically Signed   By: Rolm Baptise M.D.   On: 03/11/2021 16:18   DG Chest Port 1 View  Result Date: 03/17/2021 CLINICAL DATA:  Sepsis.  Altered mental status.  Fever. EXAM: PORTABLE CHEST 1 VIEW COMPARISON:  09/26/2020 FINDINGS: The patient  is rotated to the right on today's radiograph, reducing diagnostic sensitivity and specificity. Atherosclerotic calcification of the aortic arch. Heart size within normal limits. No blunting of the costophrenic angles. Mild degenerative glenohumeral arthropathy on the left. Linear subsegmental atelectasis or scarring at the right lung base. IMPRESSION: 1. Subsegmental atelectasis at the right lung base; no compelling findings for active pneumonia. 2.  Aortic Atherosclerosis (ICD10-I70.0). Electronically Signed   By: Van Clines M.D.   On: 03/02/2021 13:42   ECHOCARDIOGRAM COMPLETE  Result Date: 03/14/2021    ECHOCARDIOGRAM REPORT   Patient Name:   [name] Date of Exam: 03/14/2021 Medical Rec #:  [id number]       Height:       67.0 in Accession #:    [id number]      Weight:       114.6 lb Date of Birth:  1946/08/13       BSA:          1.596 m Patient Age:    66 years        BP:           89/64 mmHg Patient Gender: F               HR:           79 bpm. Exam Location:  Inpatient Procedure: 2D Echo, Cardiac Doppler and Color Doppler Indications:    Abnormal ECG R94.31  History:        Patient has prior history of Echocardiogram examinations, most                 recent 11/03/2016. CHF; Risk Factors:Diabetes and Current Smoker.                 Hepatitis A, Hepatitis B, Hepatitis C.  Sonographer:    Darlina Sicilian RDCS Referring Phys: [id number] [place]  Sonographer Comments: Echo performed with patient supine and on artificial respirator. Image acquisition challenging due to respiratory motion. IMPRESSIONS  1. Left ventricular ejection fraction, by estimation, is 60 to 65%. The left ventricle has normal function.  The left ventricle has no regional wall motion abnormalities. There is mild concentric left ventricular hypertrophy. Left ventricular diastolic parameters are indeterminate.  2. Right ventricular systolic function is normal. The right ventricular size is normal. There is normal pulmonary  artery systolic pressure.  3. The mitral valve is normal in structure. No evidence of mitral valve regurgitation. Moderate mitral annular calcification.  4. Tricuspid valve regurgitation is mild to moderate.  5. The aortic valve is tricuspid. Aortic valve regurgitation is not visualized. Mild aortic valve sclerosis is present, with no evidence of aortic valve stenosis. FINDINGS  Left Ventricle: Left ventricular ejection fraction, by estimation, is 60 to 65%. The left ventricle has normal function. The left ventricle has no regional wall motion abnormalities. The left ventricular internal cavity size was normal in size. There is  mild concentric left ventricular hypertrophy. Left ventricular diastolic parameters are indeterminate. Right Ventricle: The right ventricular size is normal. No increase in right ventricular wall thickness. Right ventricular systolic function is normal. There is normal pulmonary artery systolic pressure. The tricuspid regurgitant velocity is 1.95 m/s, and  with an assumed right atrial pressure of 15 mmHg, the estimated right ventricular systolic pressure is 16.5 mmHg. Left Atrium: Left atrial size was normal in size. Right Atrium: Right atrial size was normal in size. Pericardium: There is no evidence of pericardial effusion. Mitral Valve: The mitral valve is normal in structure. Moderate mitral annular calcification. No evidence of mitral valve regurgitation. Tricuspid Valve: The tricuspid valve is normal in structure. Tricuspid valve regurgitation is mild to moderate. Aortic Valve: The aortic valve is tricuspid. Aortic valve regurgitation is not visualized. Mild aortic valve sclerosis is present, with no evidence of aortic valve stenosis. Pulmonic Valve: The pulmonic valve was normal in structure. Pulmonic valve regurgitation is mild. Aorta: The aortic root and ascending aorta are structurally normal, with no evidence of dilitation. Venous: IVC assessment for right atrial pressure unable  to be performed due to mechanical ventilation. IAS/Shunts: No atrial level shunt detected by color flow Doppler.  LEFT VENTRICLE PLAX 2D LVIDd:         3.10 cm     Diastology LVIDs:         2.50 cm     LV e' medial:    3.66 cm/s LV PW:         1.20 cm     LV E/e' medial:  11.2 LV IVS:        1.20 cm     LV e' lateral:   5.30 cm/s LVOT diam:     1.70 cm     LV E/e' lateral: 7.7 LV SV:         13 LV SV Index:   8 LVOT Area:     2.27 cm  LV Volumes (MOD) LV vol d, MOD A4C: 29.3 ml LV SV MOD A4C:     29.3 ml LEFT ATRIUM           Index LA diam:      3.00 cm 1.88 cm/m LA Vol (A2C): 21.0 ml 13.16 ml/m LA Vol (A4C): 16.7 ml 10.46 ml/m  AORTIC VALVE LVOT Vmax:   55.05 cm/s LVOT Vmean:  33.600 cm/s LVOT VTI:    0.057 m  AORTA Ao Root diam: 3.30 cm Ao Asc diam:  3.10 cm MITRAL VALVE               TRICUSPID VALVE MV Area (PHT): 3.38 cm    TR Peak grad:  15.2 mmHg MV Decel Time: 225 msec    TR Vmax:        195.00 cm/s MV E velocity: 40.95 cm/s MV A velocity: 42.52 cm/s  SHUNTS MV E/A ratio:  0.96        Systemic VTI:  0.06 m                            Systemic Diam: 1.70 cm Dani Gobble Croitoru MD Electronically signed by Sanda Klein MD Signature Date/Time: 03/14/2021/2:50:30 PM    Final    CT Renal Stone Study  Result Date: 03/27/2021 CLINICAL DATA:  Sepsis, urinary tract infections EXAM: CT ABDOMEN AND PELVIS WITHOUT CONTRAST TECHNIQUE: Multidetector CT imaging of the abdomen and pelvis was performed following the standard protocol without IV contrast. COMPARISON:  02/14/2013 FINDINGS: Lower chest: Mild scarring or atelectasis in the right lower lobe. Circumflex and right coronary artery and descending thoracic aortic atherosclerotic calcification. Hepatobiliary: Unremarkable Pancreas: Unremarkable Spleen: Unremarkable Adrenals/Urinary Tract: Low-density fullness of both adrenal glands without mass identified. Vascular calcification along the left renal hilum, image 74 series 5. Punctate 1-2 mm right kidney upper pole  calcification on image 84 series 5, probably a renal calculus. Borderline right hydronephrosis without ureteral calculus or specific cause identified. Stomach/Bowel: Descending and sigmoid colon diverticulosis without findings of active diverticulitis. Circumferential wall thickening in the lower rectum extending to the anorectal junction for example on image 77 series 2, cannot exclude inflammation or tumor, correlate with digital rectal exam findings. Vascular/Lymphatic: Aortoiliac atherosclerotic vascular disease. Reproductive: Posterior uterine body fibroid. Other: Cutaneous and subcutaneous edema posterolateral to the right hip on image 87 series 2. Musculoskeletal: Severe bilateral hip arthropathy with protrusio. Lumbar spondylosis and degenerative disc disease causing multilevel impingement. Mild grade 1 degenerative retrolisthesis at L2-3. Small indirect right inguinal hernia contains adipose tissue. IMPRESSION: 1. There borderline right hydronephrosis without ureteral calculus or visible cause. 2. Punctate 1-2 mm right kidney upper pole nonobstructive renal calculus. 3. Wall thickening in the lower rectum and anorectal junction, inflammation or tumor not excluded. 4.  Aortic Atherosclerosis (ICD10-I70.0).  Coronary atherosclerosis. 5. Descending and sigmoid colon diverticulosis. 6. Uterine fibroid. 7. Cutaneous and subcutaneous edema overlying the right hip, cause uncertain. 8. Severe bilateral hip arthropathy with protrusio. 9. Multilevel lumbar impingement. Electronically Signed   By: Van Clines M.D.   On: 03/11/2021 14:25    Cardiac Studies  03/14/2021  1. Left ventricular ejection fraction, by estimation, is 60 to 65%. The  left ventricle has normal function. The left ventricle has no regional  wall motion abnormalities. There is mild concentric left ventricular  hypertrophy. Left ventricular diastolic  parameters are indeterminate.   2. Right ventricular systolic function is normal. The  right ventricular  size is normal. There is normal pulmonary artery systolic pressure.   3. The mitral valve is normal in structure. No evidence of mitral valve  regurgitation. Moderate mitral annular calcification.   4. Tricuspid valve regurgitation is mild to moderate.   5. The aortic valve is tricuspid. Aortic valve regurgitation is not  visualized. Mild aortic valve sclerosis is present, with no evidence of  aortic valve stenosis.    Patient Profile  [name] is a 75 y.o. female with history of dementia, schizophrenia, seizure disorder who was admitted on 03/11/2021 with fever altered mental status and status epilepticus.  Cardiology was consulted on 03/14/2021 with concern for ST elevation myocardial infarction.  Course has been complicated by refractory  seizures despite several antiepileptic medications.  Assessment & Plan   Elevated troponin/non-STEMI -Transferred from [place] for status epilepticus.  Continues to have refractory seizures.  This is despite several antiepileptic agents.  She is on propofol.  She is critically ill with persistent lactic acidosis. -Cardiology was consulted on 03/14/2021 with concern for ST elevation myocardial infarction around 1:20 AM on this date. -I have reviewed her EKGs.  She does have isolated ST elevation in V1 and V2.  She has no reciprocal ST depressions.  Troponin elevated at 1665 and is trending down. -Echocardiogram shows normal LV function without evidence of wall motion abnormality. -I have reviewed prior EKGs.  She does have baseline ST elevation in these leads.  I suspect this could be hypertensive related. -Regardless given her ongoing seizures and status epilepticus she is not a candidate for cardiac catheterization.  I do fear she has a very poor prognosis as she has had persistent seizures for nearly 36 hours while here at [facility]. -I would recommend medical management.  She is not a candidate for invasive angiography.   Unclear if she will become a candidate for this. -We will continue aspirin 81 mg daily.  We will continue heparin drip for 48 hours. -She remains on pressors.  Would not recommend a beta-blocker. -Again, I think her troponin elevation is all secondary to her critical illness.  It is reassuring her LV function is normal however her overall neurological prognosis is likely poor. -The cardiology service will follow along remotely.  Please notify us if her condition improves and we can reevaluate her candidacy for invasive angiography.  However there is no urgency to this.  CRITICAL CARE Performed by: Lake Bells T O'Neal  Total critical care time: 40 minutes. Critical care time was exclusive of separately billable procedures and treating other patients. Critical care was necessary to treat or prevent imminent or life-threatening deterioration. Critical care was time spent personally by me on the following activities: development of treatment plan with patient and/or surrogate as well as nursing, discussions with consultants, evaluation of patient's response to treatment, examination of patient, obtaining history from patient or surrogate, ordering and performing treatments and interventions, ordering and review of laboratory studies, ordering and review of radiographic studies, pulse oximetry and re-evaluation of patient's condition.  [facility] will sign off.   Medication Recommendations: Medical management as above Other recommendations (labs, testing, etc): None Follow up as an outpatient: None needed.  Please notify us if her neurologic status improves.  For questions or updates, please contact [place] Please consult [email or web address] for contact info under     Signed, Lake Bells T. Audie Box, MD, [place]  03/15/2021 7:42 AM

## 2021-03-16 DIAGNOSIS — R569 Unspecified convulsions: Secondary | ICD-10-CM | POA: Diagnosis not present

## 2021-03-16 DIAGNOSIS — J9600 Acute respiratory failure, unspecified whether with hypoxia or hypercapnia: Secondary | ICD-10-CM | POA: Diagnosis not present

## 2021-03-16 DIAGNOSIS — G40901 Epilepsy, unspecified, not intractable, with status epilepticus: Secondary | ICD-10-CM | POA: Diagnosis not present

## 2021-03-16 LAB — BASIC METABOLIC PANEL
Anion gap: 3 — ABNORMAL LOW (ref 5–15)
BUN: 32 mg/dL — ABNORMAL HIGH (ref 8–23)
CO2: 19 mmol/L — ABNORMAL LOW (ref 22–32)
Calcium: 7.7 mg/dL — ABNORMAL LOW (ref 8.9–10.3)
Chloride: 115 mmol/L — ABNORMAL HIGH (ref 98–111)
Creatinine, Ser: 1.1 mg/dL — ABNORMAL HIGH (ref 0.44–1.00)
GFR, Estimated: 53 mL/min — ABNORMAL LOW (ref >60)
Glucose, Bld: 177 mg/dL — ABNORMAL HIGH (ref 70–99)
Potassium: 5 mmol/L (ref 3.5–5.1)
Sodium: 137 mmol/L (ref 135–145)

## 2021-03-16 LAB — COMPREHENSIVE METABOLIC PANEL
ALT: 54 U/L — ABNORMAL HIGH (ref 0–44)
AST: 56 U/L — ABNORMAL HIGH (ref 15–41)
Albumin: 2.1 g/dL — ABNORMAL LOW (ref 3.5–5.0)
Alkaline Phosphatase: 58 U/L (ref 38–126)
Anion gap: 5 (ref 5–15)
BUN: 26 mg/dL — ABNORMAL HIGH (ref 8–23)
CO2: 18 mmol/L — ABNORMAL LOW (ref 22–32)
Calcium: 7.8 mg/dL — ABNORMAL LOW (ref 8.9–10.3)
Chloride: 116 mmol/L — ABNORMAL HIGH (ref 98–111)
Creatinine, Ser: 1.07 mg/dL — ABNORMAL HIGH (ref 0.44–1.00)
GFR, Estimated: 55 mL/min — ABNORMAL LOW (ref >60)
Glucose, Bld: 251 mg/dL — ABNORMAL HIGH (ref 70–99)
Potassium: 4.6 mmol/L (ref 3.5–5.1)
Sodium: 139 mmol/L (ref 135–145)
Total Bilirubin: 0.4 mg/dL (ref 0.3–1.2)
Total Protein: 4.7 g/dL — ABNORMAL LOW (ref 6.5–8.1)

## 2021-03-16 LAB — CBC
HCT: 31.1 % — ABNORMAL LOW (ref 36.0–46.0)
Hemoglobin: 10.2 g/dL — ABNORMAL LOW (ref 12.0–15.0)
MCH: 33 pg (ref 26.0–34.0)
MCHC: 32.8 g/dL (ref 30.0–36.0)
MCV: 100.6 fL — ABNORMAL HIGH (ref 80.0–100.0)
Platelets: 138 10*3/uL — ABNORMAL LOW (ref 150–400)
RBC: 3.09 MIL/uL — ABNORMAL LOW (ref 3.87–5.11)
RDW: 14.6 % (ref 11.5–15.5)
WBC: 10.7 10*3/uL — ABNORMAL HIGH (ref 4.0–10.5)
nRBC: 0 % (ref 0.0–0.2)

## 2021-03-16 LAB — URINE CULTURE: Culture: 30000 — AB

## 2021-03-16 LAB — PHENYTOIN LEVEL, TOTAL: Phenytoin Lvl: 13.2 ug/mL (ref 10.0–20.0)

## 2021-03-16 LAB — GLUCOSE, CAPILLARY
Glucose-Capillary: 185 mg/dL — ABNORMAL HIGH (ref 70–99)
Glucose-Capillary: 266 mg/dL — ABNORMAL HIGH (ref 70–99)
Glucose-Capillary: 203 mg/dL — ABNORMAL HIGH (ref 70–99)

## 2021-03-16 LAB — LACTIC ACID, PLASMA: Lactic Acid, Venous: 1 mmol/L (ref 0.5–1.9)

## 2021-03-16 LAB — CULTURE, BLOOD (ROUTINE X 2): Special Requests: ADEQUATE

## 2021-03-16 LAB — PHENYTOIN LEVEL, FREE AND TOTAL
Phenytoin, Free: NOT DETECTED ug/mL (ref 1.0–2.0)
Phenytoin, Total: 1.1 ug/mL — ABNORMAL LOW (ref 10.0–20.0)

## 2021-03-16 LAB — TRIGLYCERIDES: Triglycerides: 120 mg/dL (ref <150)

## 2021-03-16 LAB — HEPARIN LEVEL (UNFRACTIONATED): Heparin Unfractionated: 0.44 IU/mL (ref 0.30–0.70)

## 2021-03-16 MED ORDER — POLYETHYLENE GLYCOL 3350 17 G PO PACK
17.0000 g | PACK | Freq: Every day | ORAL | Status: DC
  Administered 2021-03-16: 17 g
  Filled 2021-03-16: qty 1

## 2021-03-16 MED ORDER — DOCUSATE SODIUM 50 MG/5ML PO LIQD
100.0000 mg | Freq: Two times a day (BID) | ORAL | Status: DC
  Administered 2021-03-16: 100 mg

## 2021-03-16 MED ORDER — NOREPINEPHRINE 4 MG/250ML-% IV SOLN
INTRAVENOUS | Status: AC
  Administered 2021-03-16: 13 ug/min
  Filled 2021-03-16: qty 250

## 2021-03-16 MED ORDER — DEXTROSE 5 % IV SOLN: INTRAVENOUS | Status: DC

## 2021-03-16 MED ORDER — GLYCOPYRROLATE 1 MG PO TABS: 1.0000 mg | ORAL_TABLET | ORAL | Status: DC | PRN

## 2021-03-16 MED ORDER — ACETAMINOPHEN 325 MG PO TABS: 650.0000 mg | ORAL_TABLET | Freq: Four times a day (QID) | ORAL | Status: DC | PRN

## 2021-03-16 MED ORDER — PERAMPANEL 2 MG PO TABS: 8.0000 mg | ORAL_TABLET | Freq: Every day | ORAL | Status: DC

## 2021-03-16 MED ORDER — GLYCOPYRROLATE 0.2 MG/ML IJ SOLN: 0.2000 mg | INTRAMUSCULAR | Status: DC | PRN

## 2021-03-16 MED ORDER — SODIUM CHLORIDE 0.9 % IV SOLN
700.0000 mg | Freq: Once | INTRAVENOUS | Status: AC
  Administered 2021-03-16: 700 mg via INTRAVENOUS
  Filled 2021-03-16: qty 5.38

## 2021-03-16 MED ORDER — HEPARIN SODIUM (PORCINE) 5000 UNIT/ML IJ SOLN: 5000.0000 [IU] | Freq: Three times a day (TID) | INTRAMUSCULAR | Status: DC

## 2021-03-16 MED ORDER — INSULIN ASPART 100 UNIT/ML IJ SOLN
4.0000 [IU] | INTRAMUSCULAR | Status: DC
  Administered 2021-03-16: 4 [IU] via SUBCUTANEOUS

## 2021-03-16 MED ORDER — PHENOBARBITAL SODIUM 65 MG/ML IJ SOLN
65.0000 mg | Freq: Two times a day (BID) | INTRAMUSCULAR | Status: DC
  Administered 2021-03-16: 65 mg via INTRAVENOUS
  Filled 2021-03-16: qty 1

## 2021-03-16 MED ORDER — ACETAMINOPHEN 650 MG RE SUPP: 650.0000 mg | Freq: Four times a day (QID) | RECTAL | Status: DC | PRN

## 2021-03-16 MED ORDER — DIPHENHYDRAMINE HCL 50 MG/ML IJ SOLN: 25.0000 mg | INTRAMUSCULAR | Status: DC | PRN

## 2021-03-16 MED ORDER — POLYVINYL ALCOHOL 1.4 % OP SOLN
1.0000 [drp] | Freq: Four times a day (QID) | OPHTHALMIC | Status: DC | PRN
  Filled 2021-03-16: qty 15

## 2021-03-17 LAB — CULTURE, RESPIRATORY W GRAM STAIN: Culture: NORMAL

## 2021-03-18 LAB — CULTURE, BLOOD (ROUTINE X 2): Culture: NO GROWTH

## 2021-03-19 LAB — CULTURE, BLOOD (ROUTINE X 2): Culture: NO GROWTH

## 2021-03-30 NOTE — Progress Notes (Signed)
Pt extubed at 1616 by RT. Patient heart rhythm went to asystole on monitor at 1625.  No heart sounds verified by 2 RNs, Jeromiah Ohalloran and Ginger.

## 2021-03-30 NOTE — Procedures (Signed)
Extubation Procedure Note  Patient Details:   Name: ONELIA CADMUS DOB: [birth year] MRN: [id number]   Airway Documentation:    Vent end date: 03/28/2021 Vent end time: 1620   Evaluation  O2 sats: currently acceptable Complications: No apparent complications Patient did tolerate procedure well. Bilateral Breath Sounds: Clear, Diminished   No  One way extubation  Illias Pantano 03/11/2021, 4:35 PM

## 2021-03-30 NOTE — Plan of Care (Signed)
  Interdisciplinary Goals of Care Family Meeting   Date carried out:: 2021/04/11  Location of the meeting: Phone conversation    Member's involved: Nurse Practitioner and Other: Legal Gardian: Modena Jansky Power of Attorney or acting medical decision maker: Lenn Sink     Discussion: We discussed goals of care for [name] .  Despite all medical interventions [name] continues to remain in refractory status epilepticus (continuous seizures). She is currently receiving multiple antiepileptic (medications to prevent seizures) including; phenytoin, levetiracetam, perampanel, lamotrigine, lacosamide, and phenobarbital. She is additionally on two continuous drip Versed and propofol but unfortunately she continues to seize.   Given the prolonged seizures there is concern for poor neurological recovery this coupled with a history of multiple comorbidities and advanced age, the medical team does not think that [name] will survive this hospitalization with a meaningful recovery. Therefore, both Critical care and neurology recommend transitioning to comfort care and compassionate extubation.   I contacted [name], patient legal decision maker, and relayed the above concerns. All questions were answered. [name] made the medical decision on [name] behalf to proceed with DNR order, comfort measure, and one way extubation.    Theses orders will be placed.  [name] request that she be notified when [name] passes. Her contact number is [phone]  Code status: Full DNR  Disposition: In-patient comfort care  Time spent for the meeting: 67   [name] D. Tiburcio Pea, NP-C [facility] Personal contact information can be found on Amion  04/11/21, 12:44 PM

## 2021-03-30 NOTE — Progress Notes (Signed)
NAME:  [name], MRN:  [id number], DOB:  Jul 26, 1946, LOS: 3 ADMISSION DATE:  March 28, 2021, CONSULTATION DATE:  7/15 REFERRING MD:  Katrinka Blazing, CHIEF COMPLAINT:  vent management and critical care in setting of status epilepticus   History of Present Illness:  75 year old female patient who resides at a skilled nursing facility due to cognitive impairments as a consequences of schizophrenia, and dementia.  It is unclear what her functional status is otherwise.  Initially brought to the emergency room on 7/14 after having a witnessed single episode of seizure found to have a urinary tract infection, no further seizure witnessed, sent back to skilled nursing facility on Keflex.  Apparently continued to be in encephalopathic after returning to nursing home, had fever of 101.6, as well as  new tachycardia and was sent back to the emergency room for evaluation.  Was to be admitted with a working diagnosis of urinary tract sepsis and treated with antibiotics but subsequently had witnessed seizure while in the emergency room this consisted primarily of right gaze deviation, right face and neck arm twitching, followed by worsening mental status.  She was administered benzodiazepines, IV Keppra, and subsequently intubated for airway protection and transferred to [facility] for neuro critical care services and continuous EEG monitoring.  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  Hypertension, asthma.  Prior stroke.  Hyperlipidemia.  Resides at skilled nursing facility.  Significant Hospital Events:  7/14: Seen in emergency room for seizure, diagnosed with urinary tract infection, sent back to nursing home on Keflex 7/15: Persistent encephalopathy, new fever and tachycardia presented back to the emergency room with working diagnosis of of urosepsis but  subsequently had several witnessed seizures, and loss of airway protection requiring intubation transferred from [facility] [facility] to [facility] for neuro critical care services and continuous EEG monitoring 7/16 Overnight Cardiology consulted for concern for STEMI. Interventional cardiology reviewed case and cath deferred d/t clinical instability 7/18 EEG continues to display seizure activity overnight, phenobarbital added   Interim History / Subjective:  Deeply sedated on vent   Objective   Blood pressure (!) 123/56, pulse 72, temperature 98.42 F (36.9 C), resp. rate 16, height 5\' 7"  (1.702 m), weight 74.8 kg, SpO2 96 %.    Vent Mode: PRVC FiO2 (%):  [40 %] 40 % Set Rate:  [16 bmp] 16 bmp Vt Set:  [490 mL-510 mL] 490 mL PEEP:  [5 cmH20] 5 cmH20 Plateau Pressure:  [19 cmH20-21 cmH20] 21 cmH20   Intake/Output Summary (Last 24 hours) at 03/29/2021 0927 Last data filed at 03/15/2021 03/18/2021 Gross per 24 hour  Intake 6154.04 ml  Output 3775 ml  Net 2379.04 ml    Filed Weights   03/15/21 0500 03/15/21 0800 03/09/2021 0500  Weight: 70.9 kg 69.7 kg 74.8 kg   Physical Exam: General: Chronically ill appearing elderly female lying in bed on mechanical ventilation, in NAD HEENT: ETT, MM pink/moist, PERRL,  Neuro: Deeply sedated  CV: s1s2 regular rate and rhythm, no murmur, rubs, or gallops,  PULM:  Clear to ascultation, diminished to left, no added breath sounds  GI: soft, bowel sounds active in all 4 quadrants, non-tender, non-distended, tolerating TF Extremities: warm/dry, no edema  Skin: no rashes or lesions  Trop peak 1665 CK 543 LA 2.7 WBC 14.8 Phenytoin leve <2.5 subtherapeutic UA +leuk, nitrites  Worsening Cr with metabolic acidosis Unchanged leukocytosis  Resolved  Hospital Problem list     Assessment & Plan:  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   -On 5 anticonvulsants currently.  Suspect this is breakthrough from acute urinary tract  infection, also has fever both of which could decrease seizure threshold. -Phenytoin found subtherapeutic  Acute metabolic encephalopathy secondary to seizure Superimposed on history of underlying dementia and schizophrenia.   -Unclear what her baseline functional and cognitive status is by chart review -7/17 EEG lateralized left hemisphere highly epileptiform bursts which is on the ictal-interictal continuum with high potential for seizure recurrence.  P: Primary management per neurology  Maintain neuro protective measures; goal for eurothermia, euglycemia, eunatermia, normoxia, and PCO2 goal of 35-40 Nutrition and bowel regiment  Seizure precautions  Continuous EEG AEDs per neurology  Aspirations precautions   Acute hypoxic respiratory failure in the setting of ineffective airway protection and possible aspiration. P: Continue ventilator support with lung protective strategies  Wean PEEP and FiO2 for sats greater than 90%. Head of bed elevated 30 degrees. Plateau pressures less than 30 cm H20.  Follow intermittent chest x-ray and ABG.   SAT/SBT as tolerated, mentation preclude extubation  Ensure adequate pulmonary hygiene  Follow cultures  VAP bundle in place  PAD protocol  Severe sepsis/septic shock as evidenced by lactic acidosis in the setting of urinary tract infection  Hypotension secondary to sedation -Cultures have been sent Lactic acidosis -Suspect this is multifactorial: Seizure, and possible sepsis. P: Follow cultures  Remains on Ceftriaxone  Pressors for MAP goal > 65 Treat underlying cause   Acute kidney injury: Secondary to shock P: Follow renal function  Monitor urine output Trend Bmet Avoid nephrotoxins Ensure adequate renal perfusion   NSTEMI -Reviewed EKG with cardiology. Minimal ST elevation in V1 and V2 with no reciprocal changes. Prior EKGs demonstrated more pronounced changes in setting of critical illness. Echo also reassuring with normal EF and  no WMA or significant valvular disease -No a candidate for cardiac cath P: Continue medical management  S/P 48hrs IV heparin drip, stopped 7/18 Continue ASA and Statin  Appreciate cardiology input   Hypokalemia Hypomag P: Supplement as needed   Diabetes with hyperglycemia P: Continue SSI  Add tube feed coverage  CBG q 4hrs   Mild anemia w/out evidence of bleeding P: Trend CBC   GOC Need to establish who is patients decision maker to determine appropriate GOC   Best Practice    Diet/type: tubefeeds DVT prophylaxis: prophylactic heparin  GI prophylaxis: N/A Lines: Central line Foley:  N/A Code Status:  full code Last date of multidisciplinary goals of care discussion: Pending    Critical care time:    Performed by: Josiyah Tozzi D. Harris  Total critical care time: 40 minutes  Critical care time was exclusive of separately billable procedures and treating other patients.  Critical care was necessary to treat or prevent imminent or life-threatening deterioration.  Critical care was time spent personally by me on the following activities: development of treatment plan with patient and/or surrogate as well as nursing, discussions with consultants, evaluation of patient's response to treatment, examination of patient, obtaining history from patient or surrogate, ordering and performing treatments and interventions, ordering and review of laboratory studies, ordering and review of radiographic studies, pulse oximetry and re-evaluation of patient's condition.  Iwalani Templeton D. Tiburcio Pea, NP-C [facility] Personal contact information can be found on Amion  03/15/2021, 9:57 AM

## 2021-03-30 NOTE — Death Summary Note (Signed)
  DEATH SUMMARY   Patient Details  Name: [name] MRN: [id number] DOB: [birth year]  Admission/Discharge Information   Admit Date:  2021-03-29  Date of Death: Date of Death: 2021-04-01  Time of Death: Time of Death: 12/23/23  Length of Stay: 3  Referring Physician: Marguarite Arbour, MD   Reason(s) for Hospitalization  Acute encephalopathy secondary to status epilepticus Sepsis secondary to urinary tract infection, present on admission ST elevation MI  Diagnoses  Preliminary cause of death:  Acute encephalopathy secondary to status epilepticus  Secondary Diagnoses (including complications and co-morbidities):  Active Problems:   Status epilepticus (HCC)   Acute respiratory failure (HCC)   Diabetes mellitus without complication (HCC)   Septic shock (HCC)   UTI (urinary tract infection)   Seizures (HCC)   Hyperlipidemia   Dementia (HCC)   Acute delirium   Chronic diastolic heart failure (HCC)   Schizophrenia (HCC)   Seizure [facility])   Brief Hospital Course (including significant findings, care, treatment, and services provided and events leading to death)  [name] is a 75 y.o. year old female patient who resides at a skilled nursing facility due to cognitive impairments as a consequences of schizophrenia, and dementia.  It is unclear what her functional status is otherwise.  Initially brought to the emergency room on 7/14 after having a witnessed single episode of seizure found to have a urinary tract infection, no further seizure witnessed, sent back to skilled nursing facility on Keflex.   Apparently continued to be in encephalopathic after returning to nursing home, had fever of 101.6, as well as  new tachycardia and was sent back to the emergency room for evaluation.  Was to be admitted with a working diagnosis of urinary tract sepsis and treated with antibiotics but subsequently had witnessed seizure while in the emergency room this consisted primarily of right gaze  deviation, right face and neck arm twitching, followed by worsening mental status.  She was administered benzodiazepines, IV Keppra, and subsequently intubated for airway protection and transferred to [facility] for neuro critical care services and continuous EEG monitoring  Evaluated by cardiology on 03/14/2021 with elevated troponins, EKG changes consistent with septal infarct.  Cardiology was consulted, invasive procedure deferred due to clinical instability and medical management recommended  Her status epilepticus was refractory even on propofol, Versed and 6 antiepileptics. Given the prolonged seizures there is concern for poor neurological recovery this coupled with a history of multiple comorbidities and advanced age, the medical team did not think that [name] will survive this hospitalization with a meaningful recovery. Therefore, both Critical care and neurology recommend transitioning to comfort care and compassionate extubation.  Discussed with her power of attorney as she is a ward of the state and decision was made to transition to comfort measures.  Family was also contacted who agreed with the decision She was terminally extubated and passed away shortly thereafter  Signature:   Chilton Greathouse MD [facility] 03/27/2021, 1:46 PM

## 2021-03-30 NOTE — Progress Notes (Signed)
Inpatient Diabetes Program Recommendations  AACE/ADA: New Consensus Statement on Inpatient Glycemic Control  Target Ranges:  Prepandial:   less than 140 mg/dL      Peak postprandial:   less than 180 mg/dL (1-2 hours)      Critically ill patients:  140 - 180 mg/dL   Results for [name], [name] (MRN [id number]) as of April 08, 2021 09:20  Ref. Range 03/15/2021 07:50 03/15/2021 11:17 03/15/2021 15:39 03/15/2021 19:43 03/15/2021 23:18 Apr 08, 2021 03:25 04-08-2021 07:47  Glucose-Capillary Latest Ref Range: 70 - 99 mg/dL 308 (H) 657 (H) 846 (H) 175 (H) 158 (H) 185 (H) 266 (H)    Review of Glycemic Control  Diabetes history: DM2 Outpatient Diabetes medications: None Current orders for Inpatient glycemic control: Novolog 0-20 units Q4H; Vital @ 50 ml/hr  Inpatient Diabetes Program Recommendations:    Insulin: Please consider ordering Novolog 4 units Q4H for tube feeding coverage. If tube feeding is stopped or held then Novolog tube feeding coverage should also be stopped or held.  Thanks, Orlando Penner, RN, MSN, CDE Diabetes Coordinator Inpatient Diabetes Program [phone] (Team Pager from 8am to 5pm)

## 2021-03-30 NOTE — Progress Notes (Signed)
150mg  of Versed wasted via stericycle RN/ Ginger Hinson,RN

## 2021-03-30 NOTE — Progress Notes (Signed)
eLink Physician-Brief Progress Note Patient Name: [name] DOB: 1946-06-13 MRN: [id number]   Date of Service  03/28/2021  HPI/Events of Note  BBB has now reverted to narrow QRS. K+ = 5.0 and Creatinine = 1.10. Ca++ = 7.7, howerver, Ionized Ca++ = 1.2 (Normal).   eICU Interventions  Plan: Repeat K+ in AM.      Intervention Category Major Interventions: Arrhythmia - evaluation and management  Tedford Berg Eugene 03/26/2021, 2:09 AM

## 2021-03-30 NOTE — Procedures (Signed)
Patient Name: [name]  MRN: [id number]  Epilepsy Attending: Charlsie Quest  Referring Physician/Provider: Dr Milon Dikes Duration: 03/15/2021 2001 to 2021-03-23 1315   Patient history: 75 year old with known history of seizures on 4 antiepileptics, coming in with breakthrough seizures in the setting of likely UTI.  EEG to evaluate for seizures.    Level of alertness:  comatose   AEDs during EEG study: Propofol, versed, LEV, LCM, PHT, LTG, Phenobarb   Technical aspects: This EEG study was done with scalp electrodes positioned according to the 10-20 International system of electrode placement. Electrical activity was acquired at a sampling rate of 500Hz  and reviewed with a high frequency filter of 70Hz  and a low frequency filter of 1Hz . EEG data were recorded continuously and digitally stored.   Description: EEG showed 2-5 seconds of generalized eeg suppression alternating with generalized and lateralized left hemisphere bursts of highly epileptiform discharges on average lasting 3-6 seconds. Intermittent brief low amplitude 15-18hz  generalized beta activity was also noted. Intermittent seizure without clinical signs were noted arising from left hemisphere, avg 4-5/hour lasting about 30 seconds to 2 minutes on average, last seizure at 0619 on 2021-03-23. Hyperventilation and photic stimulation were not performed.      ABNORMALITY - Seizure without clinical signs, left hemisphere  - Burst suppression with highly epileptiform discharges, generalized and lateralized left hemisphere   IMPRESSION: This study showed intermittent seizures without clinical signs arising from left hemisphere, avg 4-5/hour lasting about 30 seconds to 2 minutes on average, last seizure 0619 on 2021/03/23. There is also generalized and lateralized left hemisphere highly epileptiform bursts which is on the ictal-interictal continuum with high potential for seizure recurrence. Additionally, there is profound diffuse  encephalopathy, likely due to sedation, seizures.   EEG appears to be worsening compared to previous day.    Helyn Schwan 

## 2021-03-30 NOTE — Progress Notes (Addendum)
Subjective: Continues to have subclinical seizures overnight.   ROS: Unable to obtain due to poor mental status  Examination  Vital signs in last 24 hours: Temp:  [96.26 F (35.7 C)-98.96 F (37.2 C)] 96.26 F (35.7 C) (07/18 1117) Pulse Rate:  [60-79] 65 (07/18 1117) Resp:  [16-18] 16 (07/18 1117) BP: (91-123)/(48-74) 121/62 (07/18 1117) SpO2:  [96 %-99 %] 97 % (07/18 1117) FiO2 (%):  [40 %] 40 % (07/18 1117) Weight:  [74.8 kg] 74.8 kg (07/18 0500)  General: lying in bed, NAD CVS: pulse-normal rate and rhythm RS: Intubated, coarse breath sounds bilaterally Extremities: Flaccid, warm Neuro: On Versed at 60 mL/h, on propofol at 100 mcg/hour, comatose, does not open eyes to noxious stimuli, pupils small and I do not appreciate any reactivity, corneal reflex absent, gag reflex absent, flaccid, does not withdraw to noxious stimuli in upper extremities  Basic Metabolic Panel: Recent Labs  Lab 03/23/2021 1306 03/23/2021 1306 03/14/21 0122 03/14/21 1033 03/14/21 2015 03/15/21 0538 03/15/21 1106 03/15/21 1616 03/15/21 2327 03/15/21 2352 2021/03/25 0613  NA 139  --  136  --   --  134*  --   --  137 140 139  K 3.9  --  3.3*  --   --  3.2*  --   --  5.0 5.1 4.6  CL 100  --  102  --   --  106  --   --  115*  --  116*  CO2 24  --  20*  --   --  18*  --   --  19*  --  18*  GLUCOSE 166*  --  159*  --   --  237*  --   --  177*  --  251*  BUN 34*  --  39*  --   --  45*  --   --  32*  --  26*  CREATININE 1.29*  --  1.38*  --   --  1.71*  --   --  1.10*  --  1.07*  CALCIUM 9.6  --  8.5*  --   --  7.3*  --   --  7.7*  --  7.8*  MG  --    < > 1.7 1.5* 2.0 2.0 2.0 2.0  --   --   --   PHOS  --   --   --  4.6 4.0 4.0  --  3.1  --   --   --    < > = values in this interval not displayed.    CBC: Recent Labs  Lab 03/12/21 1118 03/26/2021 1306 03/14/21 0122 03/15/21 0538 03/15/21 2352 2021/03/25 0613  WBC 5.9 13.7* 14.8* 14.0*  --  10.7*  NEUTROABS  --  8.5*  --   --   --   --   HGB 14.1  16.1* 12.4 10.8* 10.9* 10.2*  HCT 42.4 47.1* 36.8 33.0* 32.0* 31.1*  MCV 98.1 96.7 96.1 99.7  --  100.6*  PLT 209 197 180 142*  --  138*     Coagulation Studies: Recent Labs    03/01/2021 1306  LABPROT 13.3  INR 1.0    Imaging  CT head without contrast 03/28/2021:No significant change since prior study.  No acute findings.    ASSESSMENT AND PLAN: 75 year old female with history of epilepsy who presented with nonconvulsive status epilepticus in the setting of UTI and possible medication noncompliance.   Refractory nonconvulsive status epilepticus Acute encephalopathy, likely infectious and due  to status epilepticus E Coli UTI Leukocytosis Anemia Thrombocytopenia AKI Transaminitis Hypoproteinemia with hypoalbuminemia -Patient currently has refractory status epilepticus even on propofol, Versed and 6 AEDs  Recommendations - Continue current dose of propofol@ 154mcg/hr and Versed @60ml /hr - Continue lacosamide 200 mg twice daily, Keppra 1000 mg twice daily, phenytoin 100 mg every 8 hours, phenobarbital 65 mg twice daily, lamotrigine 200 mg twice daily - Patient continues to have frequent seizures arising from the left hemisphere.  Patient was a resident of skilled nursing facility, has history of dementia, unclear baseline.  Prolonged refractory status epilepticus in a patient with poor neurological reserve will likely lead to significant neurological brain injury.  I am concerned that even if I continue to escalate her anti-epileptic regimen, patient will require trach/PEG and may not have any meaningful neurologic recovery. Unfortunately, patient is a ward of the state and no family members available to help with goals of care.  Discussed my concerns about severe neurological injury and poor recovery with Dr. . He will discuss with patient's guardian regarding further goals of care -In the interim, I will increase perampanel to 8 mg daily.  -We will also obtain MRI brain without  contrast to look for any other acute abnormalities -Management of rest of comorbidities per primary team  ADDENDUM -Critical care team discussed prognosis with patient's guardian who agrees with comfort care.   CRITICAL CARE Performed by: Isaiah Serge   Total critical care time: 35 minutes  Critical care time was exclusive of separately billable procedures and treating other patients.  Critical care was necessary to treat or prevent imminent or life-threatening deterioration.  Critical care was time spent personally by me on the following activities: development of treatment plan with patient and/or surrogate as well as nursing, discussions with consultants, evaluation of patient's response to treatment, examination of patient, obtaining history from patient or surrogate, ordering and performing treatments and interventions, ordering and review of laboratory studies, ordering and review of radiographic studies, pulse oximetry and re-evaluation of patient's condition.  Charlsie Quest Epilepsy [facility] For questions after 5pm please refer to AMION to reach the Neurologist on call

## 2021-03-30 NOTE — Progress Notes (Signed)
LTM EEG Disconnected. Slight blister at F8.

## 2021-03-30 NOTE — Progress Notes (Signed)
Patient Niece, Jesilyn Easom, was notified on medical decision to proceed with comfort care. She agrees with plan and confirms that all family members wish to ensure that Mrs. Riecke is comfortable moving forward.   Danaysha Kirn D. Tiburcio Pea, NP-C [facility] Personal contact information can be found on Amion  03/14/2021, 1:35 PM

## 2021-03-30 NOTE — Progress Notes (Signed)
Patient seen and EEG reviewed at 11: 20 PM. Had recurrent seizure activity on EEG but not clinically at 11:05 PM per RN. EEG reveals left sided seizure activity at 2309 lasting 1 minute, similar left sided seizure activity at 2311 lasting for 1 minute, then recurrence of left sided electrographic seizure activity at 2316 lasting for 8 minutes.   Propofol has been increased to 100 mcg/kg/min following bolus of 10 mg. Continuing Versed at a rate of 50.   20 minutes of ICU time.   Addendum:  - Paged by RN regarding recurrent seizure activity on EEG at 1:22 AM, lasting 2 minutes.  - Verified by personal review of EEG. Continues to have intermittent seizure activity on EEG - Will load with phenobarbital 10 mg/kg and continue with scheduled phenobarbital at 65 mg IV BID. Appreciate Pharmacy assistance.  - Will continue to monitor EEG.   Electronically signed: Dr. Caryl Pina

## 2021-03-30 DEATH — deceased

## 2022-08-14 IMAGING — CT CT RENAL STONE PROTOCOL
2 of 4 series · 15 of 46 positions shown, 17 images · non-contrast
Comparison: 02/14/2013

CLINICAL DATA: Sepsis, urinary tract infections

EXAM:
CT ABDOMEN AND PELVIS WITHOUT CONTRAST
TECHNIQUE: Multidetector CT imaging of the abdomen and pelvis was performed
following the standard protocol without IV contrast.

[Series 2: stone full standard · axial · 0.74mm/px · z∈[-609,-169]mm · 12 of 96 slices shown, 14 images]
[im 4/96  soft-tissue]
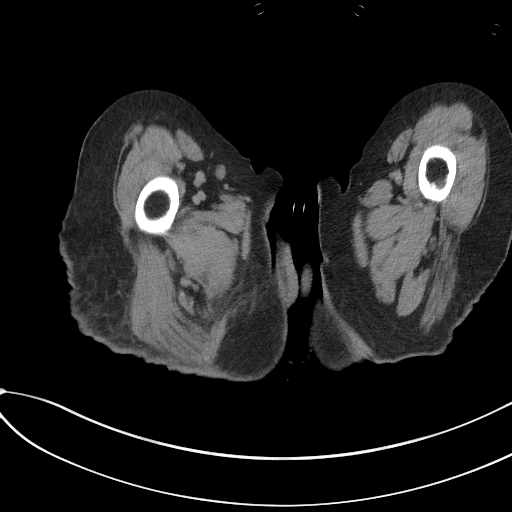
[im 4/96  bone]
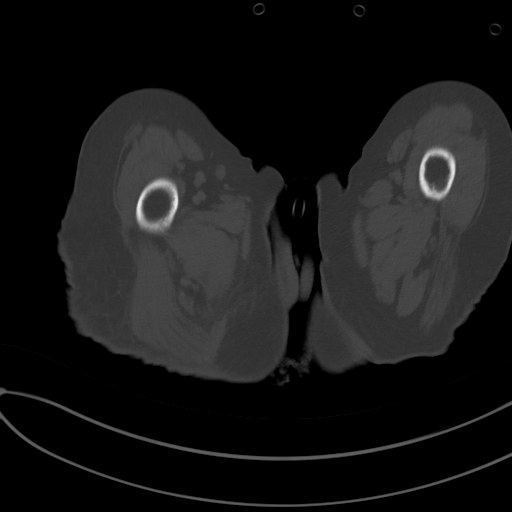
[im 12/96  soft-tissue]
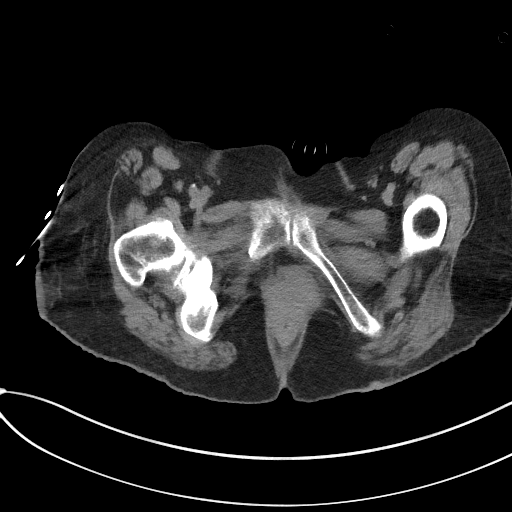
[im 20/96  soft-tissue]
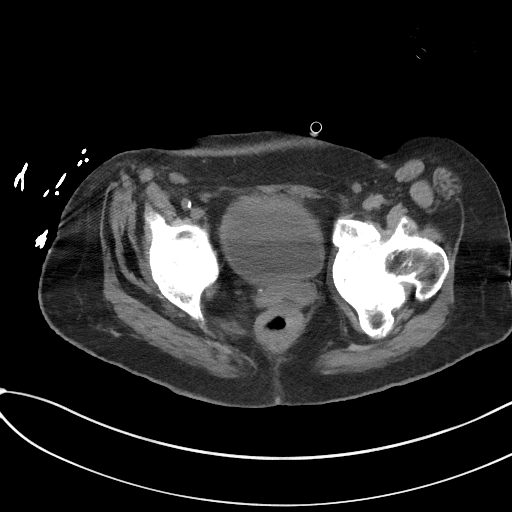
[im 28/96  soft-tissue]
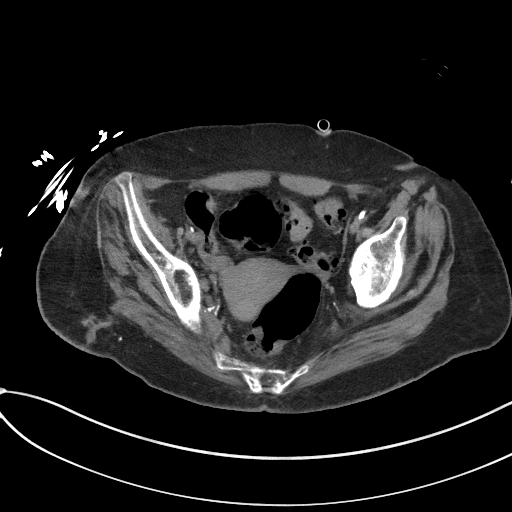
[im 36/96  soft-tissue]
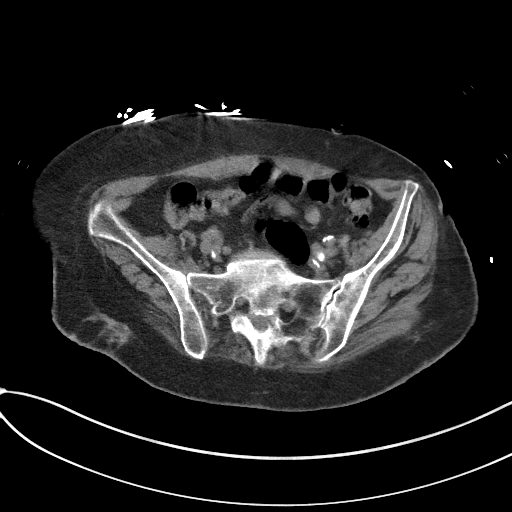
[im 44/96  soft-tissue]
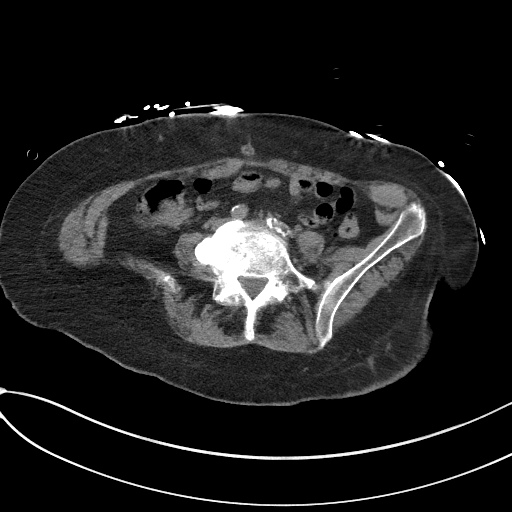
[im 52/96  soft-tissue]
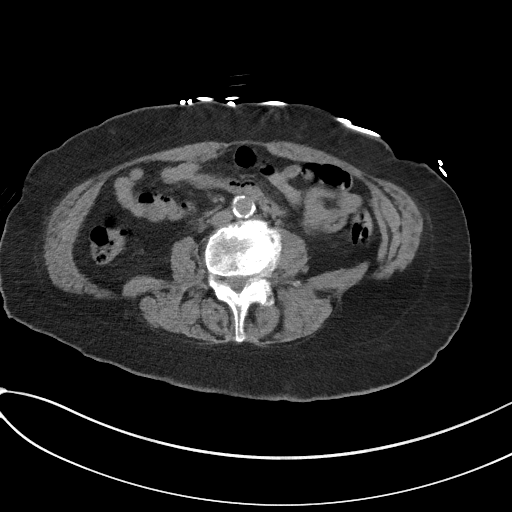
[im 60/96  soft-tissue]
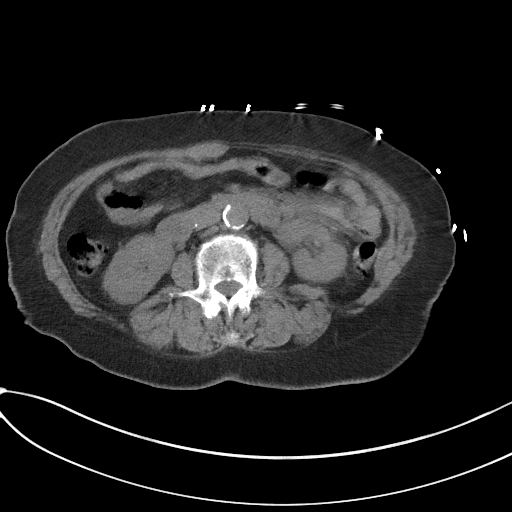
[im 68/96  soft-tissue]
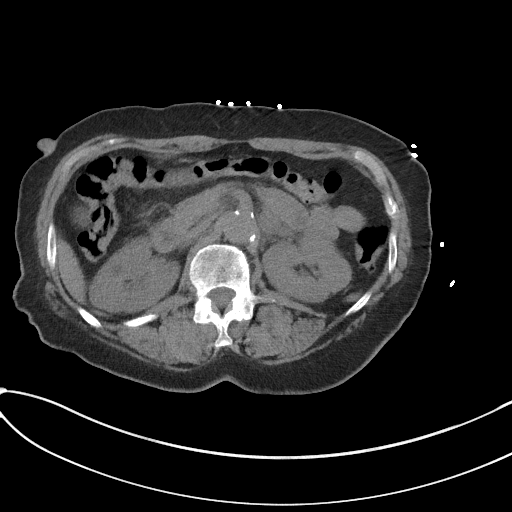
[im 68/96  bone]
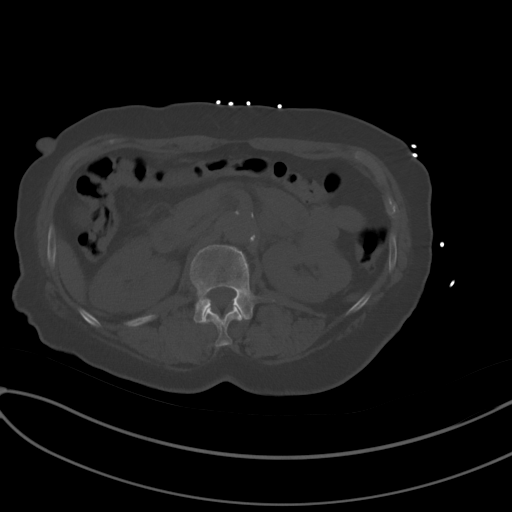
[im 76/96  soft-tissue]
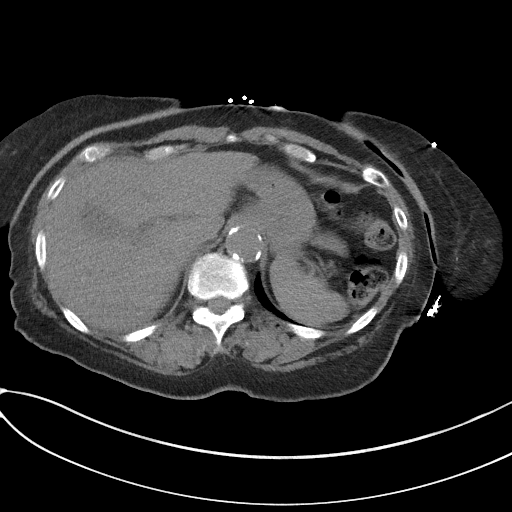
[im 84/96  soft-tissue]
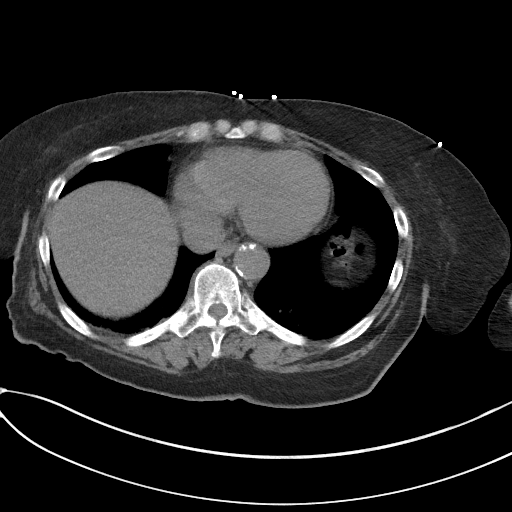
[im 92/96  soft-tissue]
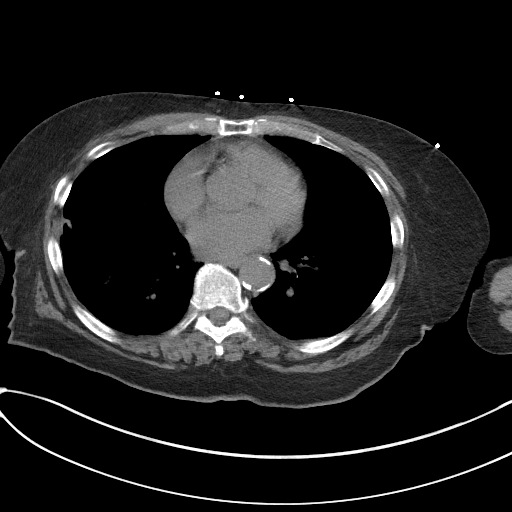

[Series 5: coronal · coronal · 0.70mm/px · 3 of 144 slices shown]
[im 48/144  soft-tissue]
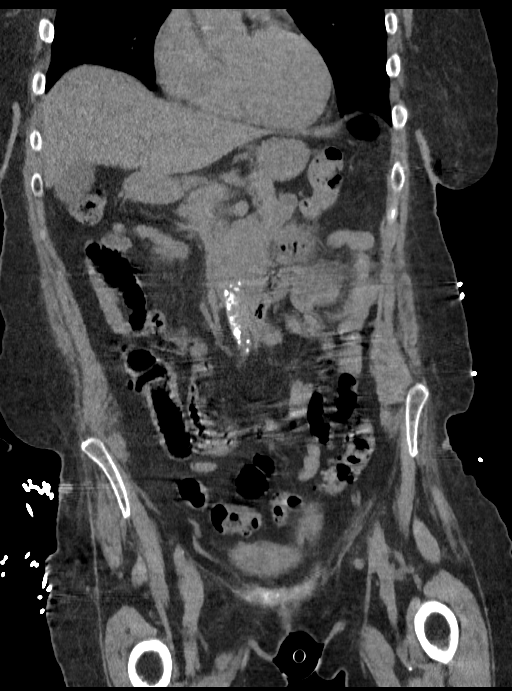
[im 64/144  soft-tissue]
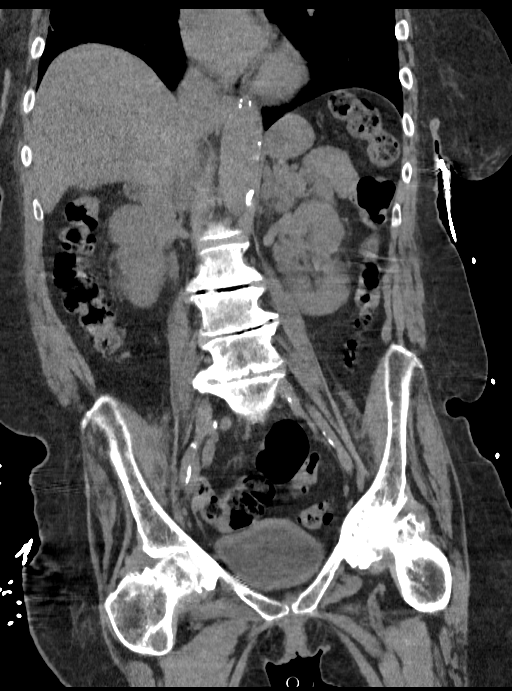
[im 80/144  soft-tissue]
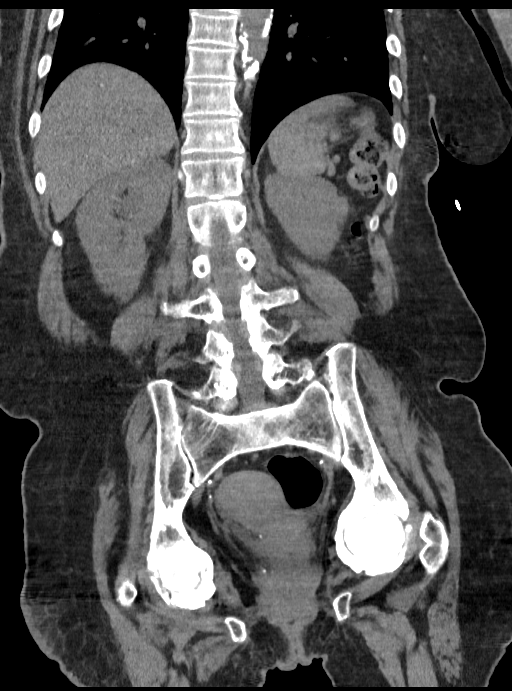

[15 of 46 positions shown; findings below may reference images not displayed]

FINDINGS: Lower chest: Mild scarring or atelectasis in the right lower lobe.
Circumflex and right coronary artery and descending thoracic aortic
atherosclerotic calcification.

Hepatobiliary: Unremarkable

Pancreas: Unremarkable

Spleen: Unremarkable

Adrenals/Urinary Tract: Low-density fullness of both adrenal glands
without mass identified. Vascular calcification along the left renal
hilum, image 74 series 5. Punctate 1-2 mm right kidney upper pole
calcification on image 84 series 5, probably a renal calculus.

Borderline right hydronephrosis without ureteral calculus or
specific cause identified.

Stomach/Bowel: Descending and sigmoid colon diverticulosis without
findings of active diverticulitis. Circumferential wall thickening
in the lower rectum extending to the anorectal junction for example
on image 77 series 2, cannot exclude inflammation or tumor,
correlate with digital rectal exam findings.

Vascular/Lymphatic: Aortoiliac atherosclerotic vascular disease.

Reproductive: Posterior uterine body fibroid.

Other: Cutaneous and subcutaneous edema posterolateral to the right
hip on image 87 series 2.

Musculoskeletal: Severe bilateral hip arthropathy with protrusio.
Lumbar spondylosis and degenerative disc disease causing multilevel
impingement. Mild grade 1 degenerative retrolisthesis at L2-3. Small
indirect right inguinal hernia contains adipose tissue.
IMPRESSION: 1. There borderline right hydronephrosis without ureteral calculus
or visible cause.
2. Punctate 1-2 mm right kidney upper pole nonobstructive renal
calculus.
3. Wall thickening in the lower rectum and anorectal junction,
inflammation or tumor not excluded.
4.  Aortic Atherosclerosis ([id number]).  Coronary atherosclerosis.
5. Descending and sigmoid colon diverticulosis.
6. Uterine fibroid.
7. Cutaneous and subcutaneous edema overlying the right hip, cause
uncertain.
8. Severe bilateral hip arthropathy with protrusio.
9. Multilevel lumbar impingement.

## 2022-08-14 IMAGING — CT CT HEAD W/O CM
3 series · 16 of 47 positions shown, 19 images · non-contrast
Comparison: 03/12/2021

CLINICAL DATA: Mental status changes

EXAM:
CT HEAD WITHOUT CONTRAST
TECHNIQUE: Contiguous axial images were obtained from the base of the skull
through the vertex without intravenous contrast.

[Series 2: head wo · axial · 0.45mm/px · z∈[+519,+659]mm · 10 of 34 slices shown, 13 images]
[im 3/34  brain]
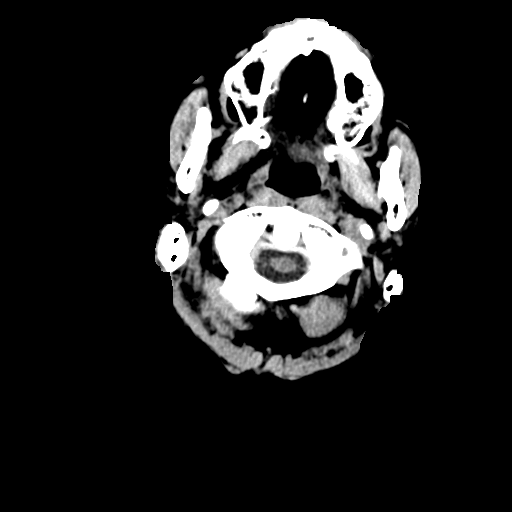
[im 3/34  bone]
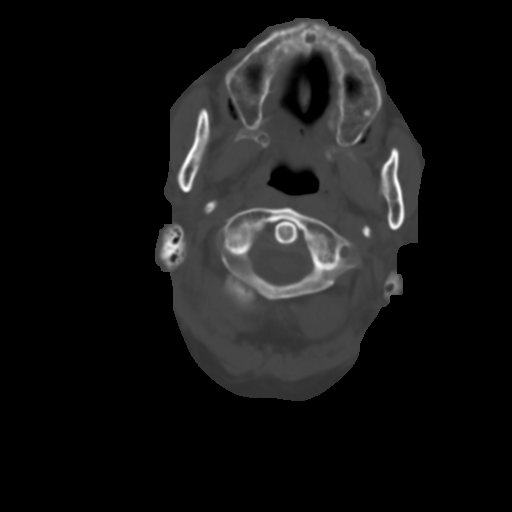
[im 6/34  brain]
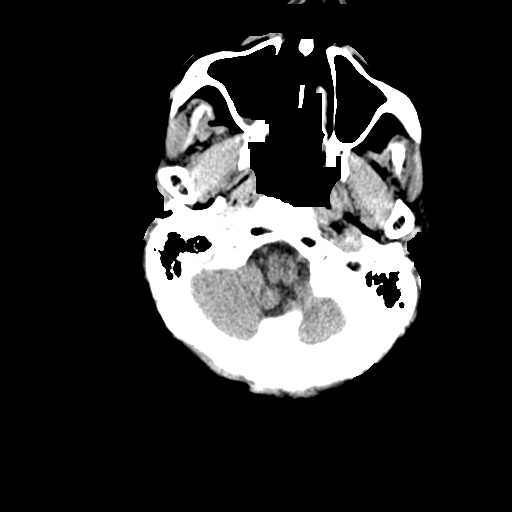
[im 10/34  brain]
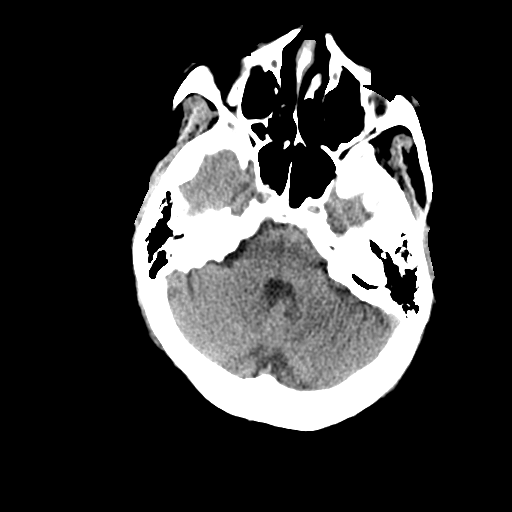
[im 12/34  brain]
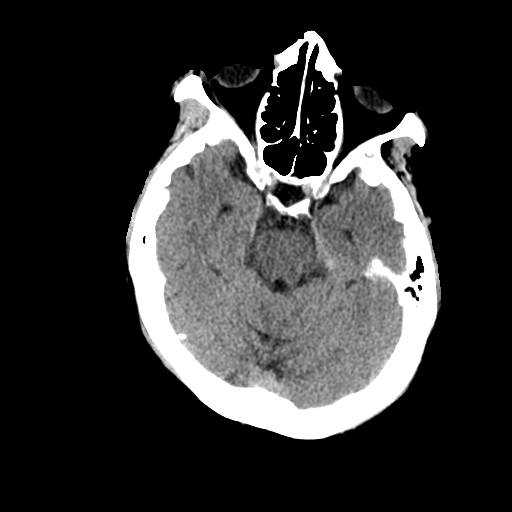
[im 15/34  brain]
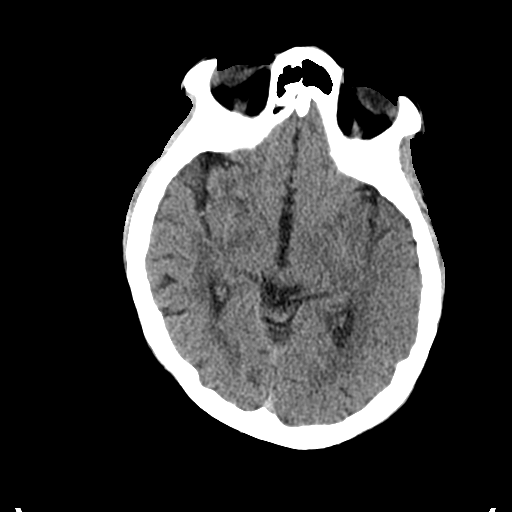
[im 15/34  bone]
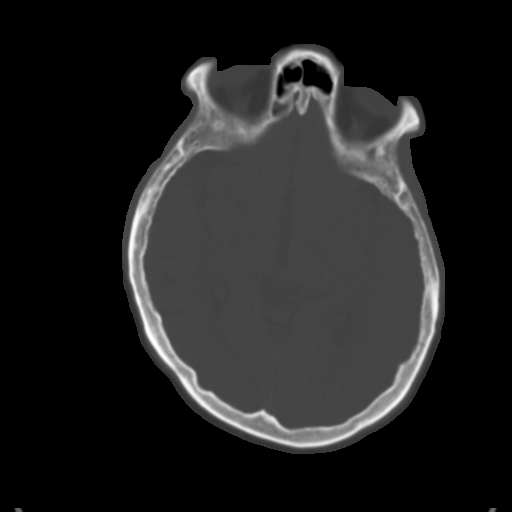
[im 19/34  brain]
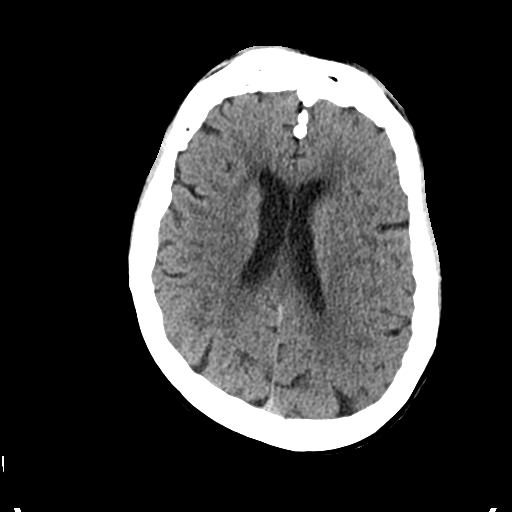
[im 22/34  brain]
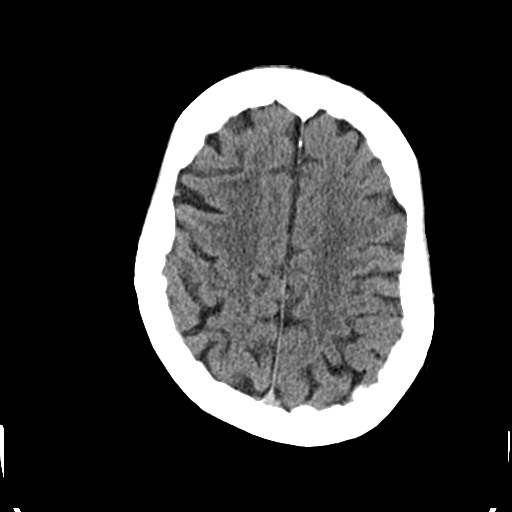
[im 26/34  brain]
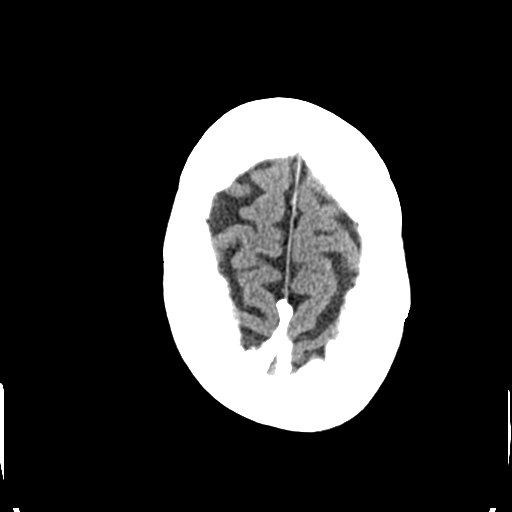
[im 28/34  brain]
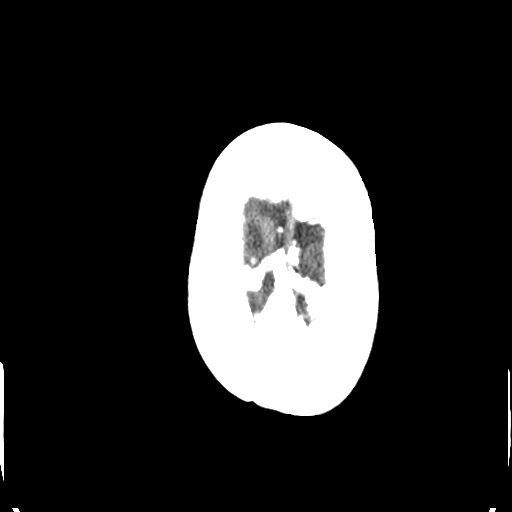
[im 28/34  bone]
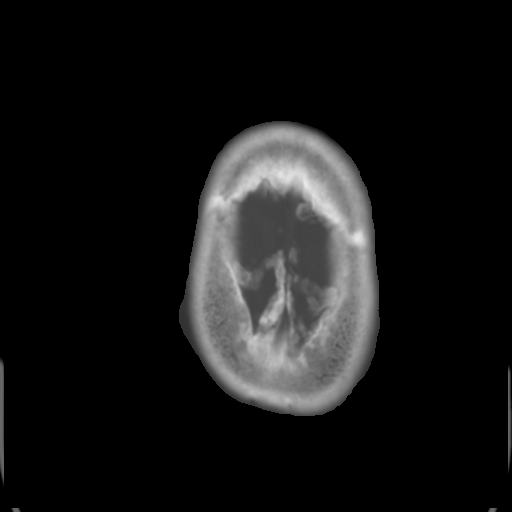
[im 31/34  brain]
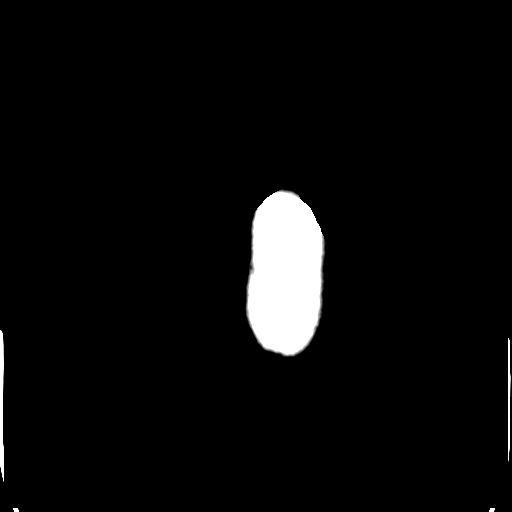

[Series 4: coronal soft tissue · coronal · 0.32mm/px · 3 of 66 slices shown]
[im 22/66  brain]
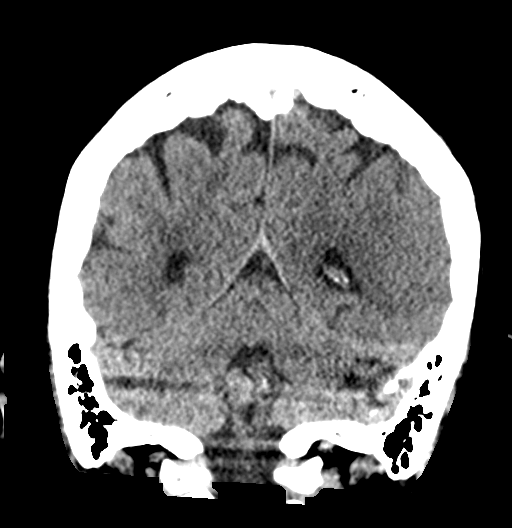
[im 29/66  brain]
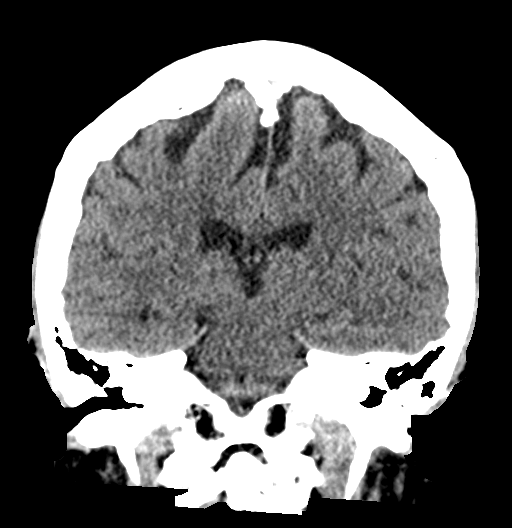
[im 37/66  brain]
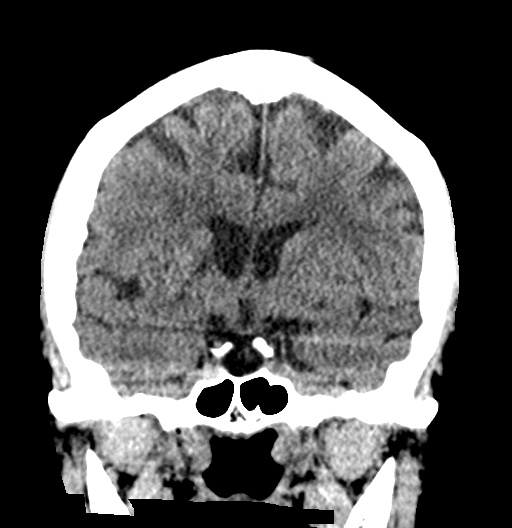

[Series 5: sagittal soft tissue · sagittal · 0.33mm/px · 3 of 55 slices shown]
[im 19/55  brain]
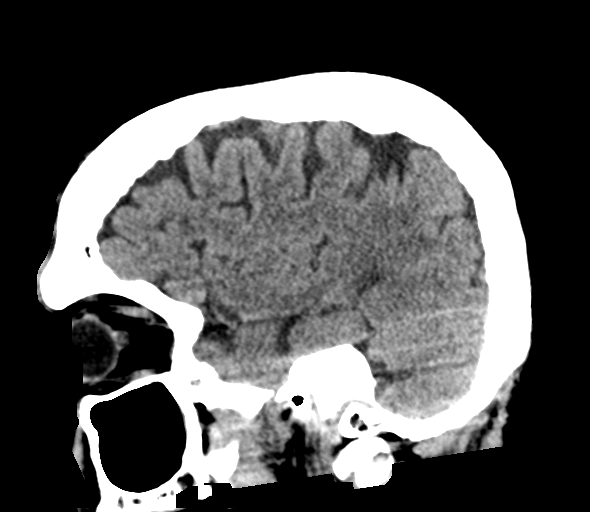
[im 28/55  brain]
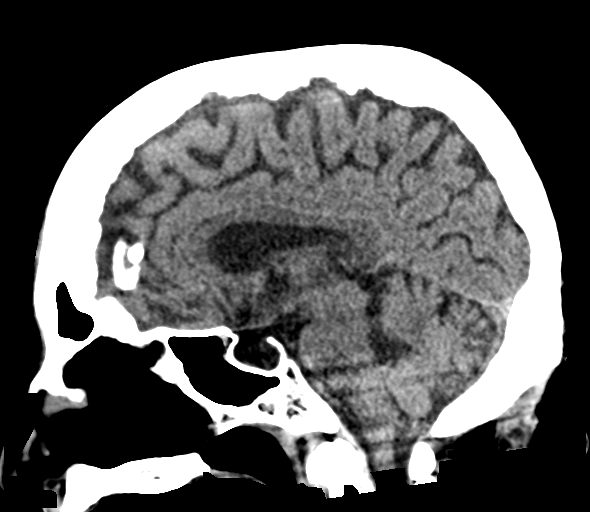
[im 37/55  brain]
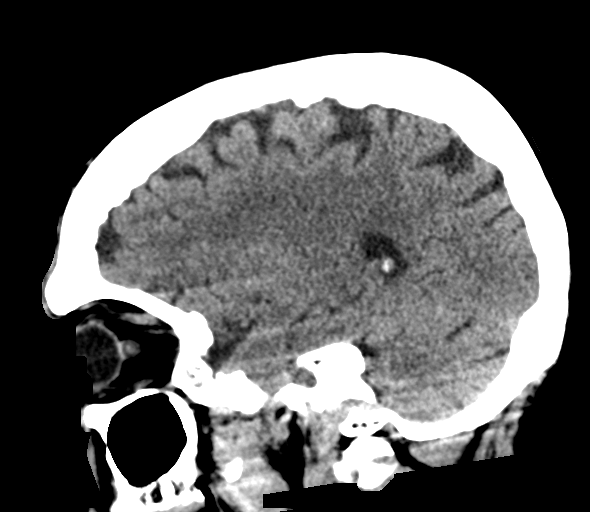

[16 of 47 positions shown; findings below may reference images not displayed]

FINDINGS: Brain: There is atrophy and chronic small vessel disease changes.
Old right basal ganglia lacunar infarct. No acute intracranial
abnormality. Specifically, no hemorrhage, hydrocephalus, mass
lesion, acute infarction, or significant intracranial injury.

Vascular: No hyperdense vessel or unexpected calcification.

Skull: No acute calvarial abnormality.

Sinuses/Orbits: Air-fluid level in the right maxillary sinus.

Other: None
IMPRESSION: No significant change since prior study.  No acute findings.
